# Patient Record
Sex: Male | Born: 1995 | Race: White | Hispanic: No | Marital: Single | Smoking: Never smoker
Health system: Southern US, Community
[De-identification: ages and names within clinical notes are randomized; demographics above are authoritative.]

## PROBLEM LIST (undated history)

## (undated) ENCOUNTER — Emergency Department: Discharge status: Absent without leave | Payer: BC Managed Care – PPO

## (undated) DIAGNOSIS — F329 Major depressive disorder, single episode, unspecified: Secondary | ICD-10-CM

## (undated) DIAGNOSIS — L709 Acne, unspecified: Secondary | ICD-10-CM

## (undated) DIAGNOSIS — F32A Depression, unspecified: Secondary | ICD-10-CM

## (undated) HISTORY — DX: Major depressive disorder, single episode, unspecified: F32.9

## (undated) HISTORY — DX: Depression, unspecified: F32.A

## (undated) HISTORY — DX: Acne, unspecified: L70.9

---
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
## 2016-08-29 NOTE — Progress Notes (Signed)
Patient ID: Kevin Ramos, male   DOB: Mar 09, 1996, 21 y.o.   MRN: [id number]            Referring physician: None  Chief complaint: Low calcium  History of Present Illness:  He has had symptoms for about 15 years of his fingers and toes getting numb and hands getting tingly along with marked cramping in his fingers and toes.  Also his eyes would be twitching He was evaluated at [facility] and was found to have low calcium levels, high phosphorus levels and very high parathyroid hormone levels as high as about 1500 Lowest calcium on record is 6.8 done in 2007 Also has had a tendency to high alkaline phosphate levels, this was 376 in 2007 and most recently 151  Although the patient has improvement in symptoms with taking calcitriol and calcium supplements he does not think that these supplements help his symptoms completely and because of lack of consistent relief he does not take his medication regularly He has not taken any calcitriol for the last 1-1/2 weeks When he does take his calcium tablets he takes them both together, does feel they make him choke sometimes  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 Depression     No past surgical history on file.  Family History  Problem Relation Age of Onset  . Hypoparathyroidism Mother     Social History:  reports that he has never smoked. He has never used smokeless tobacco. His alcohol and drug histories are not on file.   Allergies: No Known  Allergies  Allergies as of 08/30/2016   No Known Allergies     Medication List       Accurate as of 08/30/16 12:05 PM. Always use your most recent med list.          calcitRIOL 0.5 MCG capsule Commonly known as:  ROCALTROL Take by mouth.   CALCIUM 1200 PO Take 3 capsules by mouth daily.   citalopram 40 MG tablet Commonly known as:  CELEXA Take 40 mg by mouth daily.   TYLENOL ARTHRITIS PAIN 650 MG CR tablet Generic drug:  acetaminophen Take 1,300 mg by mouth every 8 (eight) hours as needed for pain.       LABS:        Review of Systems  Constitutional: Negative for weight gain.  HENT: Negative for trouble swallowing.   Gastrointestinal: Positive for constipation. Negative for abdominal pain.  Endocrine: Positive for fatigue.  Musculoskeletal: Positive for muscle aches and muscle cramps.  Psychiatric/Behavioral: Positive for depressed mood, nervousness and insomnia.     PHYSICAL EXAM:  BP 100/74   Pulse 79   Ht 5' 7.5" (1.715 m)   Wt 141 lb 3.2 oz (64 kg)   SpO2 98%   BMI 21.79 kg/m   GENERAL:  Well-built and nourished, pleasant and cooperative   No pallor, clubbing   Skin:  no rash   He has normal male secondary sexual characteristics  EYES:  Externally normal.  Fundii:  Not indicated  ENT: Oral mucosa and tongue normal.  THYROID:  Not palpable.  No lymphadenopathy in the neck  HEART:  Normal  S1 and S2; no murmur or click.  CHEST:  Normal shape.  Lungs: Vescicular breath sounds heard equally.  No crepitations/ wheeze.  ABDOMEN:  No distention.  Liver and spleen not palpable.  No other mass or tenderness.  NEUROLOGICAL: .Reflexes are difficult to elicit, normal bilaterally at ankles.  Chvostek sign significant positive Trousseau sign positive  JOINTS:  Normal.  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 Positive family history of the same  condition in his mother Symptoms have not been consistently controlled and calcium has not been normalized with previous treatment He has been somewhat erratic with his treatment and follow-up and continues to have symptoms although these are intermittent  FATIGUE: Not clear of this is related to hypocalcemia, concomitant insomnia and depression No recent thyroid levels available  HYPOKALEMIA: He had a potassium of 3.4 last year, not further evaluated and etiology unclear   PLAN:    Recheck chemistry panel today including calcium and potassium  Check magnesium level  Check vitamin D level  Check thyroid levels  He will increase his calcitriol to 5 capsules of the 0.5 g instead of 3 capsules, emphasized the need to take this every single day consistently to provide relief from his symptoms and normalize calcium with benefits of normal bone health long-term also  He will take to calcium tablets twice a day  Treatment will be adjusted based on his labs today  Follow-up in 3 weeks with repeat metabolic panel    Vaida Kerchner 08/30/2016, 12:05 PM

## 2016-08-30 ENCOUNTER — Encounter: Payer: Self-pay | Admitting: Endocrinology

## 2016-08-30 ENCOUNTER — Telehealth: Payer: Self-pay

## 2016-08-30 ENCOUNTER — Ambulatory Visit (INDEPENDENT_AMBULATORY_CARE_PROVIDER_SITE_OTHER): Payer: BC Managed Care – PPO | Admitting: Endocrinology

## 2016-08-30 VITALS — BP 100/74 | HR 79 | Ht 67.5 in | Wt 141.2 lb

## 2016-08-30 DIAGNOSIS — E201 Pseudohypoparathyroidism: Secondary | ICD-10-CM | POA: Diagnosis not present

## 2016-08-30 DIAGNOSIS — E876 Hypokalemia: Secondary | ICD-10-CM

## 2016-08-30 DIAGNOSIS — R5383 Other fatigue: Secondary | ICD-10-CM

## 2016-08-30 LAB — VITAMIN D 25 HYDROXY (VIT D DEFICIENCY, FRACTURES): VITD: 17.83 ng/mL — ABNORMAL LOW (ref 30.00–100.00)

## 2016-08-30 LAB — COMPREHENSIVE METABOLIC PANEL
ALT: 13 U/L (ref 0–53)
AST: 15 U/L (ref 0–37)
Albumin: 4.7 g/dL (ref 3.5–5.2)
Alkaline Phosphatase: 159 U/L — ABNORMAL HIGH (ref 39–117)
BUN: 7 mg/dL (ref 6–23)
CHLORIDE: 97 meq/L (ref 96–112)
CO2: 33 mEq/L — ABNORMAL HIGH (ref 19–32)
CREATININE: 0.76 mg/dL (ref 0.40–1.50)
Calcium: 7.5 mg/dL — ABNORMAL LOW (ref 8.4–10.5)
GFR: 134.98 mL/min (ref >60.00)
Glucose, Bld: 90 mg/dL (ref 70–99)
POTASSIUM: 3.4 meq/L — AB (ref 3.5–5.1)
Sodium: 139 mEq/L (ref 135–145)
Total Bilirubin: 1.4 mg/dL — ABNORMAL HIGH (ref 0.2–1.2)
Total Protein: 7.6 g/dL (ref 6.0–8.3)

## 2016-08-30 LAB — MAGNESIUM: MAGNESIUM: 2 mg/dL (ref 1.5–2.5)

## 2016-08-30 LAB — T4, FREE: Free T4: 0.84 ng/dL (ref 0.60–1.60)

## 2016-08-30 LAB — TSH: TSH: 3.29 u[IU]/mL (ref 0.35–4.50)

## 2016-08-30 NOTE — Telephone Encounter (Signed)
[facility] to have records faxed to [place] at [phone]. No answer so faxed request.

## 2016-08-30 NOTE — Progress Notes (Signed)
Please call to let patient know that the calcium is 7.5, he can try taking 4 capsules of calcitriol 0.5 g daily, we can send new prescription if needed Also needs to take 2 tablets of calcium twice a day as directed Vitamin D level was very low and we need to prescribe 50,000 units of vitamin D weekly Also need to start Klor-Con 10 mEq daily for low potassium Thyroid and magnesium okay

## 2016-08-30 NOTE — Patient Instructions (Signed)
Calcium: 2 tablets twice a day  CALCITRIOL: Take 5 capsules daily either at once or in split doses  Make sure you get plenty of day products in your diet

## 2016-08-31 ENCOUNTER — Telehealth: Payer: Self-pay | Admitting: Endocrinology

## 2016-08-31 NOTE — Telephone Encounter (Signed)
Patient returning the call about lab results. Please call patient back at [phone] to advise.

## 2016-09-03 ENCOUNTER — Other Ambulatory Visit: Payer: Self-pay

## 2016-09-03 ENCOUNTER — Other Ambulatory Visit: Payer: Self-pay | Admitting: Endocrinology

## 2016-09-03 MED ORDER — CALCITRIOL 0.5 MCG PO CAPS: ORAL_CAPSULE | ORAL | 1 refills | Status: DC

## 2016-09-03 MED ORDER — VITAMIN D (ERGOCALCIFEROL) 1.25 MG (50000 UNIT) PO CAPS: 50000.0000 [IU] | ORAL_CAPSULE | ORAL | 1 refills | Status: DC

## 2016-09-03 MED ORDER — POTASSIUM CHLORIDE ER 10 MEQ PO TBCR: 10.0000 meq | EXTENDED_RELEASE_TABLET | Freq: Every day | ORAL | 2 refills | Status: DC

## 2016-09-03 NOTE — Telephone Encounter (Signed)
Called patient and gave him lab results and sent in his new prescriptions to his pharmacy.

## 2016-09-05 ENCOUNTER — Other Ambulatory Visit: Payer: Self-pay

## 2016-09-05 MED ORDER — VITAMIN D (ERGOCALCIFEROL) 1.25 MG (50000 UNIT) PO CAPS: 50000.0000 [IU] | ORAL_CAPSULE | ORAL | 1 refills | Status: DC

## 2016-09-20 ENCOUNTER — Other Ambulatory Visit (INDEPENDENT_AMBULATORY_CARE_PROVIDER_SITE_OTHER): Payer: BC Managed Care – PPO

## 2016-09-20 DIAGNOSIS — E201 Pseudohypoparathyroidism: Secondary | ICD-10-CM

## 2016-09-20 LAB — BASIC METABOLIC PANEL
BUN: 9 mg/dL (ref 6–23)
CALCIUM: 9.4 mg/dL (ref 8.4–10.5)
CO2: 30 meq/L (ref 19–32)
Chloride: 100 mEq/L (ref 96–112)
Creatinine, Ser: 0.9 mg/dL (ref 0.40–1.50)
GFR: 111 mL/min (ref >60.00)
GLUCOSE: 96 mg/dL (ref 70–99)
POTASSIUM: 3.6 meq/L (ref 3.5–5.1)
SODIUM: 138 meq/L (ref 135–145)

## 2016-09-25 ENCOUNTER — Ambulatory Visit (INDEPENDENT_AMBULATORY_CARE_PROVIDER_SITE_OTHER): Payer: BC Managed Care – PPO | Admitting: Endocrinology

## 2016-09-25 ENCOUNTER — Encounter: Payer: Self-pay | Admitting: Endocrinology

## 2016-09-25 VITALS — BP 102/68 | HR 92 | Ht 67.5 in | Wt 148.4 lb

## 2016-09-25 DIAGNOSIS — E201 Pseudohypoparathyroidism: Secondary | ICD-10-CM | POA: Diagnosis not present

## 2016-09-25 NOTE — Patient Instructions (Signed)
1 calcium am and 2 at dinner  4 Calcitriol daily

## 2016-09-25 NOTE — Progress Notes (Signed)
Patient ID: Kevin Ramos, male   DOB: 01-04-1996, 21 y.o.   MRN: [id number]             Chief complaint: Low calcium, follow-up  History of Present Illness:  Initial history obtained in 6/18: He has had symptoms for about 15 years of his fingers and toes getting numb and hands getting tingly along with marked cramping in his fingers and toes.  Also his eyes would be twitching He was evaluated at [facility] and was found to have low calcium levels, high phosphorus levels and very high parathyroid hormone levels as high as about 1500 Lowest calcium on record is 6.8 done in 2007 Also has had a tendency to high alkaline phosphate levels, this was 376 in 2007 and most recently 151 Although the patient has improvement in symptoms with taking calcitriol and calcium supplements he does not think that these supplements help his symptoms completely and because of lack of consistent relief he does not take his medication regularly He has not taken any calcitriol for the last 1-1/2 weeks When he does take his calcium tablets he takes them both together, does feel they make him choke sometimes 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 Usually his symptoms appear to be intermittent Also he complains of feeling exhausted and getting more tired at times  His last calcium was 7.1 done in 10/17 by his PCP Highest calcium has been 8.3 in 2012, but he took twice the prescribed amount of calcitriol the night before  RECENT history:  At his initial consultation he was recommended that he try to take adequate doses of calcitriol and calcium consistently to help control hypocalcemia and explained to him that because of his relatively severe condition he needs to take larger than usual doses of both calcium and calcitriol regularly Also he was found to have vitamin D deficiency and started on vitamin D supplements 50,000  units weekly  Although he was told to take 5 calcitriol capsules he is taking only 4 but also has increased his calcium to 2 tablets twice a day without difficulty More recently he says that he has not had any cramping, stiffness, weakness or physical exhaustion even when he has been walking at times He does have more significant depression recently which makes him tired  Calcium is now improved back to normal  Lab Results  Component Value Date   CALCIUM 9.4 09/20/2016     Lab Results  Component Value Date   K 3.6 09/20/2016     Lab Results  Component Value Date   VD25OH 17.83 (L) 08/30/2016     Past Medical History:  Diagnosis Date  . Depression     No past surgical history on file.  Family History  Problem Relation Age of Onset  . Hypoparathyroidism Mother     Social History:  reports that he has never smoked. He has never used smokeless tobacco. He reports that he does not use drugs. His alcohol history is not on file.   Allergies: No Known Allergies  Allergies as of 09/25/2016   No Known Allergies     Medication List       Accurate as of 09/25/16  4:16 PM. Always use your most recent med list.          buPROPion 150 MG 24 hr tablet Commonly known as:  WELLBUTRIN XL Take 150 mg  by mouth daily.   calcitRIOL 0.5 MCG capsule Commonly known as:  ROCALTROL Take 4 capsules of calcitriol 0.5 g daily   CALCIUM 1200 PO Take 4 capsules by mouth daily.   cetirizine 10 MG tablet Commonly known as:  ZYRTEC Take 10 mg by mouth daily.   potassium chloride 10 MEQ tablet Commonly known as:  KLOR-CON 10 Take 1 tablet (10 mEq total) by mouth daily.   traZODone 50 MG tablet Commonly known as:  DESYREL Take 100 mg by mouth at bedtime.   TYLENOL ARTHRITIS PAIN 650 MG CR tablet Generic drug:  acetaminophen Take 1,300 mg by mouth every 8 (eight) hours as needed for pain.   Vitamin D (Ergocalciferol) 50000 units Caps capsule Commonly known as:  DRISDOL Take 1  capsule (50,000 Units total) by mouth every 7 (seven) days.              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  Patient symptoms and calcium levels are now currently well controlled with taking calcitriol for capsules daily along with increased calcium tablets Also started on vitamin D level separately because of baseline level of 17 He seems to be more motivated to take his supplements and calcium regularly now  FATIGUE: More likely related to depression  HYPOKALEMIA: He had a potassium of 3.4 and with supplementation this is improved, no etiology clear for this   PLAN:    Continue same regimen except he can cut back calcium to 3 tablets, calcium level of about 9.0 would be ideal  Continue vitamin D and potassium supplements  Call if having recurrence of symptoms  Follow-up in 6-8 weeks with repeat metabolic panel    Walterine Amodei 09/25/2016, 4:16 PM

## 2016-11-05 ENCOUNTER — Other Ambulatory Visit: Payer: Self-pay | Admitting: Endocrinology

## 2016-11-22 ENCOUNTER — Other Ambulatory Visit (INDEPENDENT_AMBULATORY_CARE_PROVIDER_SITE_OTHER): Payer: BC Managed Care – PPO

## 2016-11-22 DIAGNOSIS — E201 Pseudohypoparathyroidism: Secondary | ICD-10-CM | POA: Diagnosis not present

## 2016-11-22 LAB — BASIC METABOLIC PANEL
BUN: 12 mg/dL (ref 6–23)
CALCIUM: 11 mg/dL — AB (ref 8.4–10.5)
CO2: 34 meq/L — AB (ref 19–32)
CREATININE: 1.11 mg/dL (ref 0.40–1.50)
Chloride: 102 mEq/L (ref 96–112)
GFR: 87.01 mL/min (ref >60.00)
Glucose, Bld: 97 mg/dL (ref 70–99)
Potassium: 4 mEq/L (ref 3.5–5.1)
Sodium: 140 mEq/L (ref 135–145)

## 2016-11-23 DIAGNOSIS — Z206 Contact with and (suspected) exposure to human immunodeficiency virus [HIV]: Secondary | ICD-10-CM | POA: Insufficient documentation

## 2016-11-28 ENCOUNTER — Encounter: Payer: Self-pay | Admitting: Endocrinology

## 2016-11-28 ENCOUNTER — Ambulatory Visit (INDEPENDENT_AMBULATORY_CARE_PROVIDER_SITE_OTHER): Payer: BC Managed Care – PPO | Admitting: Endocrinology

## 2016-11-28 VITALS — BP 108/72 | HR 96 | Ht 67.5 in | Wt 161.8 lb

## 2016-11-28 DIAGNOSIS — E559 Vitamin D deficiency, unspecified: Secondary | ICD-10-CM | POA: Diagnosis not present

## 2016-11-28 DIAGNOSIS — E201 Pseudohypoparathyroidism: Secondary | ICD-10-CM

## 2016-11-28 NOTE — Patient Instructions (Addendum)
Take 2 each of calcitriol and calcium  Skip tomorrow

## 2016-11-28 NOTE — Progress Notes (Signed)
Patient ID: Kevin Ramos, male   DOB: [birth year], 21 y.o.   MRN: [id number]             Chief complaint: Low calcium, follow-up  History of Present Illness:  Initial history obtained in 6/18: He has had symptoms for about 15 years of his fingers and toes getting numb and hands getting tingly along with marked cramping in his fingers and toes.  Also his eyes would be twitching He was evaluated at [facility] and was found to have low calcium levels, high phosphorus levels and very high parathyroid hormone levels as high as about 1500 Lowest calcium on record is 6.8 done in 2007 Also has had a tendency to high alkaline phosphate levels, this was 376 in 2007 and most recently 151 Although the patient has improvement in symptoms with taking calcitriol and calcium supplements he does not think that these supplements help his symptoms completely and because of lack of consistent relief he does not take his medication regularly He has not taken any calcitriol for the last 1-1/2 weeks When he does take his calcium tablets he takes them both together, does feel they make him choke sometimes 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 Usually his symptoms appear to be intermittent Also he complains of feeling exhausted and getting more tired at times  His last calcium was 7.1 done in 10/17 by his PCP Highest calcium has been 8.3 in 2012, but he took twice the prescribed amount of calcitriol the night before  RECENT history:  At his initial consultation he was recommended that he try to take adequate doses of calcitriol and calcium consistently to help control hypocalcemia and explained to him that because of his relatively severe condition he needs to take larger than usual doses of both calcium and calcitriol regularly Also he was found to have vitamin D deficiency and started on vitamin D supplements 50,000  units weekly  Although he was told to take 5 calcitriol capsules he is taking only Currently is taking calcitriol regularly, now taking 4 capsules daily in the morning  He also has increased his calcium to 3 He has done very well with being compliant with all his regimen of taking medications  He has no further cramping, stiffness, weakness or extreme weakness that he had before He does have better energy after adjusting his psychiatric medications  Calcium is now markedly increased at 11.0 compared to his previous levels He does not feel nauseous    Lab Results  Component Value Date   CALCIUM 11.0 (H) 11/22/2016   CALCIUM 9.4 09/20/2016   CALCIUM 7.5 (L) 08/30/2016     He was also supplemented with potassium because of relatively low levels  Lab Results  Component Value Date   K 4.0 11/22/2016   VITAMIN D deficiency: He is taking 50,000 units weekly since June  Lab Results  Component Value Date   VD25OH 17.83 (L) 08/30/2016     Past Medical History:  Diagnosis Date  . Depression     No past surgical history on file.  Family History  Problem Relation Age of Onset  . Hypoparathyroidism Mother     Social History:  reports that he has never smoked. He has never used smokeless tobacco. He reports that he does not use drugs. His alcohol history is not on file.   Allergies: No Known Allergies  Allergies  as of 11/28/2016   No Known Allergies     Medication List       Accurate as of 11/28/16  3:43 PM. Always use your most recent med list.          calcitRIOL 0.5 MCG capsule Commonly known as:  ROCALTROL TAKE FOUR CAPSULES BY MOUTH DAILY   CALCIUM 1200 PO Take 4 capsules by mouth daily.   cetirizine 10 MG tablet Commonly known as:  ZYRTEC Take 10 mg by mouth daily.   citalopram 40 MG tablet Commonly known as:  CELEXA Take by mouth.   Doxycycline Hyclate 50 MG Tabs TAKE ONE tablet by MOUTH ONCE daily, TAKE WITH food AND lots OF IN FLUID     lamoTRIgine 25 MG tablet Commonly known as:  LAMICTAL   potassium chloride 10 MEQ tablet Commonly known as:  KLOR-CON 10 Take 1 tablet (10 mEq total) by mouth daily.   traZODone 50 MG tablet Commonly known as:  DESYREL Take 100 mg by mouth at bedtime.   TRUVADA 200-300 MG tablet Generic drug:  emtricitabine-tenofovir   TYLENOL ARTHRITIS PAIN 650 MG CR tablet Generic drug:  acetaminophen Take 1,300 mg by mouth every 8 (eight) hours as needed for pain.   Vitamin D (Ergocalciferol) 50000 units Caps capsule Commonly known as:  DRISDOL Take 1 capsule (50,000 Units total) by mouth every 7 (seven) days.              Review of Systems  Neurological: Positive for tingling.   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  Patient  His symptoms have resolved with taking his calcitriol and calcium regularly and he is much more motivated to be compliant with this He is also on vitamin D supplementation  Surprisingly his calcium level was increased from 9.4 up to 11 Currently does not have any symptoms of hypercalcemia   HYPOKALEMIA: He had a potassium of 3.4 and with supplementation this is improved   PLAN:    He will reduce calcitriol down 50% and take only 2 capsules daily instead of before  Also reduce calcium supplements to 2 daily  Will check his levels again in one month and also recheck vitamin D levels  He will call if he has any recurrence of hypocalcemia and symptoms Continue potassium supplements for now   [facility] 11/28/2016, 3:43 PM

## 2016-12-02 ENCOUNTER — Other Ambulatory Visit: Payer: Self-pay | Admitting: Endocrinology

## 2016-12-27 ENCOUNTER — Other Ambulatory Visit (INDEPENDENT_AMBULATORY_CARE_PROVIDER_SITE_OTHER): Payer: BC Managed Care – PPO

## 2016-12-27 DIAGNOSIS — E559 Vitamin D deficiency, unspecified: Secondary | ICD-10-CM | POA: Diagnosis not present

## 2016-12-27 DIAGNOSIS — E201 Pseudohypoparathyroidism: Secondary | ICD-10-CM | POA: Diagnosis not present

## 2016-12-27 LAB — VITAMIN D 25 HYDROXY (VIT D DEFICIENCY, FRACTURES): VITD: 41.22 ng/mL (ref 30.00–100.00)

## 2016-12-27 LAB — RENAL FUNCTION PANEL
Albumin: 4.4 g/dL (ref 3.5–5.2)
BUN: 12 mg/dL (ref 6–23)
CHLORIDE: 102 meq/L (ref 96–112)
CO2: 31 mEq/L (ref 19–32)
Calcium: 9.9 mg/dL (ref 8.4–10.5)
Creatinine, Ser: 1.06 mg/dL (ref 0.40–1.50)
GFR: 91.69 mL/min (ref >60.00)
GLUCOSE: 91 mg/dL (ref 70–99)
PHOSPHORUS: 2.9 mg/dL (ref 2.3–4.6)
POTASSIUM: 4.1 meq/L (ref 3.5–5.1)
SODIUM: 139 meq/L (ref 135–145)

## 2016-12-31 ENCOUNTER — Ambulatory Visit (INDEPENDENT_AMBULATORY_CARE_PROVIDER_SITE_OTHER): Payer: BC Managed Care – PPO | Admitting: Endocrinology

## 2016-12-31 ENCOUNTER — Encounter: Payer: Self-pay | Admitting: Endocrinology

## 2016-12-31 VITALS — BP 116/74 | HR 94 | Ht 67.5 in | Wt 165.6 lb

## 2016-12-31 DIAGNOSIS — E201 Pseudohypoparathyroidism: Secondary | ICD-10-CM

## 2016-12-31 NOTE — Patient Instructions (Addendum)
Stop Potassium   Vit. D 2x per month

## 2016-12-31 NOTE — Progress Notes (Signed)
Patient ID: Kevin Ramos, male   DOB: January 07, 1996, 21 y.o.   MRN: [id number]             Chief complaint: Low calcium, follow-up  History of Present Illness:  Initial history obtained in 6/18: He has had symptoms for about 15 years of his fingers and toes getting numb and hands getting tingly along with marked cramping in his fingers and toes.  Also his eyes would be twitching He was evaluated at [facility] and was found to have low calcium levels, high phosphorus levels and very high parathyroid hormone levels as high as about 1500 Lowest calcium on record is 6.8 done in 2007 Also has had a tendency to high alkaline phosphate levels, this was 376 in 2007 and most recently 151 Although the patient has improvement in symptoms with taking calcitriol and calcium supplements he does not think that these supplements help his symptoms completely and because of lack of consistent relief he does not take his medication regularly He has not taken any calcitriol for the last 1-1/2 weeks When he does take his calcium tablets he takes them both together, does feel they make him choke sometimes 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 Usually his symptoms appear to be intermittent Also he complains of feeling exhausted and getting more tired at times  His last calcium was 7.1 done in 10/17 by his PCP Highest calcium has been 8.3 in 2012, but he took twice the prescribed amount of calcitriol the night before  RECENT history:  At his initial consultation he was recommended that he try to take adequate doses of calcitriol and calcium consistently to help control hypocalcemia and explained to him that because of his relatively severe condition he needs to take larger than usual doses of both calcium and calcitriol regularly Also he was found to have vitamin D deficiency and started on vitamin D supplements 50,000  units weekly  With starting more regular supplementation his calcium levels improved and in August the level was up to 11 His dose was cut down by 50% and he is  taking a lower dose of calcium also  Recently is taking calcitriol regularly every day, now taking 2 capsules daily in the morning  He also has been taking calcium twice a day He has done very well with being compliant with all his regimen of taking medications  He has no further cramping, stiffness, weakness or extreme weakness that he had before He may have little tingling in his hands if he is lying on the  He does have better energy overall but still having issues with his depression  Calcium is now normal at 9.9    Lab Results  Component Value Date   CALCIUM 9.9 12/27/2016   CALCIUM 11.0 (H) 11/22/2016   CALCIUM 9.4 09/20/2016     He also is being supplemented with potassium because of relatively low levels, No history of hypomagnesemia   Lab Results  Component Value Date   K 4.1 12/27/2016    VITAMIN D deficiency: He is taking 50,000 units weekly since June  Lab Results  Component Value Date   VD25OH 41.22 12/27/2016   VD25OH 17.83 (L) 08/30/2016     Past Medical History:  Diagnosis Date  . Depression     No past surgical history on file.  Family History  Problem Relation Age of Onset  .  Hypoparathyroidism Mother     Social History:  reports that he has never smoked. He has never used smokeless tobacco. He reports that he does not use drugs. His alcohol history is not on file.   Allergies: No Known Allergies  Allergies as of 12/31/2016   No Known Allergies     Medication List       Accurate as of 12/31/16 11:45 AM. Always use your most recent med list.          calcitRIOL 0.5 MCG capsule Commonly known as:  ROCALTROL Take 2 capsules daily   CALCIUM 1200 PO Take 4 capsules by mouth daily.   cetirizine 10 MG tablet Commonly known as:  ZYRTEC Take 10 mg by mouth daily.     citalopram 40 MG tablet Commonly known as:  CELEXA Take by mouth.   Doxycycline Hyclate 50 MG Tabs TAKE ONE tablet by MOUTH ONCE daily, TAKE WITH food AND lots OF IN FLUID   lamoTRIgine 150 MG tablet Commonly known as:  LAMICTAL Take 150 mg by mouth daily.   potassium chloride 10 MEQ tablet Commonly known as:  K-DUR TAKE ONE TABLET BY MOUTH DAILY   traZODone 50 MG tablet Commonly known as:  DESYREL Take 100 mg by mouth at bedtime.   TRUVADA 200-300 MG tablet Generic drug:  emtricitabine-tenofovir   TYLENOL ARTHRITIS PAIN 650 MG CR tablet Generic drug:  acetaminophen Take 1,300 mg by mouth every 8 (eight) hours as needed for pain.   Vitamin D (Ergocalciferol) 50000 units Caps capsule Commonly known as:  DRISDOL Take 1 capsule (50,000 Units total) by mouth every 7 (seven) days.              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

## 2017-03-29 ENCOUNTER — Other Ambulatory Visit: Payer: Self-pay | Admitting: Endocrinology

## 2017-05-03 ENCOUNTER — Other Ambulatory Visit: Payer: BC Managed Care – PPO

## 2017-05-07 ENCOUNTER — Ambulatory Visit: Payer: BC Managed Care – PPO | Admitting: Endocrinology

## 2017-10-29 ENCOUNTER — Encounter: Payer: Self-pay | Admitting: Emergency Medicine

## 2017-10-29 ENCOUNTER — Inpatient Hospital Stay
Admission: AD | Admit: 2017-10-29 | Discharge: 2017-10-31 | DRG: 885 | Disposition: A | Discharge status: Home / Self Care | Payer: BC Managed Care – PPO | Source: Intra-hospital | Attending: Psychiatry | Admitting: Psychiatry

## 2017-10-29 ENCOUNTER — Emergency Department
Admission: EM | Admit: 2017-10-29 | Discharge: 2017-10-29 | Disposition: A | Discharge status: Home / Self Care | Payer: BC Managed Care – PPO | Attending: Emergency Medicine | Admitting: Emergency Medicine

## 2017-10-29 ENCOUNTER — Other Ambulatory Visit: Payer: Self-pay

## 2017-10-29 DIAGNOSIS — E201 Pseudohypoparathyroidism: Secondary | ICD-10-CM | POA: Diagnosis present

## 2017-10-29 DIAGNOSIS — Z818 Family history of other mental and behavioral disorders: Secondary | ICD-10-CM | POA: Diagnosis not present

## 2017-10-29 DIAGNOSIS — F332 Major depressive disorder, recurrent severe without psychotic features: Secondary | ICD-10-CM | POA: Diagnosis present

## 2017-10-29 DIAGNOSIS — Z91199 Patient's noncompliance with other medical treatment and regimen due to unspecified reason: Secondary | ICD-10-CM

## 2017-10-29 DIAGNOSIS — Z79899 Other long term (current) drug therapy: Secondary | ICD-10-CM | POA: Insufficient documentation

## 2017-10-29 DIAGNOSIS — Z9119 Patient's noncompliance with other medical treatment and regimen: Secondary | ICD-10-CM | POA: Diagnosis not present

## 2017-10-29 DIAGNOSIS — F329 Major depressive disorder, single episode, unspecified: Secondary | ICD-10-CM | POA: Insufficient documentation

## 2017-10-29 DIAGNOSIS — F32A Depression, unspecified: Secondary | ICD-10-CM

## 2017-10-29 DIAGNOSIS — R45851 Suicidal ideations: Secondary | ICD-10-CM | POA: Insufficient documentation

## 2017-10-29 DIAGNOSIS — F410 Panic disorder [episodic paroxysmal anxiety] without agoraphobia: Secondary | ICD-10-CM | POA: Diagnosis present

## 2017-10-29 LAB — URINE DRUG SCREEN, QUALITATIVE (ARMC ONLY)
AMPHETAMINES, UR SCREEN: NOT DETECTED
Barbiturates, Ur Screen: NOT DETECTED
COCAINE METABOLITE, UR ~~LOC~~: NOT DETECTED
Cannabinoid 50 Ng, Ur ~~LOC~~: NOT DETECTED
MDMA (ECSTASY) UR SCREEN: NOT DETECTED
Methadone Scn, Ur: NOT DETECTED
Opiate, Ur Screen: NOT DETECTED
PHENCYCLIDINE (PCP) UR S: NOT DETECTED
Tricyclic, Ur Screen: NOT DETECTED

## 2017-10-29 LAB — COMPREHENSIVE METABOLIC PANEL
ALBUMIN: 4.7 g/dL (ref 3.5–5.0)
ALK PHOS: 166 U/L — AB (ref 38–126)
ALT: 20 U/L (ref 0–44)
AST: 27 U/L (ref 15–41)
Anion gap: 10 (ref 5–15)
BILIRUBIN TOTAL: 1.3 mg/dL — AB (ref 0.3–1.2)
BUN: 6 mg/dL (ref 6–20)
CO2: 31 mmol/L (ref 22–32)
Calcium: 7.5 mg/dL — ABNORMAL LOW (ref 8.9–10.3)
Chloride: 98 mmol/L (ref 98–111)
Creatinine, Ser: 0.71 mg/dL (ref 0.61–1.24)
GFR calc Af Amer: >60 mL/min (ref >60)
GFR calc non Af Amer: >60 mL/min (ref >60)
GLUCOSE: 80 mg/dL (ref 70–99)
POTASSIUM: 2.8 mmol/L — AB (ref 3.5–5.1)
Sodium: 139 mmol/L (ref 135–145)
TOTAL PROTEIN: 8.1 g/dL (ref 6.5–8.1)

## 2017-10-29 LAB — CBC
HEMATOCRIT: 51.6 % (ref 40.0–52.0)
Hemoglobin: 18.1 g/dL — ABNORMAL HIGH (ref 13.0–18.0)
MCH: 30.2 pg (ref 26.0–34.0)
MCHC: 35 g/dL (ref 32.0–36.0)
MCV: 86.2 fL (ref 80.0–100.0)
Platelets: 189 10*3/uL (ref 150–440)
RBC: 5.99 MIL/uL — AB (ref 4.40–5.90)
RDW: 13.1 % (ref 11.5–14.5)
WBC: 7.4 10*3/uL (ref 3.8–10.6)

## 2017-10-29 LAB — SALICYLATE LEVEL: Salicylate Lvl: <7 mg/dL (ref 2.8–30.0)

## 2017-10-29 LAB — ACETAMINOPHEN LEVEL: Acetaminophen (Tylenol), Serum: <10 ug/mL — ABNORMAL LOW (ref 10–30)

## 2017-10-29 LAB — ETHANOL: Alcohol, Ethyl (B): <10 mg/dL (ref <10)

## 2017-10-29 MED ORDER — CARBAMAZEPINE 200 MG PO TABS
200.0000 mg | ORAL_TABLET | Freq: Two times a day (BID) | ORAL | Status: DC
  Administered 2017-10-29 – 2017-10-30 (×2): 200 mg via ORAL
  Filled 2017-10-29 (×2): qty 1

## 2017-10-29 MED ORDER — POTASSIUM CHLORIDE CRYS ER 20 MEQ PO TBCR
40.0000 meq | EXTENDED_RELEASE_TABLET | Freq: Once | ORAL | Status: AC
  Administered 2017-10-29: 40 meq via ORAL
  Filled 2017-10-29: qty 2

## 2017-10-29 MED ORDER — CALCIUM CARBONATE-VITAMIN D 500-200 MG-UNIT PO TABS
2.0000 | ORAL_TABLET | Freq: Every day | ORAL | Status: DC
  Administered 2017-10-30 – 2017-10-31 (×2): 2 via ORAL
  Filled 2017-10-29 (×2): qty 2

## 2017-10-29 MED ORDER — ALUM & MAG HYDROXIDE-SIMETH 200-200-20 MG/5ML PO SUSP: 30.0000 mL | ORAL | Status: DC | PRN

## 2017-10-29 MED ORDER — ARIPIPRAZOLE 5 MG PO TABS
5.0000 mg | ORAL_TABLET | Freq: Every day | ORAL | Status: DC
  Administered 2017-10-30: 5 mg via ORAL
  Filled 2017-10-29: qty 1

## 2017-10-29 MED ORDER — HYDROXYZINE HCL 25 MG PO TABS: 25.0000 mg | ORAL_TABLET | Freq: Three times a day (TID) | ORAL | Status: DC | PRN

## 2017-10-29 MED ORDER — VENLAFAXINE HCL ER 75 MG PO CP24
150.0000 mg | ORAL_CAPSULE | Freq: Every day | ORAL | Status: DC
  Administered 2017-10-30: 150 mg via ORAL
  Filled 2017-10-29: qty 2

## 2017-10-29 MED ORDER — TEMAZEPAM 15 MG PO CAPS: 15.0000 mg | ORAL_CAPSULE | Freq: Every day | ORAL | Status: DC

## 2017-10-29 MED ORDER — CALCITRIOL 0.25 MCG PO CAPS
0.5000 ug | ORAL_CAPSULE | Freq: Every day | ORAL | Status: DC
  Administered 2017-10-30 – 2017-10-31 (×2): 0.5 ug via ORAL
  Filled 2017-10-29 (×2): qty 2

## 2017-10-29 MED ORDER — ARIPIPRAZOLE 5 MG PO TABS: 5.0000 mg | ORAL_TABLET | Freq: Every day | ORAL | Status: DC

## 2017-10-29 MED ORDER — CARBAMAZEPINE 200 MG PO TABS: 200.0000 mg | ORAL_TABLET | Freq: Two times a day (BID) | ORAL | Status: DC

## 2017-10-29 MED ORDER — MAGNESIUM HYDROXIDE 400 MG/5ML PO SUSP: 30.0000 mL | Freq: Every day | ORAL | Status: DC | PRN

## 2017-10-29 MED ORDER — VITAMIN D (ERGOCALCIFEROL) 1.25 MG (50000 UNIT) PO CAPS
50000.0000 [IU] | ORAL_CAPSULE | ORAL | Status: DC
  Administered 2017-10-30: 50000 [IU] via ORAL
  Filled 2017-10-29: qty 1

## 2017-10-29 MED ORDER — TEMAZEPAM 15 MG PO CAPS
15.0000 mg | ORAL_CAPSULE | Freq: Every day | ORAL | Status: DC
  Administered 2017-10-29: 15 mg via ORAL
  Filled 2017-10-29: qty 1

## 2017-10-29 MED ORDER — ACETAMINOPHEN 325 MG PO TABS: 650.0000 mg | ORAL_TABLET | Freq: Four times a day (QID) | ORAL | Status: DC | PRN

## 2017-10-29 NOTE — ED Triage Notes (Signed)
States "I have been off of my meds for a while"  Meds: Lamictal and Celexa.  Patient states depression has been bad.  States he has been having thoughts of wanting to die.  No plan for action.  States anxiety has been elevated as well.

## 2017-10-29 NOTE — [facility] Group Notes (Signed)
[facility] Notes:  (Nursing/MHT/Case Management/Adjunct)  Date:  10/29/2017  Time:  10:33 PM  Type of Therapy:  Group Therapy  Participation Level:  Did Not Attend    Kevin Ramos 10/29/2017, 10:33 PM

## 2017-10-29 NOTE — ED Notes (Signed)
Report to include Situation, Background, Assessment, and Recommendations received from Jadeka RN. Patient alert and oriented, warm and dry, in no acute distress. Patient denies SI, HI, AVH and pain. Patient made aware of Q15 minute rounds and security cameras for their safety. Patient instructed to come to me with needs or concerns. 

## 2017-10-29 NOTE — ED Notes (Signed)
Patient dressed out in Triage. Belongings:   Wallet and mobile phone and jacket sent home with mother  In personal belonging bag: Maroon pair of flip flops Tan shorts Maroon Programmer, systems. One ear ring stud with clear stone.

## 2017-10-29 NOTE — ED Notes (Signed)
VOL  PENDING  PLACEMENT 

## 2017-10-29 NOTE — Consult Note (Signed)
[facility] Consult   Reason for Consult:  Suicidal ideation Referring Physician:  Dr. Mable Paris Patient Identification: Kevin Ramos MRN:  [id number] Principal Diagnosis: Major depressive disorder, recurrent severe without psychotic features [facility]) Diagnosis:   Patient Active Problem List   Diagnosis Date Noted  . Major depressive disorder, recurrent severe without psychotic features ([place]) [F33.2] 10/29/2017    Priority: High  . Suicidal ideation [R45.851] 10/29/2017  . 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 Kevin Ramos is a 22 year old male with a history of depression and anxiety.  Chief complaint. "I have not been myself."  History of present illness. Information was obtained from the patient, the chart, his former psychiatrist and the mother. The patient came to the ER complaining of worsening depression and suicidal ideation without a plan. Suicidal thoughts however became more frequent and intrusive. He also noticed mood swings and irritability. He has been getting angry with his parents with who he was vacationing this week. He reports that he responded well to a combination of Celexa and Lamictal started by Dr. Nicolasa Ducking while in college. Last spring however, he started taking his medications inconsistently and the he stopped altogether. He reports poor sleep, decreased appetite, anhedonia, feeling of guilt hopelessness worthlessness, poor energy and concentration, social isolation, crying spells, heightened anxiety and passing suicidal ideation. Denies psychotic symptoms. Endorses mood swings, irritability, hyperactivity. Feels more anxious especially at work when unexpected things happen. Denies drugs or alcohol.  Past psychiatric history. Diagnosed with depression while in college. No hospitalizations or suicide attempts.  Family psychiatric history. Mother with depression. 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 It is likely that he has not been compliant with his other medications.  Social history. Graduated from [employer] in acting. 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 Lives in [place] with two roommates and work in Scientist, research (life sciences) estate. Unfortunately, he has not been able to get any commissions this year and has been deepping into his savings. Came to [place] to spend time at [employer] with family but they noticed his depression. Will return to [place] in two weeks time.  Risk to Self: Suicidal Ideation: Yes-Currently Present Suicidal Intent: No Is patient at risk for suicide?: No Suicidal Plan?: No Access to Means: No What has been your use of drugs/alcohol within the last 12 months?: Reports of none How many times?: 0 Other Self Harm Risks: Reports of none Triggers for Past Attempts: None known Intentional Self Injurious Behavior: None Risk to Others: Homicidal Ideation: No Thoughts of Harm to Others: No Current Homicidal Intent: No Current Homicidal Plan: No Access to Homicidal Means: No Identified Victim: Reports of none History of harm to others?: No Assessment of Violence: None Noted Violent Behavior Description: Reports of none Does patient have access to weapons?: No Criminal Charges Pending?: No Does patient have a court date: No Prior Inpatient Therapy: Prior Inpatient Therapy: No Prior Outpatient Therapy: Prior Outpatient Therapy: Yes Prior Therapy Dates: 03/2017 Prior Therapy Facilty/Provider(s): Dr. Rufina Falco office) Reason for Treatment: Bipolar II Does patient have an ACCT team?: No Does patient have Intensive In-House Services?  : No Does patient have [facility] services? : No Does patient have P4CC services?: No  Past Medical History:  Past Medical History:  Diagnosis Date  . Depression    History reviewed. No pertinent surgical history. Family History:  Family History  Problem Relation Age of Onset  . Hypoparathyroidism Mother    Social History:  Social  History  Substance and Sexual Activity  Alcohol Use Not on file     Social History   Substance and Sexual Activity  Drug Use No    Social History   Socioeconomic History  . Marital status: Single    Spouse name: Not on file  . Number of children: Not on file  . Years of education: Not on file  . Highest education level: Not on file  Occupational History  . Not on file  Social Needs  . Financial resource strain: Not on file  . Food insecurity:    Worry: Not on file    Inability: Not on file  . Transportation needs:    Medical: Not on file    Non-medical: Not on file  Tobacco Use  . Smoking status: Never Smoker  . Smokeless tobacco: Never Used  Substance and Sexual Activity  . Alcohol use: Not on file  . Drug use: No  . Sexual activity: Not on file  Lifestyle  . Physical activity:    Days per week: Not on file    Minutes per session: Not on file  . Stress: Not on file  Relationships  . Social connections:    Talks on phone: Not on file    Gets together: Not on file    Attends religious service: Not on file    Active member of club or organization: Not on file    Attends meetings of clubs or organizations: Not on file    Relationship status: Not on file  Other Topics Concern  . Not on file  Social History Narrative  . Not on file   Additional Social History:    Allergies:  No Known Allergies  Labs:  Results for orders placed or performed during the hospital encounter of 10/29/17 (from the past 48 hour(s))  Comprehensive metabolic panel     Status: Abnormal   Collection Time: 10/29/17  1:56 PM  Result Value Ref Range   Sodium 139 135 - 145 mmol/L   Potassium 2.8 (L) 3.5 - 5.1 mmol/L   Chloride 98 98 - 111 mmol/L   CO2 31 22 - 32 mmol/L   Glucose, Bld 80 70 - 99 mg/dL   BUN 6 6 - 20 mg/dL   Creatinine, Ser 0.71 0.61 - 1.24 mg/dL   Calcium 7.5 (L) 8.9 - 10.3 mg/dL   Total Protein 8.1 6.5 - 8.1 g/dL   Albumin 4.7 3.5 - 5.0 g/dL   AST 27 15 - 41 U/L    ALT 20 0 - 44 U/L   Alkaline Phosphatase 166 (H) 38 - 126 U/L   Total Bilirubin 1.3 (H) 0.3 - 1.2 mg/dL   GFR calc non Af Amer >60 >60 mL/min   GFR calc Af Amer >60 >60 mL/min    Comment: (NOTE) The eGFR has been calculated using the CKD EPI equation. This calculation has not been validated in all clinical situations. eGFR's persistently <60 mL/min signify possible Chronic Kidney Disease.    Anion gap 10 5 - 15    Comment: Performed at [facility], [address] [place], [place] [address]  Ethanol     Status: None   Collection Time: 10/29/17  1:56 PM  Result Value Ref Range   Alcohol, Ethyl (B) <10 <10 mg/dL    Comment: (NOTE) Lowest detectable limit for serum alcohol is 10 mg/dL. For medical purposes only. Performed at [facility], [address] [place], [place] [address]   Salicylate level  Status: None   Collection Time: 10/29/17  1:56 PM  Result Value Ref Range   Salicylate Lvl <7.6 2.8 - 30.0 mg/dL    Comment: Performed at [facility], [address] [place], [place] [address]  Acetaminophen level     Status: Abnormal   Collection Time: 10/29/17  1:56 PM  Result Value Ref Range   Acetaminophen (Tylenol), Serum <10 (L) 10 - 30 ug/mL    Comment: (NOTE) Therapeutic concentrations vary significantly. A range of 10-30 ug/mL  may be an effective concentration for many patients. However, some  are best treated at concentrations outside of this range. Acetaminophen concentrations >150 ug/mL at 4 hours after ingestion  and >50 ug/mL at 12 hours after ingestion are often associated with  toxic reactions. Performed at [facility], [address] [place], [place] [address]   cbc     Status: Abnormal   Collection Time: 10/29/17  1:56 PM  Result Value Ref Range   WBC 7.4 3.8 - 10.6 K/uL   RBC 5.99 (H) 4.40 - 5.90 MIL/uL   Hemoglobin 18.1 (H) 13.0 - 18.0 g/dL   HCT 51.6 40.0 - 52.0 %   MCV 86.2 80.0 - 100.0 fL   MCH 30.2  26.0 - 34.0 pg   MCHC 35.0 32.0 - 36.0 g/dL   RDW 13.1 11.5 - 14.5 %   Platelets 189 150 - 440 K/uL    Comment: Performed at [facility], [address], [place], [place] [address]  Urine Drug Screen, Qualitative     Status: Abnormal   Collection Time: 10/29/17  1:56 PM  Result Value Ref Range   Tricyclic, Ur Screen NONE DETECTED NONE DETECTED   Amphetamines, Ur Screen NONE DETECTED NONE DETECTED   MDMA (Ecstasy)Ur Screen NONE DETECTED NONE DETECTED   Cocaine Metabolite,Ur [place] NONE DETECTED NONE DETECTED   Opiate, Ur Screen NONE DETECTED NONE DETECTED   Phencyclidine (PCP) Ur S NONE DETECTED NONE DETECTED   Cannabinoid 50 Ng, Ur  NONE DETECTED NONE DETECTED   Barbiturates, Ur Screen NONE DETECTED NONE DETECTED   Benzodiazepine, Ur Scrn TEST NOT PERFORMED, REAGENT NOT AVAILABLE (A) NONE DETECTED   Methadone Scn, Ur NONE DETECTED NONE DETECTED    Comment: (NOTE) Tricyclics + metabolites, urine    Cutoff 1000 ng/mL Amphetamines + metabolites, urine  Cutoff 1000 ng/mL MDMA (Ecstasy), urine              Cutoff 500 ng/mL Cocaine Metabolite, urine          Cutoff 300 ng/mL Opiate + metabolites, urine        Cutoff 300 ng/mL Phencyclidine (PCP), urine         Cutoff 25 ng/mL Cannabinoid, urine                 Cutoff 50 ng/mL Barbiturates + metabolites, urine  Cutoff 200 ng/mL Benzodiazepine, urine              Cutoff 200 ng/mL Methadone, urine                   Cutoff 300 ng/mL The urine drug screen provides only a preliminary, unconfirmed analytical test result and should not be used for non-medical purposes. Clinical consideration and professional judgment should be applied to any positive drug screen result due to possible interfering substances. A more specific alternate chemical method must be used in order to obtain a confirmed analytical result. Gas chromatography / mass spectrometry (GC/MS) is the preferred  confirmat ory method. Performed at [facility],  [address], [place], [place] [address]     Current Facility-Administered Medications  Medication Dose Route Frequency Provider Last Rate Last Dose  . ARIPiprazole (ABILIFY) tablet 5 mg  5 mg Oral Daily Nakyah Erdmann B, MD      . carbamazepine (TEGRETOL) tablet 200 mg  200 mg Oral BID AC & HS Jermie Hippe B, MD      . temazepam (RESTORIL) capsule 15 mg  15 mg Oral QHS Jovoni Borkenhagen B, MD       Current Outpatient Medications  Medication Sig Dispense Refill  . acetaminophen (TYLENOL ARTHRITIS PAIN) 650 MG CR tablet Take 1,300 mg by mouth every 8 (eight) hours as needed for pain.    . calcitRIOL (ROCALTROL) 0.5 MCG capsule TAKE TWO CAPSULES BY MOUTH DAILY 60 capsule 2  . Calcium Carbonate-Vit D-Min (CALCIUM 1200 PO) Take 4 capsules by mouth daily.     . cetirizine (ZYRTEC) 10 MG tablet Take 10 mg by mouth daily.    . citalopram (CELEXA) 40 MG tablet Take by mouth.    . Doxycycline Hyclate 50 MG TABS TAKE ONE tablet by MOUTH ONCE daily, TAKE WITH food AND lots OF IN FLUID    . lamoTRIgine (LAMICTAL) 150 MG tablet Take 150 mg by mouth daily.     . potassium chloride (K-DUR) 10 MEQ tablet TAKE ONE TABLET BY MOUTH DAILY 30 tablet 1  . traZODone (DESYREL) 50 MG tablet Take 100 mg by mouth at bedtime.    . TRUVADA 200-300 MG tablet     . Vitamin D, Ergocalciferol, (DRISDOL) 50000 units CAPS capsule Take 1 capsule (50,000 Units total) by mouth every 7 (seven) days. 12 capsule 1    Musculoskeletal: Strength & Muscle Tone: within normal limits Gait & Station: normal Patient leans: N/A  Psychiatric Specialty Exam: Physical Exam  Nursing note and vitals reviewed. Psychiatric: His speech is normal and behavior is normal. His mood appears anxious. Cognition and memory are normal. He expresses impulsivity. He exhibits a depressed mood. He expresses suicidal ideation.    Review of Systems  Neurological: Negative.   Psychiatric/Behavioral: Positive for depression and  suicidal ideas. The patient is nervous/anxious.   All other systems reviewed and are negative.   Blood pressure 108/72, pulse 90, temperature 98.7 F (37.1 C), temperature source Oral, resp. rate 16, height '5\' 7"'  (1.702 m), weight 69.4 kg (153 lb), SpO2 97 %.Body mass index is 23.96 kg/m.  General Appearance: Casual  Eye Contact:  Good  Speech:  Clear and Coherent  Volume:  Normal  Mood:  Anxious and Depressed  Affect:  Congruent  Thought Process:  Goal Directed and Descriptions of Associations: Intact  Orientation:  Full (Time, Place, and Person)  Thought Content:  WDL  Suicidal Thoughts:  Yes.  without intent/plan  Homicidal Thoughts:  No  Memory:  Immediate;   Fair Recent;   Fair Remote;   Fair  Judgement:  Impaired  Insight:  Present  Psychomotor Activity:  Normal  Concentration:  Concentration: Fair and Attention Span: Fair  Recall:  [employer] of Knowledge:  Fair  Language:  Fair  Akathisia:  No  Handed:  Right  AIMS (if indicated):     Assets:  Communication Skills Desire for Improvement Financial Resources/Insurance [place] Talents/Skills Transportation Vocational/Educational  ADL's:  Intact  Cognition:  WNL  Sleep:        Treatment Plan Summary: Daily contact with  patient to assess and evaluate symptoms and progress in treatment and Medication management   PLAN: 1. Admit to psychiatry. 2. Admission orders in place.  Disposition: Recommend psychiatric Inpatient admission when medically cleared. Supportive therapy provided about ongoing stressors. Discussed crisis plan, support from social network, calling 911, coming to the Emergency Department, and calling Suicide Hotline.  Orson Slick, MD 10/29/2017 6:38 PM

## 2017-10-29 NOTE — Progress Notes (Signed)
New admit from home voluntarily committed for worsening  depression and anxiety, has not taken his medicines for over a month and now having interpersonal conflict and stress and mood swings. UDS is clear, denies any  alcohol  And drug use, patient is alert and oriented x 4, compliant with admission assessments,VSS, unit guidelines and expected behaviors discussed , personal hygiene products provided , medications given as ordered,  unit  And room orientation complete , safety search done with Coralyn Mark RN , no contraband found and skin assessment  Is clean no issues, patient is calm and maintaining safety no distress.

## 2017-10-29 NOTE — Tx Team (Signed)
Initial Treatment Plan 10/29/2017 10:17 PM Ernst Breach SAY:[id number]    PATIENT STRESSORS: Financial difficulties Health problems Medication change or noncompliance   PATIENT STRENGTHS: Average or above average intelligence Capable of independent living General fund of knowledge Motivation for treatment/growth Physical Health   PATIENT IDENTIFIED PROBLEMS: Depression/Anxiety    Personal Conflict and stress    Noncompliant with his medications             DISCHARGE CRITERIA:  Improved stabilization in mood, thinking, and/or behavior Medical problems require only outpatient monitoring Motivation to continue treatment in a less acute level of care Need for constant or close observation no longer present Reduction of life-threatening or endangering symptoms to within safe limits  PRELIMINARY DISCHARGE PLAN: Outpatient therapy Participate in family therapy Return to previous living arrangement  PATIENT/FAMILY INVOLVEMENT: This treatment plan has been presented to and reviewed with the patient, Kevin Ramos,   The patient  have been given the opportunity to ask questions and make suggestions.  Clemens Catholic, RN 10/29/2017, 10:17 PM

## 2017-10-29 NOTE — ED Notes (Signed)
Patient received visitors one at a time from his mother, father and grandparents. Patient was appropriate and cooperative. NAD noted.

## 2017-10-29 NOTE — BH Assessment (Signed)
Patient is to be admitted to [facility] by Dr. Weber Cooks.  Attending Physician will be Dr. Bary Leriche.   Patient has been assigned to room 306-B, by [facility] Charge Nurse T'Ywan.   Intake Paper Work has been signed and placed on patient chart.  ER staff is aware of the admission:  Lattie Haw, ER Dian Situ   Dr. Kerman Passey, ER MD   Donneta Romberg, Patient's Nurse   Meredith Mody, Patient Access.

## 2017-10-29 NOTE — Consult Note (Signed)
Kevin Ramos meets criteria for admission to psychiatry when bed available. Admission orders entered. Full consult to follow.

## 2017-10-29 NOTE — BH Assessment (Signed)
Assessment Note  Kevin Ramos is an 22 y.o. male who presents to the ER due to having thoughts of ending his life.  He states he has had these thoughts in the past but they had left. Now they have return and afraid of what he may do. 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 Patient currently lives in [place] and came to [place] to visit his mother and for vacation.  Patient currently denies any involvement with the legal system and no use of mind-altering substances. He denies history of aggression and violence.  Diagnosis: Depression  Past Medical History:  Past Medical History:  Diagnosis Date  . Depression     History reviewed. No pertinent surgical history.  Family History:  Family History  Problem Relation Age of Onset  . Hypoparathyroidism Mother     Social History:  reports that he has never smoked. He has never used smokeless tobacco. He reports that he does not use drugs. His alcohol history is not on file.  Additional Social History:  Alcohol / Drug Use Pain Medications: See PTA Prescriptions: See PTA Over the Counter: See PTA History of alcohol / drug use?: No history of alcohol / drug abuse Longest period of sobriety (when/how long): Reports of none Negative Consequences of Use: (n/a) Withdrawal Symptoms: (n/a)  CIWA: CIWA-Ar BP: 108/72 Pulse Rate: 90 COWS:    Allergies: No Known Allergies  Home Medications:  (Not in a hospital admission)  OB/GYN Status:  No LMP for male patient.  General Assessment Data Location of Assessment: [facility] ED TTS Assessment: In system Is this a Tele or Face-to-Face Assessment?: Face-to-Face Is this an Initial Assessment or a Re-assessment for this encounter?: Initial Assessment Marital status: Single Maiden name: n/a Is patient pregnant?: No Living Arrangements: Non-relatives/Friends(Roommates) Can pt return to current living arrangement?: Yes Admission Status:  Voluntary Is patient capable of signing voluntary admission?: Yes Referral Source: Self/Family/Friend Insurance type: Insurance risk surveyor Exam ([place]) Medical Exam completed: Yes  Crisis Care Plan Living Arrangements: Non-relatives/Friends(Roommates) Legal Guardian: Other:(Self) Name of Psychiatrist: Reports of none Name of Therapist: Reports of none  Education Status Is patient currently in school?: No Is the patient employed, unemployed or receiving disability?: Employed  Risk to self with the past 6 months Suicidal Ideation: Yes-Currently Present Has patient been a risk to self within the past 6 months prior to admission? : No Suicidal Intent: No Has patient had any suicidal intent within the past 6 months prior to admission? : No Is patient at risk for suicide?: No Suicidal Plan?: No Has patient had any suicidal plan within the past 6 months prior to admission? : No Access to Means: No What has been your use of drugs/alcohol within the last 12 months?: Reports of none Previous Attempts/Gestures: No How many times?: 0 Other Self Harm Risks: Reports of none Triggers for Past Attempts: None known Intentional Self Injurious Behavior: None Family Suicide History: No Recent stressful life event(s): Financial Problems Persecutory voices/beliefs?: No Depression: Yes Depression Symptoms: Tearfulness, Insomnia, Fatigue, Isolating, Loss of interest in usual pleasures Substance abuse history and/or treatment for substance abuse?: No Suicide prevention information given to non-admitted patients: Not applicable  Risk to Others within the past 6 months Homicidal Ideation: No Does patient have any lifetime risk of violence toward others beyond the six months prior to admission? : No Thoughts of Harm to Others: No Current Homicidal Intent: No Current Homicidal Plan: No Access  to Homicidal Means: No Identified Victim: Reports of none History of harm to others?:  No Assessment of Violence: None Noted Violent Behavior Description: Reports of none Does patient have access to weapons?: No Criminal Charges Pending?: No Does patient have a court date: No Is patient on probation?: No  Psychosis Hallucinations: None noted Delusions: None noted  Mental Status Report Appearance/Hygiene: Unremarkable, In scrubs Eye Contact: Good Motor Activity: Freedom of movement, Unremarkable Speech: Logical/coherent Level of Consciousness: Alert Mood: Depressed, Anxious, Sad, Pleasant Affect: Appropriate to circumstance, Depressed, Sad Anxiety Level: Minimal Thought Processes: Coherent, Relevant Judgement: Unimpaired Orientation: Person, Place, Time, Situation, Appropriate for developmental age Obsessive Compulsive Thoughts/Behaviors: Minimal  Cognitive Functioning Concentration: Normal Memory: Recent Intact, Remote Intact Is patient IDD: No Is patient DD?: No Insight: Fair Impulse Control: Fair Appetite: Good Have you had any weight changes? : No Change Sleep: No Change(Have trouble falling and staying asleep) Total Hours of Sleep: 6 Vegetative Symptoms: None  ADLScreening [facility] Assessment Services) Patient's cognitive ability adequate to safely complete daily activities?: Yes Patient able to express need for assistance with ADLs?: Yes Independently performs ADLs?: Yes (appropriate for developmental age)  Prior Inpatient Therapy Prior Inpatient Therapy: No  Prior Outpatient Therapy Prior Outpatient Therapy: Yes Prior Therapy Dates: 03/2017 Prior Therapy Facilty/Provider(s): Dr. Rufina Falco office) Reason for Treatment: Bipolar II Does patient have an ACCT team?: No Does patient have Intensive In-House Services?  : No Does patient have [facility] services? : No Does patient have P4CC services?: No  ADL Screening (condition at time of admission) Patient's cognitive ability adequate to safely complete daily activities?: Yes Is the patient deaf  or have difficulty hearing?: No Does the patient have difficulty seeing, even when wearing glasses/contacts?: No Does the patient have difficulty concentrating, remembering, or making decisions?: No Patient able to express need for assistance with ADLs?: Yes Does the patient have difficulty dressing or bathing?: No Independently performs ADLs?: Yes (appropriate for developmental age) Does the patient have difficulty walking or climbing stairs?: No Weakness of Legs: None Weakness of Arms/Hands: None  Home Assistive Devices/Equipment Home Assistive Devices/Equipment: None  Therapy Consults (therapy consults require a physician order) PT Evaluation Needed: No OT Evalulation Needed: No SLP Evaluation Needed: No Abuse/Neglect Assessment (Assessment to be complete while patient is alone) Abuse/Neglect Assessment Can Be Completed: Yes Physical Abuse: Denies Verbal Abuse: Denies Sexual Abuse: Denies Exploitation of patient/patient's resources: Denies Self-Neglect: Denies Values / Beliefs Cultural Requests During Hospitalization: None Spiritual Requests During Hospitalization: None Consults Spiritual Care Consult Needed: No Social Work Consult Needed: No Regulatory affairs officer (For Healthcare) Does Patient Have a Medical Advance Directive?: No Would patient like information on creating a medical advance directive?: No - Patient declined       Child/Adolescent Assessment Running Away Risk: Denies(Patient is an adult)  Disposition:  Disposition Initial Assessment Completed for this Encounter: Yes  On Site Evaluation by:   Reviewed with Physician:    Gunnar Fusi MS, LCAS, [place], [place], CCSI Therapeutic Triage Specialist 10/29/2017 5:05 PM

## 2017-10-29 NOTE — ED Provider Notes (Signed)
[facility] Emergency Department Provider Note  ____________________________________________   First MD Initiated Contact with Patient 10/29/17 1409     (approximate)  I have reviewed the triage vital signs and the nursing notes.   HISTORY  Chief Complaint Depression   HPI Kevin Ramos is a 22 y.o. male who self presents to the emergency department requesting help with his depression.  He has a long-standing history of depression and formerly took Lamictal and Celexa.  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  He has been under increasing amounts of stress recently and while he has no clear plan he does have vague suicidal ideation.  He decided to fly home to be with family and seek psychiatric help here in [place] rather than [place] as he is more familiar with our area.  He has not seen his psychiatrist in [place] in some time and today when he called her clinic she advised him to come to the emergency department because it is been too long since she had seen him.  He did not try to hurt himself and he has never made a suicide attempt.  He denies drug or alcohol use.  His symptoms have been insidious onset slowly progressive are now moderate in severity.  They are worsened by interpersonal conflict and stress and nothing particular seems to make them better.  He has not taken his medications in over a month because he has tremendous anxiety when looking at his medications.    Past Medical History:  Diagnosis Date  . Depression     Patient Active Problem List   Diagnosis Date Noted  . 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 No pertinent surgical history.  Prior to Admission medications   Medication Sig Start Date End Date Taking? Authorizing Provider  acetaminophen (TYLENOL ARTHRITIS PAIN) 650 MG CR tablet Take 1,300 mg by mouth every 8 (eight) hours as needed  for pain.    [provider]  calcitRIOL (ROCALTROL) 0.5 MCG capsule TAKE TWO CAPSULES BY MOUTH DAILY 03/29/17   Elayne Snare, MD  Calcium Carbonate-Vit D-Min (CALCIUM 1200 PO) Take 4 capsules by mouth daily.     [provider]  cetirizine (ZYRTEC) 10 MG tablet Take 10 mg by mouth daily.    [provider]  citalopram (CELEXA) 40 MG tablet Take by mouth.    [provider]  Doxycycline Hyclate 50 MG TABS TAKE ONE tablet by MOUTH ONCE daily, TAKE WITH food AND lots OF IN FLUID 11/17/16   [provider]  lamoTRIgine (LAMICTAL) 150 MG tablet Take 150 mg by mouth daily.  12/20/16   [provider]  potassium chloride (K-DUR) 10 MEQ tablet TAKE ONE TABLET BY MOUTH DAILY 12/03/16   Elayne Snare, MD  traZODone (DESYREL) 50 MG tablet Take 100 mg by mouth at bedtime.    [provider]  TRUVADA 200-300 MG tablet  11/26/16   [provider]  Vitamin D, Ergocalciferol, (DRISDOL) 50000 units CAPS capsule Take 1 capsule (50,000 Units total) by mouth every 7 (seven) days. 09/05/16   Elayne Snare, MD    Allergies Patient has no known allergies.  Family History  Problem Relation Age of Onset  . Hypoparathyroidism Mother     Social History Social History   Tobacco Use  . Smoking status: Never Smoker  . Smokeless tobacco: Never Used  Substance Use Topics  .  Alcohol use: Not on file  . Drug use: No    Review of Systems Constitutional: No fever/chills Eyes: No visual changes. ENT: No sore throat. Cardiovascular: Denies chest pain. Respiratory: Denies shortness of breath. Gastrointestinal: No abdominal pain.  No nausea, no vomiting.  No diarrhea.  No constipation. Genitourinary: Negative for dysuria. Musculoskeletal: Negative for back pain. Skin: Negative for rash. Neurological: Negative for headaches, focal weakness or numbness.   ____________________________________________   PHYSICAL EXAM:  VITAL SIGNS: ED Triage Vitals    Enc Vitals Group     BP 10/29/17 1350 108/72     Pulse Rate 10/29/17 1350 90     Resp 10/29/17 1350 16     Temp 10/29/17 1350 98.7 F (37.1 C)     Temp Source 10/29/17 1350 Oral     SpO2 10/29/17 1350 97 %     Weight 10/29/17 1348 153 lb (69.4 kg)     Height 10/29/17 1348 5\' 7"  (1.702 m)     Head Circumference --      Peak Flow --      Pain Score 10/29/17 1348 0     Pain Loc --      Pain Edu? --      Excl. in [place]? --     Constitutional: Alert and oriented x4 fidgeting and quite anxious appearing although nontoxic Eyes: PERRL EOMI. Head: Atraumatic. Nose: No congestion/rhinnorhea. Mouth/Throat: No trismus Neck: No stridor.   Cardiovascular: Normal rate, regular rhythm. Grossly normal heart sounds.  Good peripheral circulation. Respiratory: Normal respiratory effort.  No retractions. Lungs CTAB and moving good air Gastrointestinal: Soft nontender Musculoskeletal: No lower extremity edema   Neurologic:  Normal speech and language. No gross focal neurologic deficits are appreciated. Skin:  Skin is warm, dry and intact. No rash noted. Psychiatric: Anxious appearing    ____________________________________________   DIFFERENTIAL includes but not limited to  Suicidal ideation, suicide attempt, alcohol intoxication, drug overdose, metabolic derangement ____________________________________________   LABS (all labs ordered are listed, but only abnormal results are displayed)  Labs Reviewed  COMPREHENSIVE METABOLIC PANEL - Abnormal; Notable for the following components:      Result Value   Potassium 2.8 (*)    Calcium 7.5 (*)    Alkaline Phosphatase 166 (*)    Total Bilirubin 1.3 (*)    All other components within normal limits  ACETAMINOPHEN LEVEL - Abnormal; Notable for the following components:   Acetaminophen (Tylenol), Serum <10 (*)    All other components within normal limits  CBC - Abnormal; Notable for the following components:   RBC 5.99 (*)    Hemoglobin 18.1  (*)    All other components within normal limits  ETHANOL  SALICYLATE LEVEL  URINE DRUG SCREEN, QUALITATIVE ([facility] ONLY)    Lab work reviewed by me with no acute disease noted.  He is clinically insignificant hypokalemia __________________________________________  EKG   ____________________________________________  RADIOLOGY   ____________________________________________   PROCEDURES  Procedure(s) performed: no  Procedures  Critical Care performed: no  ____________________________________________   INITIAL IMPRESSION / ASSESSMENT AND PLAN / ED COURSE  Pertinent labs & imaging results that were available during my care of the patient were reviewed by me and considered in my medical decision making (see chart for details).   As part of my medical decision making, I reviewed the following data within the [place] History obtained from family if available, nursing notes, old chart and ekg, as well as notes from prior ED visits.  The patient arrives calm and cooperative and contracts for safety.  No indication for involuntary commitment.  He has attempted to go to a psychiatrist as an outpatient and has been unsuccessful.  I offered to give him resources for [facility] and [place] however he would prefer to speak to a psychiatrist today which I think is reasonable.  No need to give him any medications at this point.  He is medically stable for psychiatric evaluation.      ____________________________________________   FINAL CLINICAL IMPRESSION(S) / ED DIAGNOSES  Final diagnoses:  Depression with suicidal ideation      NEW MEDICATIONS STARTED DURING THIS VISIT:  New Prescriptions   No medications on file     Note:  This document was prepared using Dragon voice recognition software and may include unintentional dictation errors.     Darel Hong, MD 10/29/17 1527

## 2017-10-30 DIAGNOSIS — Z9119 Patient's noncompliance with other medical treatment and regimen: Secondary | ICD-10-CM

## 2017-10-30 DIAGNOSIS — F332 Major depressive disorder, recurrent severe without psychotic features: Principal | ICD-10-CM

## 2017-10-30 DIAGNOSIS — Z91199 Patient's noncompliance with other medical treatment and regimen due to unspecified reason: Secondary | ICD-10-CM

## 2017-10-30 LAB — LIPID PANEL
CHOL/HDL RATIO: 4.6 ratio
CHOLESTEROL: 162 mg/dL (ref 0–200)
HDL: 35 mg/dL — AB (ref >40)
LDL Cholesterol: 89 mg/dL (ref 0–99)
Triglycerides: 192 mg/dL — ABNORMAL HIGH (ref <150)
VLDL: 38 mg/dL (ref 0–40)

## 2017-10-30 LAB — HEMOGLOBIN A1C
HEMOGLOBIN A1C: 4.9 % (ref 4.8–5.6)
MEAN PLASMA GLUCOSE: 93.93 mg/dL

## 2017-10-30 LAB — TSH: TSH: 4.013 u[IU]/mL (ref 0.350–4.500)

## 2017-10-30 MED ORDER — LAMOTRIGINE 25 MG PO TABS: 25.0000 mg | ORAL_TABLET | Freq: Every day | ORAL | 1 refills | Status: DC

## 2017-10-30 MED ORDER — TEMAZEPAM 7.5 MG PO CAPS: 7.5000 mg | ORAL_CAPSULE | Freq: Every evening | ORAL | 0 refills | Status: DC | PRN

## 2017-10-30 MED ORDER — TEMAZEPAM 7.5 MG PO CAPS
7.5000 mg | ORAL_CAPSULE | Freq: Every evening | ORAL | Status: DC | PRN
  Administered 2017-10-30: 7.5 mg via ORAL
  Filled 2017-10-30: qty 1

## 2017-10-30 MED ORDER — LAMOTRIGINE 25 MG PO TABS
25.0000 mg | ORAL_TABLET | Freq: Every day | ORAL | Status: DC
  Administered 2017-10-30: 25 mg via ORAL
  Filled 2017-10-30: qty 1

## 2017-10-30 MED ORDER — CITALOPRAM HYDROBROMIDE 40 MG PO TABS: 40.0000 mg | ORAL_TABLET | Freq: Every day | ORAL | 1 refills | Status: DC

## 2017-10-30 MED ORDER — CITALOPRAM HYDROBROMIDE 20 MG PO TABS
20.0000 mg | ORAL_TABLET | Freq: Every day | ORAL | Status: DC
  Administered 2017-10-30: 20 mg via ORAL
  Filled 2017-10-30: qty 1

## 2017-10-30 NOTE — BHH Suicide Risk Assessment (Addendum)
[facility] Admission Suicide Risk Assessment   Nursing information obtained from:  Patient Demographic factors:  Caucasian Current Mental Status:  NA Loss Factors:  Decline in physical health Historical Factors:  Impulsivity Risk Reduction Factors:  Positive therapeutic relationship  Total Time spent with patient: 1 hour Principal Problem: Major depressive disorder, recurrent severe without psychotic features ([place]) Diagnosis:   Patient Active Problem List   Diagnosis Date Noted  . Major depressive disorder, recurrent severe without psychotic features ([place]) [F33.2] 10/29/2017    Priority: High  . Noncompliance [Z91.19] 10/30/2017  . Suicidal ideation [R45.851] 10/29/2017  . 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  World Pharmacologist [facility]). Score between 0-7:  no or low risk or alcohol related problems. Score between 8-15:  moderate risk of alcohol related problems. Score between 16-19:  high risk of alcohol related problems. Score 20 or above:  warrants further diagnostic evaluation for alcohol dependence and treatment.   CLINICAL FACTORS:   Depression:   Insomnia   Musculoskeletal: Strength & Muscle Tone: within normal limits Gait & Station: normal Patient leans: N/A  Psychiatric Specialty Exam: Physical Exam  Nursing note and vitals reviewed. Psychiatric: His speech is normal and behavior is normal. His mood appears anxious. Cognition and memory are normal. He expresses impulsivity. He exhibits a depressed mood. He expresses suicidal ideation.    Review of Systems  Neurological: Negative.   Psychiatric/Behavioral: Positive for depression and suicidal ideas. The patient is nervous/anxious and has insomnia.   All other systems reviewed  and are negative.   Blood pressure 104/73, pulse 100, temperature 98.2 F (36.8 C), temperature source Oral, resp. rate 18, height 5\' 7"  (1.702 m), weight 69.4 kg (153 lb), SpO2 100 %.Body mass index is 23.96 kg/m.  General Appearance: Casual  Eye Contact:  Good  Speech:  Clear and Coherent  Volume:  Normal  Mood:  Anxious and Depressed  Affect:  Flat  Thought Process:  Goal Directed and Descriptions of Associations: Intact  Orientation:  Full (Time, Place, and Person)  Thought Content:  WDL  Suicidal Thoughts:  Yes.  without intent/plan  Homicidal Thoughts:  No  Memory:  Immediate;   Fair Recent;   Fair Remote;   Fair  Judgement:  Impaired  Insight:  Shallow  Psychomotor Activity:  Normal  Concentration:  Concentration: Fair and Attention Span: Fair  Recall:  [employer] of Knowledge:  Fair  Language:  Fair  Akathisia:  No  Handed:  Right  AIMS (if indicated):     Assets:  Communication Skills Desire for Improvement Financial Resources/Insurance [place] Talents/Skills Vocational/Educational  ADL's:  Intact  Cognition:  WNL  Sleep:  Number of Hours: 7.15      COGNITIVE FEATURES THAT CONTRIBUTE TO RISK:  None    SUICIDE RISK:   Moderate:  Frequent suicidal ideation with limited intensity, and duration, some specificity in terms of plans, no associated intent, good self-control, limited dysphoria/symptomatology, some risk factors present, and identifiable protective factors, including available and accessible social support.  PLAN OF CARE: hospital ademission, medication management, discharge planning.  Mr. Fritze is a 22-year-male with a history of depression admitted for worsening depression and suicidal ideation without a plan in the context of treatment noncompliance and severe social stressors.  #Suicidal ideation -patient os able to  contract for safety in the hospital  #Mood -we started Effexor, Abilify and  Tegretol for depression and mood stabilizatrion but he now remembers that Abilify "did nothing for him"  #Hyperparathyroidism -continue Ca supplements and Vit D -Ca level low, Vit D level pending  #Labs -lipid panel. TSH, A1C -EKG  #Disposition -discharge to home with family -needs follow up appointment with psychiatrist in [place]  I certify that inpatient services furnished can reasonably be expected to improve the patient's condition.   Orson Slick, MD 10/30/2017, 8:59 AM

## 2017-10-30 NOTE — Progress Notes (Signed)
Recreation Therapy Notes  Date: 10/30/2017  Time: 9:30 am   Location: Craft Room   Behavioral response: N/A   Intervention Topic: Goals  Discussion/Intervention: Patient did not attend group.   Clinical Observations/Feedback:  Patient did not attend group.   Ashley Montminy LRT/CTRS         Blayne Garlick 10/30/2017 1:31 PM

## 2017-10-30 NOTE — BHH Group Notes (Signed)
[place] Group Notes:  (Nursing/MHT/Case Management/Adjunct)  Date:  10/30/2017  Time:  9:28 PM  Type of Therapy:  Group Therapy  Participation Level:  Did Not Attend   Nehemiah Settle 10/30/2017, 9:28 PM

## 2017-10-30 NOTE — BHH Counselor (Signed)
Adult Comprehensive Assessment  Patient ID: Kevin Ramos, male   DOB: [birth year], 22 y.o.   MRN: [id number]  Information Source: Information source: Patient  Current Stressors:  Patient states their primary concerns and needs for treatment are:: (P) "I was feeling depressed and suicidal due to not taking medications." Patient states their goals for this hospitilization and ongoing recovery are:: (P) "To get a prescription." Educational / Learning stressors: (P) None Employment / Job issues: (P) Pt. reports that although he is employed, he has not been making any money and has been using his savings to manage expenses. Family Relationships: (P) Pt. reports that his mother wants him to stay in [place] / Lack of resources (include bankruptcy): (P) None Housing / Lack of housing: (P) None Physical health (include injuries & life threatening diseases): (P) None Social relationships: (P) Pt. reports wanting  a better support system in [place] Substance abuse: (P) None Bereavement / Loss: (P) None  Living/Environment/Situation:  Living Arrangements: (P) Non-relatives/Friends Living conditions (as described by patient or guardian): (P) Pt. lives in an apartment in [place] Who else lives in the home?: (P) Patient and 2 roomates How long has patient lived in current situation?: (P) Since October 2017 What is atmosphere in current home: (P) Comfortable  Family History:  Marital status: (P) Single Are you sexually active?: (P) No What is your sexual orientation?: (P) Heterosexual Has your sexual activity been affected by drugs, alcohol, medication, or emotional stress?: (P) Yes, Pt. reports being tired all the time. Does patient have children?: (P) No  Childhood History:  By whom was/is the patient raised?: (P) Both parents Description of patient's relationship with caregiver when they were a child: (P) "We had a good relationship. I was raised well." Patient's description of current relationship  with people who raised him/her: (P) "Good, they are both very supportive." How were you disciplined when you got in trouble as a child/adolescent?: (P) "Time out and spankings on occasion." Does patient have siblings?: (P) Yes Number of Siblings: (P) 1 Description of patient's current relationship with siblings: (P) Little sister "Okay, it could be better." Did patient suffer any verbal/emotional/physical/sexual abuse as a child?: (P) No Did patient suffer from severe childhood neglect?: (P) No Has patient ever been sexually abused/assaulted/raped as an adolescent or adult?: (P) No(Pt. 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 [employer] Did You Receive Any Psychiatric Treatment/Services While in the [employer]?: No Are There Guns or Other Weapons in [place]?: No  Financial Resources:   Financial resources: Income from employment Does patient have a representative payee or guardian?: No  Alcohol/Substance Abuse:   What has been your use of drugs/alcohol within the last 12 months?: None Alcohol/Substance Abuse Treatment Hx: Denies past history  Social  Support System:   Patient's Community Support System: Good Describe Community Support System: Parents  and friends Type of faith/religion: Darrick Meigs How does patient's faith help to cope with current illness?: Pt. reports "It helps me detatch from some things."  Leisure/Recreation:   Leisure and Hobbies: Investment banker, operational, singing, acting, playing the piano, hiking , reading and baking  Strengths/Needs:   What is the patient's perception of their strengths?: Reliable, dependent and a people person Patient states they can use these personal strengths during their treatment to contribute to their recovery: "I'm a people pleaser, I need to learn to pelase myself more and stop worrying about other people." Patient states these barriers may affect/interfere with their treatment: None Patient states these barriers may affect their return to the community: None Other important information patient would like considered in planning for their treatment: None  Discharge Plan:   Currently receiving community mental health services: No Patient states concerns and preferences for aftercare planning are: Pt. wants a psychiatrist in [place] Patient states they will know when they are safe and ready for discharge when: "When I get my medications." Does patient have access to transportation?: Yes Does patient have financial barriers related to discharge medications?: No Will patient be returning to same living situation after discharge?: Yes  Summary/Recommendations:   Summary and Recommendations (to be completed by the evaluator): Patient is a 22 year old Caucasian male admitted voluntarily and diagnosed with Major depressive disorder, recurrent severe without psychotic features. Patient lives in [place] and was recently visiting his family in [place] where he is originally from. Pt. reports that he has been off his medications and has a goal of getting back on them for his mental health. He reports stressors of not taking medications, lack of stable income at his job and a limited support system in [place]. He denies any substance  use/alcohol. His UDS was negative for all substances. His affect was congruent. At discharge, patient wants to return home and attend outpatient treatment. While here, patient will benefit from crisis stabilization, medication evaluation, group therapy and psychoeducation, in addition to case management for discharge planning. At discharge, it is recommended that patient remain compliant with the established discharge plan and continue treatment.   Darin Engels. 10/30/2017

## 2017-10-30 NOTE — H&P (Addendum)
Psychiatric Admission Assessment Adult  Patient Identification: Kevin Ramos MRN:  [id number] Date of Evaluation:  10/30/2017 Chief Complaint:  DEPRESSION Principal Diagnosis: Major depressive disorder, recurrent severe without psychotic features ([place]) Diagnosis:   Patient Active Problem List   Diagnosis Date Noted  . Major depressive disorder, recurrent severe without psychotic features ([place]) [F33.2] 10/29/2017    Priority: High  . Noncompliance [Z91.19] 10/30/2017  . Suicidal ideation [R45.851] 10/29/2017  . 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 Kevin Ramos is a 22 year old homosexual male with a history of depression.  Chief complaint. "I just felt sad and angry."  History of present illness. Information was obtained from the patient and the chart. The patient came to [place] to spend some time at the beach with his family but did not feel happy or relaxed. To the contrary, he had returning suicidal thoughts with no intention or plans and was frequently and uncharacteristically upset and argumentative with his parents. He is usually a pleaser and conflict avoidant. 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 He reports poor sleep, decreased appetite with some weight loss that he thought beneficial as he gained a lot of weight on medications, poor energy and concentration, social isolation, crying spells, heightened anxiety and passing suicidal thoughts. He denies ever experiencing psychotic symptoms. He has infrequent panic attacks, no social anxiety, PTSD or OCD. He does not drink or use drugs.   Except for mood swings, he could not identify other symptoms suggestive of bipolar mania. He sometimes feels goofy, talks too much. No hypereligiosity or excess energy.  Past psychiatric history. Never hospitalized. Denies suicide attempts. Treated for depression and  some mood instability while in college. He tried numerous medications last summer but apparently responded well to a combination of Celexa and Lamictal. He started taking it irregularly in the spring of 2018 and stopped altogether few months ago. Reports that the pills made him nauseated and vomiting. Could not tell me which one. He considers one of the two "a stimulant". The patient and his mother strongly push diagnosis of bipolar.  Family psychiatric history. Mother with depression.  Social histroy. 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  Total Time spent with patient: 45 minutes  Is the patient at risk to self? Yes.    Has the patient been a risk to self in the past 6 months? No.  Has the patient been a risk to self within the distant past? No.  Is the patient a risk to others? No.  Has the patient been a risk to others in the past 6 months? No.  Has the patient been a risk to others within the distant past? No.   Prior Inpatient Therapy:   Prior Outpatient Therapy:    Alcohol Screening: 1. How often do you have a drink containing alcohol?: Monthly or less 2. How many drinks containing alcohol do you have on a typical day when you are drinking?: 1 or 2 3. How often do you have six or more drinks on one occasion?: Never AUDIT-C Score: 1 4. How often during the last year have you found that you were not able to stop drinking once you had started?: Less than monthly 5. How often during the last year have you failed to do what  was normally expected from you becasue of drinking?: Never 6. How often during the last year have you needed a first drink in the morning to get yourself going after a heavy drinking session?: Never 7. How often during the last year have you had a feeling of guilt of remorse after drinking?: Never 8. How often during the last year have you been unable to remember what  happened the night before because you had been drinking?: Never 9. Have you or someone else been injured as a result of your drinking?: No 10. Has a relative or friend or a doctor or another health worker been concerned about your drinking or suggested you cut down?: No Alcohol Use Disorder Identification Test Final Score (AUDIT): 2 Intervention/Follow-up: Alcohol Education Substance Abuse History in the last 12 months:  No. Consequences of Substance Abuse: NA Previous Psychotropic Medications: Yes  Psychological Evaluations: No  Past Medical History:  Past Medical History:  Diagnosis Date  . Depression    History reviewed. No pertinent surgical history. Family History:  Family History  Problem Relation Age of Onset  . Hypoparathyroidism Mother    Tobacco Screening: Have you used any form of tobacco in the last 30 days? (Cigarettes, Smokeless Tobacco, Cigars, and/or Pipes): No Social History:  Social History   Substance and Sexual Activity  Alcohol Use Not on file     Social History   Substance and Sexual Activity  Drug Use No    Additional Social History:                           Allergies:  No Known Allergies Lab Results:  Results for orders placed or performed during the hospital encounter of 10/29/17 (from the past 48 hour(s))  Lipid panel     Status: Abnormal   Collection Time: 10/29/17  1:56 PM  Result Value Ref Range   Cholesterol 162 0 - 200 mg/dL   Triglycerides 192 (H) <150 mg/dL   HDL 35 (L) >40 mg/dL   Total CHOL/HDL Ratio 4.6 RATIO   VLDL 38 0 - 40 mg/dL   LDL Cholesterol 89 0 - 99 mg/dL    Comment:        Total Cholesterol/HDL:CHD Risk Coronary Heart Disease Risk Table                     Men   Women  1/2 Average Risk   3.4   3.3  Average Risk       5.0   4.4  2 X Average Risk   9.6   7.1  3 X Average Risk  23.4   11.0        Use the calculated Patient Ratio above and the CHD Risk Table to determine the patient's CHD Risk.         ATP III CLASSIFICATION (LDL):  <100     mg/dL   Optimal  100-129  mg/dL   Near or Above                    Optimal  130-159  mg/dL   Borderline  160-189  mg/dL   High  >190     mg/dL   Very High Performed at [facility], [address] [place], [place] [address]   TSH     Status: None   Collection Time: 10/29/17  1:56 PM  Result Value Ref Range   TSH 4.013 0.350 -  4.500 uIU/mL    Comment: Performed by a 3rd Generation assay with a functional sensitivity of <=0.01 uIU/mL. Performed at [facility], [address] [place],  [address]     Blood Alcohol level:  Lab Results  Component Value Date   [facility] <10 66/08/3014    Metabolic Disorder Labs:  No results found for: HGBA1C, MPG No results found for: PROLACTIN Lab Results  Component Value Date   CHOL 162 10/29/2017   TRIG 192 (H) 10/29/2017   HDL 35 (L) 10/29/2017   CHOLHDL 4.6 10/29/2017   VLDL 38 10/29/2017   LDLCALC 89 10/29/2017    Current Medications: Current Facility-Administered Medications  Medication Dose Route Frequency Provider Last Rate Last Dose  . acetaminophen (TYLENOL) tablet 650 mg  650 mg Oral Q6H PRN Pucilowska, Jolanta B, MD      . alum & mag hydroxide-simeth (MAALOX/MYLANTA) 200-200-20 MG/5ML suspension 30 mL  30 mL Oral Q4H PRN Pucilowska, Jolanta B, MD      . calcitRIOL (ROCALTROL) capsule 0.5 mcg  0.5 mcg Oral Daily Pucilowska, Jolanta B, MD   0.5 mcg at 10/30/17 0814  . calcium-vitamin D (OSCAL WITH D) 500-200 MG-UNIT per tablet 2 tablet  2 tablet Oral Daily Pucilowska, Jolanta B, MD   2 tablet at 10/30/17 0813  . carbamazepine (TEGRETOL) tablet 200 mg  200 mg Oral BID AC & HS Pucilowska, Jolanta B, MD   200 mg at 10/30/17 0820  . hydrOXYzine (ATARAX/VISTARIL) tablet 25 mg  25 mg Oral TID PRN Pucilowska, Jolanta B, MD      . magnesium hydroxide (MILK OF MAGNESIA) suspension 30 mL  30 mL Oral Daily PRN Pucilowska, Jolanta B, MD      . temazepam (RESTORIL) capsule 7.5 mg   7.5 mg Oral QHS PRN Pucilowska, Jolanta B, MD      . venlafaxine XR (EFFEXOR-XR) 24 hr capsule 150 mg  150 mg Oral Q breakfast Pucilowska, Jolanta B, MD   150 mg at 10/30/17 0813  . Vitamin D (Ergocalciferol) (DRISDOL) capsule 50,000 Units  50,000 Units Oral Q7 days Pucilowska, Jolanta B, MD   50,000 Units at 10/30/17 0813   PTA Medications: Medications Prior to Admission  Medication Sig Dispense Refill Last Dose  . acetaminophen (TYLENOL ARTHRITIS PAIN) 650 MG CR tablet Take 1,300 mg by mouth every 8 (eight) hours as needed for pain.   Taking  . calcitRIOL (ROCALTROL) 0.5 MCG capsule TAKE TWO CAPSULES BY MOUTH DAILY 60 capsule 2   . Calcium Carbonate-Vit D-Min (CALCIUM 1200 PO) Take 4 capsules by mouth daily.    Taking  . cetirizine (ZYRTEC) 10 MG tablet Take 10 mg by mouth daily.   Taking  . citalopram (CELEXA) 40 MG tablet Take by mouth.   Taking  . Doxycycline Hyclate 50 MG TABS TAKE ONE tablet by MOUTH ONCE daily, TAKE WITH food AND lots OF IN FLUID   Taking  . lamoTRIgine (LAMICTAL) 150 MG tablet Take 150 mg by mouth daily.    Taking  . potassium chloride (K-DUR) 10 MEQ tablet TAKE ONE TABLET BY MOUTH DAILY 30 tablet 1 Taking  . traZODone (DESYREL) 50 MG tablet Take 100 mg by mouth at bedtime.   Taking  . TRUVADA 200-300 MG tablet    Taking  . Vitamin D, Ergocalciferol, (DRISDOL) 50000 units CAPS capsule Take 1 capsule (50,000 Units total) by mouth every 7 (seven) days. 12 capsule 1 Taking    Musculoskeletal: Strength & Muscle Tone: within normal limits Gait &  Station: normal Patient leans: N/A  Psychiatric Specialty Exam: I reviewed physical exam performed in the ER and agree with the findings Physical Exam  Nursing note and vitals reviewed. Psychiatric: His speech is normal and behavior is normal. His mood appears anxious. Cognition and memory are normal. He expresses impulsivity. He exhibits a depressed mood. He expresses suicidal ideation.    Review of Systems  Neurological:  Negative.   Psychiatric/Behavioral: Positive for depression and suicidal ideas. The patient is nervous/anxious.   All other systems reviewed and are negative.   Blood pressure 104/73, pulse 100, temperature 98.2 F (36.8 C), temperature source Oral, resp. rate 18, height 5\' 7"  (1.702 m), weight 69.4 kg (153 lb), SpO2 100 %.Body mass index is 23.96 kg/m.  See SRA                                                  Sleep:  Number of Hours: 7.15    Treatment Plan Summary: Daily contact with patient to assess and evaluate symptoms and progress in treatment and Medication management   Mr. Frizell is a 22-year-male with a history of depression admitted for worsening depression and suicidal ideation without a plan in the context of treatment noncompliance and severe social stressors.  #Suicidal ideation -patient os able to contract for safety in the hospital  #Mood -we started Effexor, Abilify and Tegretol for depression and mood stabilizatrion but he now remembers that Abilify "did nothing for him"  #Hyperparathyroidism -continue Ca supplements and Vit D -Ca level low, Vit D level pending  #Labs -lipid panel. TSH, A1C -EKG  #Disposition -discharge to home with family -needs follow up appointment with psychiatrist in [place]  Observation Level/Precautions:  15 minute checks  Laboratory:  CBC Chemistry Profile UDS UA  Psychotherapy:    Medications:    Consultations:    Discharge Concerns:    Estimated LOS:  Other:     Physician Treatment Plan for Primary Diagnosis: Major depressive disorder, recurrent severe without psychotic features ([place]) Long Term Goal(s): Improvement in symptoms so as ready for discharge  Short Term Goals: Ability to identify changes in lifestyle to reduce recurrence of condition will improve, Ability to verbalize feelings will improve, Ability to disclose and discuss suicidal ideas, Ability to demonstrate self-control will improve,  Ability to identify and develop effective coping behaviors will improve, Ability to maintain clinical measurements within normal limits will improve, Compliance with prescribed medications will improve and Ability to identify triggers associated with substance abuse/mental health issues will improve  Physician Treatment Plan for Secondary Diagnosis: Principal Problem:   Major depressive disorder, recurrent severe without psychotic features ([place]) Active Problems:   Pseudohypoparathyroidism   Suicidal ideation   Noncompliance  Long Term Goal(s): NA  Short Term Goals: NA  I certify that inpatient services furnished can reasonably be expected to improve the patient's condition.    Orson Slick, MD 8/7/20199:06 AM

## 2017-10-30 NOTE — BHH Suicide Risk Assessment (Signed)
[place] INPATIENT:  Family/Significant Other Suicide Prevention Education  Suicide Prevention Education:  Education Completed; Kevin Ramos, Pts. mother [phone] has been identified by the patient as the family member/significant other with whom the patient will be residing, and identified as the person(s) who will aid the patient in the event of a mental health crisis (suicidal ideations/suicide attempt).  With written consent from the patient, the family member/significant other has been provided the following suicide prevention education, prior to the and/or following the discharge of the patient.  The suicide prevention education provided includes the following:  Suicide risk factors  Suicide prevention and interventions  National Suicide Hotline telephone number  [facility] assessment telephone number  [facility] Emergency Assistance [place] and/or Residential Mobile Crisis Unit telephone number  Request made of family/significant other to:  Remove weapons (e.g., guns, rifles, knives), all items previously/currently identified as safety concern.    Remove drugs/medications (over-the-counter, prescriptions, illicit drugs), all items previously/currently identified as a safety concern.  The family member/significant other verbalizes understanding of the suicide prevention education information provided.  The family member/significant other agrees to remove the items of safety concern listed above.  CSW spoke to the patients mother about precipitating events that led to the patients admission. She reports that when he came down from [place] she knew something was wrong. She says that he reports that he had been off of his medications. She reports that during their family vacation, his mood fluctuates a lot. She reports that the patient has a Ramos time taking his medications. 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 She says that the patient doesn't have many friends which may cause his anxiety and depression increase. She reports that they just want him to be happy. His other reports that he doesn't have any assess to any guns/weapons in [place]. She repots that they do have guns in the home but she will have them secured by the time the patient comes home. CSW answered all questions and addressed concerns.    Kevin Ramos 10/30/2017, 12:50 PM

## 2017-10-30 NOTE — Tx Team (Signed)
Interdisciplinary Treatment and Diagnostic Plan Update  10/30/2017 Time of Session: 10:30am AMORE GRATER MRN: [id number]  Principal Diagnosis: Major depressive disorder, recurrent severe without psychotic features [facility])  Secondary Diagnoses: Principal Problem:   Major depressive disorder, recurrent severe without psychotic features ([place]) Active Problems:   Pseudohypoparathyroidism   Suicidal ideation   Noncompliance   Current Medications:  Current Facility-Administered Medications  Medication Dose Route Frequency Provider Last Rate Last Dose  . acetaminophen (TYLENOL) tablet 650 mg  650 mg Oral Q6H PRN Pucilowska, Jolanta B, MD      . alum & mag hydroxide-simeth (MAALOX/MYLANTA) 200-200-20 MG/5ML suspension 30 mL  30 mL Oral Q4H PRN Pucilowska, Jolanta B, MD      . calcitRIOL (ROCALTROL) capsule 0.5 mcg  0.5 mcg Oral Daily Pucilowska, Jolanta B, MD   0.5 mcg at 10/30/17 0814  . calcium-vitamin D (OSCAL WITH D) 500-200 MG-UNIT per tablet 2 tablet  2 tablet Oral Daily Pucilowska, Jolanta B, MD   2 tablet at 10/30/17 0813  . citalopram (CELEXA) tablet 20 mg  20 mg Oral QHS Pucilowska, Jolanta B, MD      . hydrOXYzine (ATARAX/VISTARIL) tablet 25 mg  25 mg Oral TID PRN Pucilowska, Jolanta B, MD      . lamoTRIgine (LAMICTAL) tablet 25 mg  25 mg Oral QHS Pucilowska, Jolanta B, MD      . magnesium hydroxide (MILK OF MAGNESIA) suspension 30 mL  30 mL Oral Daily PRN Pucilowska, Jolanta B, MD      . temazepam (RESTORIL) capsule 7.5 mg  7.5 mg Oral QHS PRN Pucilowska, Jolanta B, MD      . Vitamin D (Ergocalciferol) (DRISDOL) capsule 50,000 Units  50,000 Units Oral Q7 days Pucilowska, Jolanta B, MD   50,000 Units at 10/30/17 0813   PTA Medications: Medications Prior to Admission  Medication Sig Dispense Refill Last Dose  . acetaminophen (TYLENOL ARTHRITIS PAIN) 650 MG CR tablet Take 1,300 mg by mouth every 8 (eight) hours as needed for pain.   Taking  . calcitRIOL (ROCALTROL) 0.5 MCG capsule  TAKE TWO CAPSULES BY MOUTH DAILY 60 capsule 2   . Calcium Carbonate-Vit D-Min (CALCIUM 1200 PO) Take 4 capsules by mouth daily.    Taking  . cetirizine (ZYRTEC) 10 MG tablet Take 10 mg by mouth daily.   Taking  . citalopram (CELEXA) 40 MG tablet Take by mouth.   Taking  . Doxycycline Hyclate 50 MG TABS TAKE ONE tablet by MOUTH ONCE daily, TAKE WITH food AND lots OF IN FLUID   Taking  . lamoTRIgine (LAMICTAL) 150 MG tablet Take 150 mg by mouth daily.    Taking  . potassium chloride (K-DUR) 10 MEQ tablet TAKE ONE TABLET BY MOUTH DAILY 30 tablet 1 Taking  . traZODone (DESYREL) 50 MG tablet Take 100 mg by mouth at bedtime.   Taking  . TRUVADA 200-300 MG tablet    Taking  . Vitamin D, Ergocalciferol, (DRISDOL) 50000 units CAPS capsule Take 1 capsule (50,000 Units total) by mouth every 7 (seven) days. 12 capsule 1 Taking    Patient Stressors: Financial difficulties Health problems Medication change or noncompliance  Patient Strengths: Average or above average intelligence Capable of independent living General fund of knowledge Motivation for treatment/growth Physical Health  Treatment Modalities: Medication Management, Group therapy, Case management,  1 to 1 session with clinician, Psychoeducation, Recreational therapy.   Physician Treatment Plan for Primary Diagnosis: Major depressive disorder, recurrent severe without psychotic features ([place]) Long Term Goal(s): Improvement in  symptoms so as ready for discharge NA   Short Term Goals: Ability to identify changes in lifestyle to reduce recurrence of condition will improve Ability to verbalize feelings will improve Ability to disclose and discuss suicidal ideas Ability to demonstrate self-control will improve Ability to identify and develop effective coping behaviors will improve Ability to maintain clinical measurements within normal limits will improve Compliance with prescribed medications will improve Ability to identify triggers  associated with substance abuse/mental health issues will improve NA  Medication Management: Evaluate patient's response, side effects, and tolerance of medication regimen.  Therapeutic Interventions: 1 to 1 sessions, Unit Group sessions and Medication administration.  Evaluation of Outcomes: Progressing   Physician Treatment Plan for Secondary Diagnosis: Principal Problem:   Major depressive disorder, recurrent severe without psychotic features ([place]) Active Problems:   Pseudohypoparathyroidism   Suicidal ideation   Noncompliance  Long Term Goal(s): Improvement in symptoms so as ready for discharge NA   Short Term Goals: Ability to identify changes in lifestyle to reduce recurrence of condition will improve Ability to verbalize feelings will improve Ability to disclose and discuss suicidal ideas Ability to demonstrate self-control will improve Ability to identify and develop effective coping behaviors will improve Ability to maintain clinical measurements within normal limits will improve Compliance with prescribed medications will improve Ability to identify triggers associated with substance abuse/mental health issues will improve NA     Medication Management: Evaluate patient's response, side effects, and tolerance of medication regimen.  Therapeutic Interventions: 1 to 1 sessions, Unit Group sessions and Medication administration.  Evaluation of Outcomes: Progressing   RN Treatment Plan for Primary Diagnosis: Major depressive disorder, recurrent severe without psychotic features ([place]) Long Term Goal(s): Knowledge of disease and therapeutic regimen to maintain health will improve  Short Term Goals: Ability to verbalize feelings will improve, Ability to identify and develop effective coping behaviors will improve and Compliance with prescribed medications will improve  Medication Management: RN will administer medications as ordered by provider, will assess and evaluate  patient's response and provide education to patient for prescribed medication. RN will report any adverse and/or side effects to prescribing provider.  Therapeutic Interventions: 1 on 1 counseling sessions, Psychoeducation, Medication administration, Evaluate responses to treatment, Monitor vital signs and CBGs as ordered, Perform/monitor CIWA, COWS, AIMS and Fall Risk screenings as ordered, Perform wound care treatments as ordered.  Evaluation of Outcomes: Progressing   LCSW Treatment Plan for Primary Diagnosis: Major depressive disorder, recurrent severe without psychotic features ([place]) Long Term Goal(s): Safe transition to appropriate next level of care at discharge, Engage patient in therapeutic group addressing interpersonal concerns.  Short Term Goals: Engage patient in aftercare planning with referrals and resources, Identify triggers associated with mental health/substance abuse issues and Increase skills for wellness and recovery  Therapeutic Interventions: Assess for all discharge needs, 1 to 1 time with Social worker, Explore available resources and support systems, Assess for adequacy in community support network, Educate family and significant other(s) on suicide prevention, Complete Psychosocial Assessment, Interpersonal group therapy.  Evaluation of Outcomes: Progressing   Progress in Treatment: Attending groups: No. Participating in groups: No. Taking medication as prescribed: Yes. Toleration medication: Yes. Family/Significant other contact made: No, will contact:  Patients parents Patient understands diagnosis: Yes. Discussing patient identified problems/goals with staff: Yes. Medical problems stabilized or resolved: Yes. Denies suicidal/homicidal ideation: Yes. Issues/concerns per patient self-inventory: No. Other:   New problem(s) identified: No, Describe:  None  New Short Term/Long Term Goal(s): "To find some medications that work."  Patient Goals:  To return  back to [place] and follow up with outpatient.  Discharge Plan or Barriers: To return back to [place] and follow up with outpatient.  Reason for Continuation of Hospitalization: Medication stabilization  Estimated Length of Stay: 1-2 days  Attendees: Patient: Kevin Ramos 10/30/2017 11:34 AM  Physician: Dr. Bary Leriche, MD 10/30/2017 11:34 AM  Nursing: Polly Cobia, RN 10/30/2017 11:34 AM  RN Care Manager: 10/30/2017 11:34 AM  Social Worker: Darin Engels, [place] 10/30/2017 11:34 AM  Recreational Therapist: Isaias Sakai. Marcello Fennel, LRT 10/30/2017 11:34 AM  Other: Alden Hipp, LCSW 10/30/2017 11:34 AM  Other: Derrek Gu, LCSW 10/30/2017 11:34 AM  Other: 10/30/2017 11:34 AM    Scribe for Treatment Team: Darin Engels, LCSW 10/30/2017 11:34 AM

## 2017-10-30 NOTE — BHH Suicide Risk Assessment (Signed)
[facility] Discharge Suicide Risk Assessment   Principal Problem: Major depressive disorder, recurrent severe without psychotic features [facility]) Discharge Diagnoses:  Patient Active Problem List   Diagnosis Date Noted  . Major depressive disorder, recurrent severe without psychotic features ([place]) [F33.2] 10/29/2017    Priority: High  . Noncompliance [Z91.19] 10/30/2017  . Suicidal ideation [R45.851] 10/29/2017  . Pseudohypoparathyroidism [E20.1] 08/10/2011    Total Time spent with patient: 20 minutes  Musculoskeletal: Strength & Muscle Tone: within normal limits Gait & Station: normal Patient leans: N/A  Psychiatric Specialty Exam: Review of Systems  Neurological: Negative.   Psychiatric/Behavioral: Negative.   All other systems reviewed and are negative.   Blood pressure 104/73, pulse 100, temperature 98.2 F (36.8 C), temperature source Oral, resp. rate 18, height 5\' 7"  (1.702 m), weight 69.4 kg (153 lb), SpO2 100 %.Body mass index is 23.96 kg/m.  General Appearance: Casual  Eye Contact::  Good  Speech:  Clear and Coherent409  Volume:  Normal  Mood:  Anxious  Affect:  Appropriate  Thought Process:  Goal Directed and Descriptions of Associations: Intact  Orientation:  Full (Time, Place, and Person)  Thought Content:  WDL  Suicidal Thoughts:  No  Homicidal Thoughts:  No  Memory:  Immediate;   Fair Recent;   Fair Remote;   Fair  Judgement:  Impaired  Insight:  Shallow  Psychomotor Activity:  Normal  Concentration:  Fair  Recall:  [place]  Language: Fair  Akathisia:  No  Handed:  Right  AIMS (if indicated):     Assets:  Communication Skills Desire for Improvement Financial Resources/Insurance Housing Physical Health Resilience Social Support Talents/Skills Vocational/Educational  Sleep:  Number of Hours: 7.15  Cognition: WNL  ADL's:  Intact   Mental Status Per Nursing Assessment::   On Admission:  NA  Demographic Factors:  Male,  Adolescent or young adult, Caucasian and Gay, lesbian, or bisexual orientation  Loss Factors: NA  Historical Factors: Impulsivity  Risk Reduction Factors:   Sense of responsibility to family, Employed, Living with another person, especially a relative and Positive social support  Continued Clinical Symptoms:  Depression:   Impulsivity  Cognitive Features That Contribute To Risk:  None    Suicide Risk:  Minimal: No identifiable suicidal ideation.  Patients presenting with no risk factors but with morbid ruminations; may be classified as minimal risk based on the severity of the depressive symptoms    Plan Of Care/Follow-up recommendations:  Activity:  as tolerated Diet:  as tolerated Other:  keep follow up appointments  Orson Slick, MD 10/30/2017, 10:07 PM

## 2017-10-30 NOTE — Plan of Care (Addendum)
Patient found in bed awake upon my arrival. Patient is visible and somewhat social this evening. Continues to report depression and anxiety. Affect congruent. Denies SI/HI/AVH. Denies pain. Reports eating and voiding adequately. Compliant with HS medications and staff direction. Q 15 minute checks maintained. Will continue to monitor throughout the shift. Patient slept 8.5 hours. No apparent distress. Will endorse care to oncoming shift.  Problem: Coping: Goal: Coping ability will improve Outcome: Progressing Goal: Will verbalize feelings Outcome: Progressing   Problem: Health Behavior/Discharge Planning: Goal: Ability to make decisions will improve Outcome: Progressing Goal: Compliance with therapeutic regimen will improve Outcome: Progressing   Problem: Self-Concept: Goal: Level of anxiety will decrease Outcome: Progressing Goal: Ability to modify response to factors that promote anxiety will improve Outcome: Progressing

## 2017-10-30 NOTE — Plan of Care (Signed)
Noted to work on his coping skills  Attending unit programing . Compliant with   medication. Able to verbalize  indication .  Working on ways to reduce anxiety  Identity resources  with follow up plans . Information given  in ways of better understanding . Able to state positive  attributes of self .   Problem: Coping: Goal: Coping ability will improve Outcome: Progressing Goal: Will verbalize feelings Outcome: Progressing   Problem: Health Behavior/Discharge Planning: Goal: Ability to make decisions will improve Outcome: Progressing Goal: Compliance with therapeutic regimen will improve Outcome: Progressing   Problem: Education: Goal: Ability to state activities that reduce stress will improve Outcome: Progressing   Problem: Self-Concept: Goal: Ability to identify factors that promote anxiety will improve Outcome: Progressing Goal: Level of anxiety will decrease Outcome: Progressing Goal: Ability to modify response to factors that promote anxiety will improve Outcome: Progressing   Problem: Activity: Goal: Will identify at least one activity in which they can participate Outcome: Progressing   Problem: Health Behavior/Discharge Planning: Goal: Identification of resources available to assist in meeting health care needs will improve Outcome: Progressing   Problem: Self-Concept: Goal: Will verbalize positive feelings about self Outcome: Progressing   Problem: Education: Goal: Knowledge of [place] [facility] Education information/materials will improve Outcome: Progressing

## 2017-10-30 NOTE — BHH Group Notes (Signed)
LCSW Group Therapy Note  10/30/2017 1:00 pm  Type of Therapy/Topic:  Group Therapy:  Emotion Regulation  Participation Level:  Did Not Attend   Description of Group:    The purpose of this group is to assist patients in learning to regulate negative emotions and experience positive emotions. Patients will be guided to discuss ways in which they have been vulnerable to their negative emotions. These vulnerabilities will be juxtaposed with experiences of positive emotions or situations, and patients will be challenged to use positive emotions to combat negative ones. Special emphasis will be placed on coping with negative emotions in conflict situations, and patients will process healthy conflict resolution skills.  Therapeutic Goals: 1. Patient will identify two positive emotions or experiences to reflect on in order to balance out negative emotions 2. Patient will label two or more emotions that they find the most difficult to experience 3. Patient will demonstrate positive conflict resolution skills through discussion and/or role plays  Summary of Patient Progress:  Jayke was invited to today's group, but chose not to attend.     Therapeutic Modalities:   Cognitive Behavioral Therapy Feelings Identification Dialectical Behavioral Therapy

## 2017-10-30 NOTE — Progress Notes (Signed)
D: Patient stated slept good last night .Stated appetite  good and energy level   normal. Stated concentration is good . Stated on Depression scale 3 , hopeless 5 and anxiety 3 .( low 0-10 high) Denies suicidal  homicidal ideations  .  No auditory hallucinations  No pain concerns . Appropriate ADL'S. Interacting with peers and staff. Noted to work on his coping skills  Attending unit programing . Compliant with   medication. Able to verbalize  indication .  Working on ways to reduce anxiety  Identity resources  with follow up plans . Information given  in ways of better understanding . Able to state positive  attributes of self . Informed  MD of patient  Vomiting this shift X3  Clear at breakfast and dinner  Food chunks at lunch.  Patient received ginger ale  For soothing stomach, voice of no other request A: Encourage patient participation with unit programming . Instruction  Given on  Medication , verbalize understanding. R: Voice no other concerns. Staff continue to monitor

## 2017-10-31 LAB — VITAMIN D 25 HYDROXY (VIT D DEFICIENCY, FRACTURES): Vit D, 25-Hydroxy: 30.7 ng/mL (ref 30.0–100.0)

## 2017-10-31 NOTE — BHH Group Notes (Signed)
LCSW Group Therapy Note 10/31/2017 9:00 AM  Type of Therapy and Topic:  Group Therapy:  Setting Goals  Participation Level:  Did Not Attend  Description of Group: In this process group, patients discussed using strengths to work toward goals and address challenges.  Patients identified two positive things about themselves and one goal they were working on.  Patients were given the opportunity to share openly and support each other's plan for self-empowerment.  The group discussed the value of gratitude and were encouraged to have a daily reflection of positive characteristics or circumstances.  Patients were encouraged to identify a plan to utilize their strengths to work on current challenges and goals.  Therapeutic Goals 1. Patient will verbalize personal strengths/positive qualities and relate how these can assist with achieving desired personal goals 2. Patients will verbalize affirmation of peers plans for personal change and goal setting 3. Patients will explore the value of gratitude and positive focus as related to successful achievement of goals 4. Patients will verbalize a plan for regular reinforcement of personal positive qualities and circumstances.  Summary of Patient Progress:  Kevin Ramos was invited to group, but chose not to attend.     Therapeutic Modalities Cognitive Behavioral Therapy Motivational Interviewing    Kevin Ramos, [name] 10/31/2017 11:48 AM

## 2017-10-31 NOTE — BHH Counselor (Signed)
CSW sat down with the patient contacted his insurance company to locate an in-network outpatient psychiatrist that he can see once he return back to [place]. [place] emailed the CSW a provider list for providers in the patients location and in-network. CSW provided this list to the patient. CSW and patient also called 4 outpatient places in [place] to inquire about outpatient enrollment. Patient was told to either call intake after a certain date or walk-in to be put on the wait-list. CSW provided this list to the patient and the list with contact numbers on the patients discharge summary.    Darin Engels, MSW, Latanya Presser, [place] Clinical Social Worker 10/31/2017 11:44 AM

## 2017-10-31 NOTE — Progress Notes (Signed)
  [facility] Adult Case Management Discharge Plan :  Will you be returning to the same living situation after discharge:  Yes,  Back to [place] At discharge, do you have transportation home?: Yes,  Parents coming at discharge Do you have the ability to pay for your medications: Yes,  Insurance  Release of information consent forms completed and in the chart;  Patient's signature needed at discharge.  Patient to Follow up at: Follow-up Information    [facility]. Go on 12/06/2017.   Why:  Please follow up with Dr. Hyman Bower for a primary care appointment on Friday December 06, 2017 at 10:20am. Please arrive at [facility] for a required financial consult before you meet with the doctor Please bring your ID, insurance card and preferred pharmacy.  Contact information: [place]: [place] [address] Phone: [phone] Fax: [phone]       [facility]. Go on 11/08/2017.   Why:  Please follow up via walk-in clinic Monday-Friday 8:30am-5pm if needed before you return back to [place]. Contact information: [place] [address] [phone]        [facility]. Call.   Why:  Please call to complete intake and schedule an appointment. Contact information: Number: [phone]       [facility]. Call.   Why:   Please call to complete intake and schedule an appointment. Contact information: Number: [phone]       [facility]. Call.   Why:   Please call to complete intake and schedule an appointment. Contact information: Phone: [phone]  *[place] [facility] [place] Go to.   Why:  Go ot the admissions department on the 2nd floor M-F between 9am-1pm to compelete intake and outpatient screening. There is a waitlist. The sooner the better. Contact information: Address: [place], [place], [address] Number: [phone]          Next level of care provider has access to  [place] and Suicide Prevention discussed: Yes,  Completed with patient and his mother  Have you used any form of tobacco in the last 30 days? (Cigarettes, Smokeless Tobacco, Cigars, and/or Pipes): No  Has patient been referred to the Quitline?: N/A patient is not a smoker  Patient has been referred for addiction treatment: N/A  Darin Engels, LCSW 10/31/2017, 11:56 AM

## 2017-10-31 NOTE — Progress Notes (Signed)
D:Patient denies SI/HI at this time. Pt appears calm and cooperative, and no distress noted. Pt. Denies pain.  A: All Personal items in locker returned to pt. Pt escorted out of the building by staff. Pt. Was given extensive discharge education.   R:  Pt States he will comply with outpatient services, and take MEDS as prescribed and follow treatment plan put in place that he was actively involved with.

## 2017-10-31 NOTE — Progress Notes (Signed)
Recreation Therapy Notes  Date: 10/31/2017  Time: 9:30 am   Location: Craft Room   Behavioral response: N/A   Intervention Topic: Time Management  Discussion/Intervention: Patient did not attend group.   Clinical Observations/Feedback:  Patient did not attend group.   Jaisean Monteforte LRT/CTRS        Vincenta Steffey 10/31/2017 10:59 AM

## 2017-10-31 NOTE — Discharge Summary (Signed)
Physician Discharge Summary Note  Patient:  Kevin Ramos is an 22 y.o., male MRN:  [id number] DOB:  05-26-1995 Patient phone:  [phone] (home)  Patient address:   [address] [place] [address],  Total Time spent with patient: 20 minutes plus 20 min on care coordination and documantation.  Date of Admission:  10/29/2017 Date of Discharge: 10/31/2017  Reason for Admission:  Suicidal ideation.  History of Present Illness:   Identifying data. Kevin Ramos is a 22 year old homosexual male with a history of depression.  Chief complaint. "I just felt sad and angry."  History of present illness. Information was obtained from the patient and the chart. The patient came to [place] to spend some time at the beach with his family but did not feel happy or relaxed. To the contrary, he had returning suicidal thoughts with no intention or plans and was frequently and uncharacteristically upset and argumentative with his parents. He is usually a pleaser and conflict avoidant. 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 He reports poor sleep, decreased appetite with some weight loss that he thought beneficial as he gained a lot of weight on medications, poor energy and concentration, social isolation, crying spells, heightened anxiety and passing suicidal thoughts. He denies ever experiencing psychotic symptoms. He has infrequent panic attacks, no social anxiety, PTSD or OCD. He does not drink or use drugs.   Except for mood swings, he could not identify other symptoms suggestive of bipolar mania. He sometimes feels goofy, talks too much. No hypereligiosity or excess energy.  Past psychiatric history. Never hospitalized. Denies suicide attempts. Treated for depression and some mood instability while in college. He tried numerous medications last summer but apparently responded well to a combination of Celexa and Lamictal.  He started taking it irregularly in the spring of 2018 and stopped altogether few months ago. Reports that the pills made him nauseated and vomiting. Could not tell me which one. He considers one of the two "a stimulant". The patient and his mother strongly push diagnosis of bipolar.  Family psychiatric history. Mother with depression.  Social histroy. 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  Principal Problem: Major depressive disorder, recurrent severe without psychotic features [facility]) Discharge Diagnoses: Patient Active Problem List   Diagnosis Date Noted  . Major depressive disorder, recurrent severe without psychotic features ([place]) [F33.2] 10/29/2017    Priority: High  . Noncompliance [Z91.19] 10/30/2017  . Suicidal ideation [R45.851] 10/29/2017  . 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 Depression    History reviewed. No pertinent surgical history. Family History:  Family History  Problem Relation Age of Onset  . Hypoparathyroidism Mother    Social History:  Social History   Substance and Sexual Activity  Alcohol Use Not on file     Social History   Substance and Sexual Activity  Drug Use No    Social History   Socioeconomic History  . Marital status: Single    Spouse name: Not on file  . Number of children: Not on file  . Years of education: Not on file  . Highest education level: Not on file  Occupational History  . Not on file  Social Needs  . Financial resource strain: Not on file  .  Food insecurity:    Worry: Not on file    Inability: Not on file  . Transportation needs:    Medical: Not on file    Non-medical: Not on file  Tobacco Use  . Smoking status: Never Smoker  . Smokeless tobacco: Never Used  Substance and Sexual Activity  . Alcohol use: Not on file  . Drug use: No  .  Sexual activity: Not on file  Lifestyle  . Physical activity:    Days per week: Not on file    Minutes per session: Not on file  . Stress: Not on file  Relationships  . Social connections:    Talks on phone: Not on file    Gets together: Not on file    Attends religious service: Not on file    Active member of club or organization: Not on file    Attends meetings of clubs or organizations: Not on file    Relationship status: Not on file  Other Topics Concern  . Not on file  Social History Narrative  . Not on file    Hospital Course:    Kevin Ramos is a 22 year old homosexual male with a history of depression admitted for worsening depression and suicidal ideation without a plan in the context of treatment noncompliance and severe social stressors.  #Suicidal ideation -patient os able to contract for safety in the hospital  #Mood -we started Effexor, Abilify and Tegretol for depression and mood stabilizatrion but he now remembers that Abilify "did nothing for him"  #Hyperparathyroidism -continue Ca supplements and Vit D -Ca level low, Vit D level pending  #Labs -lipid panel. TSH, A1C -EKG  #Disposition -discharge to home with family -needs follow up appointment with psychiatrist in [place]  Physical Findings: AIMS: Facial and Oral Movements Muscles of Facial Expression: None, normal Lips and Perioral Area: None, normal Jaw: None, normal Tongue: None, normal,Extremity Movements Upper (arms, wrists, hands, fingers): None, normal Lower (legs, knees, ankles, toes): None, normal, Trunk Movements Neck, shoulders, hips: None, normal, Overall Severity Severity of abnormal movements (highest score from questions above): None, normal Incapacitation due to abnormal movements: None, normal Patient's awareness of abnormal movements (rate only patient's report): No Awareness, Dental Status Current problems with teeth and/or dentures?: No Does patient usually wear dentures?: No   CIWA:  CIWA-Ar Total: 2 COWS:  COWS Total Score: 3  Musculoskeletal: Strength & Muscle Tone: within normal limits Gait & Station: normal Patient leans: N/A  Psychiatric Specialty Exam: Physical Exam  Nursing note and vitals reviewed. Psychiatric: He has a normal mood and affect. His speech is normal and behavior is normal. Judgment and thought content normal. Cognition and memory are normal.    Review of Systems  Neurological: Negative.   Psychiatric/Behavioral: Negative.   All other systems reviewed and are negative.   Blood pressure 118/85, pulse (!) 106, temperature 98.2 F (36.8 C), temperature source Oral, resp. rate 19, height 5\' 7"  (1.702 m), weight 69.4 kg, SpO2 98 %.Body mass index is 23.96 kg/m.  General Appearance: Casual  Eye Contact:  Good  Speech:  Clear and Coherent  Volume:  Normal  Mood:  Euthymic  Affect:  Appropriate  Thought Process:  Goal Directed and Descriptions of Associations: Intact  Orientation:  Full (Time, Place, and Person)  Thought Content:  WDL  Suicidal Thoughts:  No  Homicidal Thoughts:  No  Memory:  Immediate;   Fair Recent;   Fair Remote;   Fair  Judgement:  Impaired  Insight:  Present  Psychomotor Activity:  Normal  Concentration:  Concentration: Fair and Attention Span: Fair  Recall:  [employer] of Knowledge:  Fair  Language:  Fair  Akathisia:  No  Handed:  Right  AIMS (if indicated):     Assets:  Communication Skills Desire for Improvement Financial Resources/Insurance [place] Talents/Skills Vocational/Educational  ADL's:  Intact  Cognition:  WNL  Sleep:  Number of Hours: 8.5     Have you used any form of tobacco in the last 30 days? (Cigarettes, Smokeless Tobacco, Cigars, and/or Pipes): No  Has this patient used any form of tobacco in the last 30 days? (Cigarettes, Smokeless Tobacco, Cigars, and/or Pipes) Yes, No  Blood Alcohol level:  Lab Results  Component Value Date    ETH <10 69/62/9528    Metabolic Disorder Labs:  Lab Results  Component Value Date   HGBA1C 4.9 10/29/2017   MPG 93.93 10/29/2017   No results found for: PROLACTIN Lab Results  Component Value Date   CHOL 162 10/29/2017   TRIG 192 (H) 10/29/2017   HDL 35 (L) 10/29/2017   CHOLHDL 4.6 10/29/2017   VLDL 38 10/29/2017   LDLCALC 89 10/29/2017    See Psychiatric Specialty Exam and Suicide Risk Assessment completed by Attending Physician prior to discharge.  Discharge destination:  Home  Is patient on multiple antipsychotic therapies at discharge:  No   Has Patient had three or more failed trials of antipsychotic monotherapy by history:  No  Recommended Plan for Multiple Antipsychotic Therapies: NA  Discharge Instructions    Diet - low sodium heart healthy   Complete by:  As directed    Increase activity slowly   Complete by:  As directed      Allergies as of 10/31/2017   No Known Allergies     Medication List    STOP taking these medications   Doxycycline Hyclate 50 MG Tabs   traZODone 50 MG tablet Commonly known as:  DESYREL   TRUVADA 200-300 MG tablet Generic drug:  emtricitabine-tenofovir   TYLENOL ARTHRITIS PAIN 650 MG CR tablet Generic drug:  acetaminophen     TAKE these medications     Indication  calcitRIOL 0.5 MCG capsule Commonly known as:  ROCALTROL TAKE TWO CAPSULES BY MOUTH DAILY  Indication:  Secondary Hyperparathyroidism   CALCIUM 1200 PO Take 4 capsules by mouth daily.  Indication:  hypocalcemia   cetirizine 10 MG tablet Commonly known as:  ZYRTEC Take 10 mg by mouth daily.  Indication:  Hayfever   citalopram 40 MG tablet Commonly known as:  CELEXA Take 1 tablet (40 mg total) by mouth at bedtime. What changed:    how much to take  when to take this  Indication:  Depression   lamoTRIgine 25 MG tablet Commonly known as:  LAMICTAL Take 1 tablet (25 mg total) by mouth at bedtime. For 2 weeks, 2 tabs for 2 weeks, 4 tabs for 2  weeks, 8 tabs or 200 mg after What changed:    medication strength  how much to take  when to take this  additional instructions  Indication:  Depression   potassium chloride 10 MEQ tablet Commonly known as:  K-DUR TAKE ONE TABLET BY MOUTH DAILY  Indication:  Low Amount of Potassium in the Blood   temazepam 7.5 MG capsule Commonly known as:  RESTORIL Take 1 capsule (7.5 mg total) by mouth at bedtime as needed for sleep.  Indication:  Trouble Sleeping   Vitamin  D (Ergocalciferol) 50000 units Caps capsule Commonly known as:  DRISDOL Take 1 capsule (50,000 Units total) by mouth every 7 (seven) days.  Indication:  Decreased Function of the Parathyroid Gland        Follow-up recommendations:  Activity:  as tolerated Diet:  regular Other:  keep follow up appointments  Comments:     Signed: Orson Slick, MD 10/31/2017, 8:13 AM

## 2017-10-31 NOTE — [facility] Group Notes (Signed)
LCSW Group Therapy Note   10/31/2017  Time: 1PM  Type of Therapy/Topic:  Group Therapy:  Balance in Life  Participation Level:  Did Not Attend  Description of Group:   This group will address the concept of balance and how it feels and looks when one is unbalanced. Patients will be encouraged to process areas in their lives that are out of balance and identify reasons for remaining unbalanced. Facilitators will guide patients in utilizing problem-solving interventions to address and correct the stressor making their life unbalanced. Understanding and applying boundaries will be explored and addressed for obtaining and maintaining a balanced life. Patients will be encouraged to explore ways to assertively make their unbalanced needs known to significant others in their lives, using other group members and facilitator for support and feedback.  Therapeutic Goals: 1. Patient will identify two or more emotions or situations they have that consume much of in their lives. 2. Patient will identify signs/triggers that life has become out of balance:  3. Patient will identify two ways to set boundaries in order to achieve balance in their lives:  4. Patient will demonstrate ability to communicate their needs through discussion and/or role plays  Summary of Patient Progress: Pt was invited to attend group but chose not to attend. CSW will continue to encourage pt to attend group throughout their admission.   Therapeutic Modalities:   Cognitive Behavioral Therapy Solution-Focused Therapy Assertiveness Training  Alden Hipp, MSW, LCSW Clinical Social Worker 10/31/2017 2:00 PM

## 2018-01-14 DIAGNOSIS — E2740 Unspecified adrenocortical insufficiency: Secondary | ICD-10-CM | POA: Insufficient documentation

## 2018-05-08 DIAGNOSIS — F419 Anxiety disorder, unspecified: Secondary | ICD-10-CM | POA: Insufficient documentation

## 2018-12-24 ENCOUNTER — Other Ambulatory Visit: Payer: Self-pay | Admitting: Sports Medicine

## 2018-12-24 DIAGNOSIS — M79661 Pain in right lower leg: Secondary | ICD-10-CM

## 2019-01-13 ENCOUNTER — Ambulatory Visit: Payer: BC Managed Care – PPO

## 2019-01-27 ENCOUNTER — Encounter: Payer: Self-pay | Admitting: Podiatry

## 2019-01-27 ENCOUNTER — Ambulatory Visit (INDEPENDENT_AMBULATORY_CARE_PROVIDER_SITE_OTHER): Payer: BC Managed Care – PPO

## 2019-01-27 ENCOUNTER — Other Ambulatory Visit: Payer: Self-pay

## 2019-01-27 ENCOUNTER — Ambulatory Visit: Payer: BC Managed Care – PPO | Admitting: Podiatry

## 2019-01-27 VITALS — BP 119/75 | HR 85 | Resp 16

## 2019-01-27 DIAGNOSIS — M2141 Flat foot [pes planus] (acquired), right foot: Secondary | ICD-10-CM | POA: Diagnosis not present

## 2019-01-27 DIAGNOSIS — M722 Plantar fascial fibromatosis: Secondary | ICD-10-CM

## 2019-01-27 DIAGNOSIS — M2142 Flat foot [pes planus] (acquired), left foot: Secondary | ICD-10-CM

## 2019-01-27 MED ORDER — METHYLPREDNISOLONE 4 MG PO TBPK: ORAL_TABLET | ORAL | 0 refills | Status: DC

## 2019-01-27 NOTE — Progress Notes (Signed)
Subjective:  Patient ID: Kevin Ramos, male    DOB: 1995-12-21,  MRN: [id number]  Chief Complaint  Patient presents with  . Foot Pain    Plantar heels bilateral (R>L) - aching x 3 weeks, no injury, AM pain, initially pain was anterior ankle bilateral "one doc thought was nerve damage", fracture leg right years ago, had nerve conduction test (neg), anytime on feet he has pain, has worn orthotics in the past for flat feet  . New Patient (Initial Visit)    23 y.o. male presents with the above complaint.  Patient states that he was treated by previous podiatrist in [place].  However he is down here for the next year due to Covid.  He would like to address his foot pain at this time with me.  He states that he had orthotics made back when he was 6 however his grown out of it has not been wearing since then.  He denies doing any active stretching exercises or any other treatment modalities for bilateral heel pain.  He has been ambulating with regular sneakers.   Review of Systems: Negative except as noted in the HPI. Denies N/V/F/Ch.  Past Medical History:  Diagnosis Date  . Depression     Current Outpatient Medications:  .  acetaminophen (TYLENOL) 500 MG tablet, Take 500 mg by mouth every 6 (six) hours as needed., Disp: , Rfl:  .  ALPRAZolam (XANAX) 0.5 MG tablet, Take 0.5 mg by mouth at bedtime as needed for anxiety., Disp: , Rfl:  .  ibuprofen (ADVIL) 200 MG tablet, Take 200 mg by mouth every 6 (six) hours as needed., Disp: , Rfl:  .  calcitRIOL (ROCALTROL) 0.5 MCG capsule, TAKE TWO CAPSULES BY MOUTH DAILY, Disp: 60 capsule, Rfl: 2 .  Calcium Carbonate-Vit D-Min (CALCIUM 1200 PO), Take 4 capsules by mouth daily. , Disp: , Rfl:  .  citalopram (CELEXA) 40 MG tablet, Take 1 tablet (40 mg total) by mouth at bedtime., Disp: 30 tablet, Rfl: 1 .  CLARAVIS 30 MG capsule, , Disp: , Rfl:  .  DESCOVY 200-25 MG tablet, Take 1 tablet by mouth daily., Disp: , Rfl:  .  methylPREDNISolone (MEDROL  DOSEPAK) 4 MG TBPK tablet, Use as directed, Disp: 1 each, Rfl: 0  Social History   Tobacco Use  Smoking Status Never Smoker  Smokeless Tobacco Never Used    No Known Allergies Objective:   Vitals:   01/27/19 0947  BP: 119/75  Pulse: 85  Resp: 16   There is no height or weight on file to calculate BMI. Constitutional Well developed. Well nourished.  Vascular Dorsalis pedis pulses palpable bilaterally. Posterior tibial pulses palpable bilaterally. Capillary refill normal to all digits.  No cyanosis or clubbing noted. Pedal hair growth normal.  Neurologic Normal speech. Oriented to person, place, and time. Epicritic sensation to light touch grossly present bilaterally.  Dermatologic Nails well groomed and normal in appearance. No open wounds. No skin lesions.  Orthopedic: Normal joint ROM without pain or crepitus bilaterally. No visible deformities. Tender to palpation at the calcaneal tuber bilaterally. No pain with calcaneal squeeze bilaterally. Ankle ROM diminished range of motion bilaterally. Silfverskiold Test: positive bilaterally.   Radiographs: Taken and reviewed. No acute fractures or dislocations. No evidence of stress fracture.  Plantar heel spur absent. Posterior heel spur absent.  There is a decreasing calcaneal inclination angle increase in talar declination angle increase in Mary's angle.  All of these findings are consistent with flexible pes planus deformity  Assessment:   1. Plantar fasciitis   2. Pes planus of both feet    Plan:  Patient was evaluated and treated and all questions answered.  Plantar Fasciitis, bilaterally - XR reviewed as above.  - Educated on icing and stretching. Instructions given.  - Injection delivered to the plantar fascia as below. - DME: Plantar Fascial Brace x2 - Pharmacologic management: Meloxicam/Medrol Dose Pak. Educated on risks/benefits and proper taking of medication.  Bilateral pes planus deformity -I explained  to the patient the etiology of plantar fasciitis as a secondary issue to pes planus deformity.  He has a very flexible deformity.  I explained to the patient the etiology of pes planus and all the treatment options available including conservative and surgical. -Patient will benefit from custom-made orthotics.  I have patient seen with within 2 weeks for custom orthotics.   Procedure: Injection Tendon/Ligament Location: Bilateral plantar fascia at the glabrous junction; medial approach. Skin Prep: alcohol Injectate: 0.5 cc 0.5% marcaine plain, 0.5 cc of 1% Lidocaine, 0.5 cc kenalog 10. Disposition: Patient tolerated procedure well. Injection site dressed with a band-aid.  Return in about 4 weeks (around 02/24/2019) for Plantar fasciitis, both feet-[facility]ra F[place], Schedule with Liliane Channel, orthotics ASAP!Marland Kitchen

## 2019-01-27 NOTE — Patient Instructions (Signed)

## 2019-01-28 ENCOUNTER — Ambulatory Visit (INDEPENDENT_AMBULATORY_CARE_PROVIDER_SITE_OTHER): Payer: BC Managed Care – PPO | Admitting: Orthotics

## 2019-01-28 DIAGNOSIS — M722 Plantar fascial fibromatosis: Secondary | ICD-10-CM

## 2019-01-28 DIAGNOSIS — M2141 Flat foot [pes planus] (acquired), right foot: Secondary | ICD-10-CM

## 2019-01-28 DIAGNOSIS — M2142 Flat foot [pes planus] (acquired), left foot: Secondary | ICD-10-CM

## 2019-01-28 NOTE — Progress Notes (Signed)

## 2019-02-05 ENCOUNTER — Encounter: Payer: BC Managed Care – PPO | Admitting: Endocrinology

## 2019-02-05 ENCOUNTER — Encounter: Payer: Self-pay | Admitting: Endocrinology

## 2019-02-06 NOTE — Progress Notes (Signed)
This encounter was created in error - please disregard.

## 2019-02-09 DIAGNOSIS — D239 Other benign neoplasm of skin, unspecified: Secondary | ICD-10-CM

## 2019-02-09 HISTORY — DX: Other benign neoplasm of skin, unspecified: D23.9

## 2019-02-13 ENCOUNTER — Ambulatory Visit: Payer: BC Managed Care – PPO | Admitting: Orthotics

## 2019-02-13 ENCOUNTER — Other Ambulatory Visit: Payer: Self-pay

## 2019-02-13 DIAGNOSIS — M2142 Flat foot [pes planus] (acquired), left foot: Secondary | ICD-10-CM

## 2019-02-13 DIAGNOSIS — M2141 Flat foot [pes planus] (acquired), right foot: Secondary | ICD-10-CM

## 2019-02-13 DIAGNOSIS — M722 Plantar fascial fibromatosis: Secondary | ICD-10-CM

## 2019-02-13 NOTE — Progress Notes (Signed)
Patient came in today to pick up custom made foot orthotics.  The goals were accomplished and the patient reported no dissatisfaction with said orthotics.  Patient was advised of breakin period and how to report any issues. 

## 2019-02-24 ENCOUNTER — Ambulatory Visit (INDEPENDENT_AMBULATORY_CARE_PROVIDER_SITE_OTHER): Payer: BC Managed Care – PPO | Admitting: Podiatry

## 2019-02-24 ENCOUNTER — Encounter: Payer: Self-pay | Admitting: Podiatry

## 2019-02-24 ENCOUNTER — Other Ambulatory Visit: Payer: Self-pay

## 2019-02-24 DIAGNOSIS — M79672 Pain in left foot: Secondary | ICD-10-CM

## 2019-02-24 DIAGNOSIS — M79671 Pain in right foot: Secondary | ICD-10-CM | POA: Diagnosis not present

## 2019-02-24 DIAGNOSIS — M778 Other enthesopathies, not elsewhere classified: Secondary | ICD-10-CM

## 2019-02-24 DIAGNOSIS — M722 Plantar fascial fibromatosis: Secondary | ICD-10-CM

## 2019-02-24 NOTE — Progress Notes (Signed)
Subjective:  Patient ID: Kevin Ramos, male    DOB: Sep 27, 1995,  MRN: [id number]  Chief Complaint  Patient presents with  . Plantar Fasciitis    B/L feet are better but still some pain    23 y.o. male presents with the above complaint.  Patient presents with the following bilateral heel pain.  Patient states that the pain has decreased after the injection.  He states that there is still some pain there though and he would like another injection to help completely alleviate the pain.  Patient states that the bracing has helped considerably.  He also obtained the orthotics and he will be picking them up soon.  He also has a secondary complaint of extensor tendinitis pain on top of his ankle.  He states that the pain started for the last couple of days it comes and goes however it has stayed for a while now.  He wondering if anything could be done for this.  He denies any other acute complaints.  Review of Systems: Negative except as noted in the HPI. Denies N/V/F/Ch.  Past Medical History:  Diagnosis Date  . Depression     Current Outpatient Medications:  .  acetaminophen (TYLENOL) 500 MG tablet, Take 500 mg by mouth every 6 (six) hours as needed., Disp: , Rfl:  .  ALPRAZolam (XANAX) 0.5 MG tablet, Take 0.5 mg by mouth at bedtime as needed for anxiety., Disp: , Rfl:  .  calcitRIOL (ROCALTROL) 0.5 MCG capsule, TAKE TWO CAPSULES BY MOUTH DAILY, Disp: 60 capsule, Rfl: 2 .  Calcium Carbonate-Vit D-Min (CALCIUM 1200 PO), Take 4 capsules by mouth daily. , Disp: , Rfl:  .  citalopram (CELEXA) 40 MG tablet, Take 1 tablet (40 mg total) by mouth at bedtime., Disp: 30 tablet, Rfl: 1 .  CLARAVIS 30 MG capsule, , Disp: , Rfl:  .  DESCOVY 200-25 MG tablet, Take 1 tablet by mouth daily., Disp: , Rfl:  .  ibuprofen (ADVIL) 200 MG tablet, Take 200 mg by mouth every 6 (six) hours as needed., Disp: , Rfl:  .  methylPREDNISolone (MEDROL DOSEPAK) 4 MG TBPK tablet, Use as directed, Disp: 1 each, Rfl: 0  Social History   Tobacco Use  Smoking Status Never Smoker  Smokeless Tobacco Never Used    No Known Allergies Objective:   There were no vitals filed for this visit. There is no height or weight on file to calculate BMI. Constitutional Well developed. Well nourished.  Vascular Dorsalis pedis pulses palpable bilaterally. Posterior tibial pulses palpable bilaterally. Capillary refill normal to all digits.  No cyanosis or clubbing noted. Pedal hair growth normal.  Neurologic Normal speech. Oriented to person, place, and time. Epicritic sensation to light touch grossly present bilaterally.  Dermatologic Nails well groomed and normal in appearance. No open wounds. No skin lesions.  Orthopedic: Normal joint ROM without pain or crepitus bilaterally. No visible deformities. Tender to palpation at the calcaneal tuber bilaterally. No pain with calcaneal squeeze bilaterally. Ankle ROM diminished range of motion bilaterally. Silfverskiold Test: positive bilaterally.  Pain with resisted extension of the extensor tendons.  No pain with resisted flexion of the flexor tendons. Pain on palpation to the dorsal aspect of the ankle joint. Pain with ankle dorsiflexion and plantarflexion active and passive range of motion.  No deep intra-articular ankle joint pain noted.   Radiographs: None  Assessment:   1. Plantar fasciitis   2. Extensor tendinitis of foot   3. Bilateral foot pain    Plan:  Patient was evaluated and treated and all questions answered.  Plantar Fasciitis, bilaterally - XR reviewed as above.  - Educated on icing and stretching. Instructions given.  - Injection delivered to the plantar fascia as below. - DME: Night splints x2 - Pharmacologic management: Meloxicam/Medrol Dose Pak. Educated on risks/benefits and proper taking of medication.  Bilateral pes planus deformity -Orthotics made and patient will be picking them up soon.  I explained to the patient the  importance of gradually wearing them.  I am hoping that this will be helpful in terms of arch support and therefore decrease pain.  Extensor tendinitis of the right foot -I explained to the patient the etiology and various treatment options available for extensor tendinitis.  I explained to the patient that he may but benefit from a steroid injection superficial to the tendon and not within the tendon as there is a risk of rupture.  Patient elected to undergo an injection to help decrease inflammation. -A steroid injection was performed at right dorsal ankle joint using 1% plain Lidocaine and 10 mg of Kenalog. This was well tolerated.    Procedure: Injection Tendon/Ligament Location: Bilateral plantar fascia at the glabrous junction; medial approach. Skin Prep: alcohol Injectate: 0.5 cc 0.5% marcaine plain, 0.5 cc of 1% Lidocaine, 0.5 cc kenalog 10. Disposition: Patient tolerated procedure well. Injection site dressed with a band-aid.  No follow-ups on file.

## 2019-02-25 MED ORDER — MELOXICAM 15 MG PO TABS: 15.0000 mg | ORAL_TABLET | Freq: Every day | ORAL | 3 refills | Status: DC

## 2019-02-25 MED ORDER — METHYLPREDNISOLONE 4 MG PO TBPK: ORAL_TABLET | ORAL | 0 refills | Status: DC

## 2019-02-25 NOTE — Addendum Note (Signed)
Addended by: Graceann Congress D on: 02/25/2019 05:31 PM   Modules accepted: Orders

## 2019-03-23 ENCOUNTER — Ambulatory Visit: Payer: BC Managed Care – PPO | Admitting: Endocrinology

## 2019-03-25 ENCOUNTER — Ambulatory Visit: Payer: BC Managed Care – PPO | Admitting: Endocrinology

## 2019-03-25 ENCOUNTER — Encounter: Payer: Self-pay | Admitting: Endocrinology

## 2019-03-25 ENCOUNTER — Other Ambulatory Visit: Payer: Self-pay

## 2019-03-25 VITALS — BP 110/70 | HR 89 | Ht 67.0 in | Wt 177.6 lb

## 2019-03-25 DIAGNOSIS — E201 Pseudohypoparathyroidism: Secondary | ICD-10-CM

## 2019-03-25 DIAGNOSIS — R5383 Other fatigue: Secondary | ICD-10-CM | POA: Diagnosis not present

## 2019-03-25 LAB — COMPREHENSIVE METABOLIC PANEL
ALT: 51 U/L (ref 0–53)
AST: 30 U/L (ref 0–37)
Albumin: 4.4 g/dL (ref 3.5–5.2)
Alkaline Phosphatase: 97 U/L (ref 39–117)
BUN: 12 mg/dL (ref 6–23)
CO2: 32 mEq/L (ref 19–32)
Calcium: 10 mg/dL (ref 8.4–10.5)
Chloride: 101 mEq/L (ref 96–112)
Creatinine, Ser: 1.02 mg/dL (ref 0.40–1.50)
GFR: 88.52 mL/min (ref >60.00)
Glucose, Bld: 97 mg/dL (ref 70–99)
Potassium: 4.3 mEq/L (ref 3.5–5.1)
Sodium: 141 mEq/L (ref 135–145)
Total Bilirubin: 0.6 mg/dL (ref 0.2–1.2)
Total Protein: 7.3 g/dL (ref 6.0–8.3)

## 2019-03-25 LAB — T4, FREE: Free T4: 0.74 ng/dL (ref 0.60–1.60)

## 2019-03-25 LAB — TSH: TSH: 5.14 u[IU]/mL — ABNORMAL HIGH (ref 0.35–4.50)

## 2019-03-25 LAB — VITAMIN D 25 HYDROXY (VIT D DEFICIENCY, FRACTURES): VITD: 20.83 ng/mL — ABNORMAL LOW (ref 30.00–100.00)

## 2019-03-25 NOTE — Progress Notes (Signed)
Patient ID: Kevin Ramos, male   DOB: 22-Oct-1995, 23 y.o.   MRN: [id number]             Chief complaint: Low calcium, follow-up  History of Present Illness:  Initial history obtained in 6/18: He has had symptoms for about 15 years of his fingers and toes getting numb and hands getting tingly along with marked cramping in his fingers and toes.  Also his eyes would be twitching He was evaluated at [facility] and was found to have low calcium levels, high phosphorus levels and very high parathyroid hormone levels as high as about 1500 Lowest calcium on record is 6.8 done in 2007 Also has had a tendency to high alkaline phosphate levels, this was 376 in 2007 and most recently 151 Although the patient has improvement in symptoms with taking calcitriol and calcium supplements he does not think that these supplements help his symptoms completely and because of lack of consistent relief he does not take his medication regularly He has not taken any calcitriol for the last 1-1/2 weeks When he does take his calcium tablets he takes them both together, does feel they make him choke sometimes 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 Usually his symptoms appear to be intermittent Also he complains of feeling exhausted and getting more tired at times  His calcium was 7.1 done in 10/17 by his PCP Highest calcium prior to his coming here has been 8.3 in 2012, but he took twice the prescribed amount of calcitriol the night before  At his initial consultation he was recommended that he try to take adequate doses of calcitriol and calcium consistently to help control hypocalcemia and explained to him that because of his relatively severe condition he needs to take larger than usual doses of both calcium and calcitriol regularly Also he was found to have vitamin D deficiency and started on vitamin D supplements  50,000 units weekly  RECENT history:  He has not been seen in follow-up for over 2 years  He says he has been seen occasionally in follow-up with doctors in [place] He was told to increase his calcitriol to 3 capsules of the 0.5 daily which he takes at night He said that at least for the last 3 months he has taken his supplement very consistently Also takes 2 tablets of 1200 mg calcium at the same time  Has not had any recent labs but [facility] with [facility] his calcium was 7.5 likely from inconsistent treatment  He is having some cramping in his hands and legs at times but he thinks this is mostly when he is tired Otherwise does not complain of any tingling, twitching in his hands or face and no weakness or stiffness  He does feel significantly tired but has had other issues also  Also previously had issues with hypokalemia  He had been recommended vitamin D supplementation for baseline level of 17.8 but he has not taken this for some time    Lab Results  Component Value Date   CALCIUM 7.5 (L) 10/29/2017   CALCIUM 9.9 12/27/2016   CALCIUM 11.0 (H) 11/22/2016     He also is being supplemented with potassium because of relatively low levels, No history of hypomagnesemia   Lab Results  Component Value Date   K 2.8 (L) 10/29/2017    VITAMIN D  deficiency: He is taking 50,000 units weekly since June  Lab Results  Component Value Date   VD25OH 30.7 10/29/2017   VD25OH 41.22 12/27/2016   VD25OH 17.83 (L) 08/30/2016     Past Medical History:  Diagnosis Date  . Depression     History reviewed. No pertinent surgical history.  Family History  Problem Relation Age of Onset  . Hypoparathyroidism Mother     Social History:  reports that he has never smoked. He has never used smokeless tobacco. He reports that he does not use drugs. No history on file for alcohol.   Allergies: No Known Allergies  Allergies as of 03/25/2019   No Known  Allergies     Medication List       Accurate as of March 25, 2019  8:41 AM. If you have any questions, ask your nurse or doctor.        STOP taking these medications   methylPREDNISolone 4 MG Tbpk tablet Commonly known as: MEDROL DOSEPAK Stopped by: Elayne Snare, MD     TAKE these medications   acetaminophen 500 MG tablet Commonly known as: TYLENOL Take 500 mg by mouth every 6 (six) hours as needed.   ALPRAZolam 0.5 MG tablet Commonly known as: XANAX Take 0.5 mg by mouth at bedtime as needed for anxiety.   calcitRIOL 0.5 MCG capsule Commonly known as: ROCALTROL TAKE TWO CAPSULES BY MOUTH DAILY   CALCIUM 1200 PO Take 4 capsules by mouth daily.   citalopram 40 MG tablet Commonly known as: CELEXA Take 1 tablet (40 mg total) by mouth at bedtime.   Claravis 30 MG capsule Generic drug: ISOtretinoin   Descovy 200-25 MG tablet Generic drug: emtricitabine-tenofovir AF Take 1 tablet by mouth daily.   ibuprofen 200 MG tablet Commonly known as: ADVIL Take 200 mg by mouth every 6 (six) hours as needed.   meloxicam 15 MG tablet Commonly known as: MOBIC Take 1 tablet (15 mg total) by mouth daily.           Review of Systems  He is complaining about significant weight gain over the last 2 months or so  Wt Readings from Last 3 Encounters:  03/25/19 177 lb 9.6 oz (80.6 kg)  10/29/17 153 lb (69.4 kg)  12/31/16 165 lb 9.6 oz (75.1 kg)   His mother has hypothyroidism, he has had normal thyroid levels previously  Lab Results  Component Value Date   TSH 4.013 10/29/2017   TSH 3.29 08/30/2016   FREET4 0.84 08/30/2016     PHYSICAL EXAM:  BP 110/70 (BP Location: Left Arm, Patient Position: Sitting, Cuff Size: Normal)   Pulse 89   Ht 5\' 7"  (1.702 m)   Wt 177 lb 9.6 oz (80.6 kg)   SpO2 98%   BMI 27.82 kg/m   His face does not look puffy, no swelling of the eyes Thyroid not palpable Chvostek sign negative Deep tendon reflexes in the arms are normal No  peripheral edema  ASSESSMENT:   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  He is coming back after 2 years interval  His symptoms have been mostly controlled with taking his calcitriol and calcium regularly at least for the last 3 months He does have some nonspecific cramping at times however This is despite taking 1.5 mcg of calcitriol which is higher than his previous doses now  Currently not taking his vitamin D Also previously had hypokalemia which has not been recently evaluated Records from his previous treatment in  [place] not available currently  Also has significant fatigue which may be related to stress or depression but he also has had unusual weight gain and cold intolerance, hypothyroidism needs to be ruled out    PLAN:   Recheck labs including chemistry panel, thyroid levels including free T4, vitamin D level He will try to be consistent with his follow-up as long as he is staying here in [facility] 03/25/2019, 8:41 AM   Total visit time for evaluation and management of multiple problems, review of prior records and labs =25 minutes

## 2019-03-26 ENCOUNTER — Other Ambulatory Visit: Payer: Self-pay

## 2019-03-26 MED ORDER — LEVOTHYROXINE SODIUM 25 MCG PO TABS: 25.0000 ug | ORAL_TABLET | Freq: Every day | ORAL | 0 refills | Status: DC

## 2019-03-31 ENCOUNTER — Encounter: Payer: Self-pay | Admitting: Podiatry

## 2019-03-31 ENCOUNTER — Other Ambulatory Visit: Payer: Self-pay

## 2019-03-31 ENCOUNTER — Ambulatory Visit: Payer: BC Managed Care – PPO | Admitting: Podiatry

## 2019-03-31 DIAGNOSIS — M722 Plantar fascial fibromatosis: Secondary | ICD-10-CM

## 2019-03-31 DIAGNOSIS — M79671 Pain in right foot: Secondary | ICD-10-CM | POA: Diagnosis not present

## 2019-03-31 DIAGNOSIS — M2141 Flat foot [pes planus] (acquired), right foot: Secondary | ICD-10-CM | POA: Diagnosis not present

## 2019-03-31 DIAGNOSIS — M79672 Pain in left foot: Secondary | ICD-10-CM

## 2019-03-31 DIAGNOSIS — M778 Other enthesopathies, not elsewhere classified: Secondary | ICD-10-CM

## 2019-03-31 DIAGNOSIS — M2142 Flat foot [pes planus] (acquired), left foot: Secondary | ICD-10-CM

## 2019-03-31 NOTE — Progress Notes (Signed)
Subjective:  Patient ID: Kevin Ramos, male    DOB: 08/23/1995,  MRN: [id number]  Chief Complaint  Patient presents with  . Plantar Fasciitis    pt is here for a f/u on plantar fasciitis of the right foot, pt states that the right foot is somewhat getting better, pt states that the pain often varies, pain is now on a scale of 3 out of 92    24 y.o. male presents with the above complaint.  Patient states that bilateral plantar fasciitis pain has completely resolved.  Patient has been doing stretching exercises night splint as well as plantar fascial brace.  Patient states that this has given him good relief.  He also picked up orthotics for long-term management of pes planus deformity which has been helping.  Patient states that he likes the orthotics he has no complaint against it.  He also had a follow-up from extensor tendinitis of the right foot.  I performed a steroid injection which has completely resolve the tendinitis.  Denies any other acute complaints at this time.  Review of Systems: Negative except as noted in the HPI. Denies N/V/F/Ch.  Past Medical History:  Diagnosis Date  . Depression     Current Outpatient Medications:  .  acetaminophen (TYLENOL) 500 MG tablet, Take 500 mg by mouth every 6 (six) hours as needed., Disp: , Rfl:  .  ALPRAZolam (XANAX) 0.5 MG tablet, Take 0.5 mg by mouth at bedtime as needed for anxiety., Disp: , Rfl:  .  calcitRIOL (ROCALTROL) 0.5 MCG capsule, TAKE TWO CAPSULES BY MOUTH DAILY, Disp: 60 capsule, Rfl: 2 .  Calcium Carbonate-Vit D-Min (CALCIUM 1200 PO), Take 4 capsules by mouth daily. , Disp: , Rfl:  .  Cholecalciferol (VITAMIN D) 50 MCG (2000 UT) tablet, Take 2,000 Units by mouth daily., Disp: , Rfl:  .  citalopram (CELEXA) 40 MG tablet, Take 1 tablet (40 mg total) by mouth at bedtime., Disp: 30 tablet, Rfl: 1 .  CLARAVIS 30 MG capsule, , Disp: , Rfl:  .  DESCOVY 200-25 MG tablet, Take 1 tablet by mouth daily., Disp: , Rfl:  .  ibuprofen  (ADVIL) 200 MG tablet, Take 200 mg by mouth every 6 (six) hours as needed., Disp: , Rfl:  .  levothyroxine (SYNTHROID) 25 MCG tablet, Take 1 tablet (25 mcg total) by mouth daily before breakfast., Disp: 90 tablet, Rfl: 0 .  meloxicam (MOBIC) 15 MG tablet, Take 1 tablet (15 mg total) by mouth daily., Disp: 30 tablet, Rfl: 3  Social History   Tobacco Use  Smoking Status Never Smoker  Smokeless Tobacco Never Used    No Known Allergies Objective:   There were no vitals filed for this visit. There is no height or weight on file to calculate BMI. Constitutional Well developed. Well nourished.  Vascular Dorsalis pedis pulses palpable bilaterally. Posterior tibial pulses palpable bilaterally. Capillary refill normal to all digits.  No cyanosis or clubbing noted. Pedal hair growth normal.  Neurologic Normal speech. Oriented to person, place, and time. Epicritic sensation to light touch grossly present bilaterally.  Dermatologic Nails well groomed and normal in appearance. No open wounds. No skin lesions.  Orthopedic: Normal joint ROM without pain or crepitus bilaterally. No visible deformities. Tender to palpation at the calcaneal tuber bilaterally. No pain with calcaneal squeeze bilaterally. Ankle ROM diminished range of motion bilaterally. Silfverskiold Test: positive bilaterally.  Pain with resisted extension of the extensor tendons.  No pain with resisted flexion of the flexor tendons. Pain  on palpation to the dorsal aspect of the ankle joint. Pain with ankle dorsiflexion and plantarflexion active and passive range of motion.  No deep intra-articular ankle joint pain noted.   Radiographs: None  Assessment:   1. Plantar fasciitis, right   2. Plantar fasciitis, left   3. Bilateral foot pain   4. Pes planus of both feet   5. Extensor tendinitis of foot    Plan:  Patient was evaluated and treated and all questions answered.  Plantar Fasciitis,  bilaterally -Resolved  Bilateral pes planus deformity -Patient obtain orthotics and has been ambulating in them.  Patient has almost completed the break in period.  He denies any other acute complaints with orthotics.  Extensor tendinitis of the right foot -Resolved.    No follow-ups on file.

## 2019-04-09 ENCOUNTER — Telehealth: Payer: Self-pay

## 2019-04-09 NOTE — Telephone Encounter (Signed)
Patient called In stating that he has been taking his Thyroid medication for almost 2 weeks and thinks its been making him more tired than normal, wanting to know is that supposed to happen     Please call and advise

## 2019-04-09 NOTE — Telephone Encounter (Signed)
Thyroid medicine is supposed to make him feel less tired so it likely is from something else

## 2019-04-09 NOTE — Telephone Encounter (Signed)
Called pt and gave him MD message. Pt verbalized understanding. 

## 2019-04-27 ENCOUNTER — Other Ambulatory Visit: Payer: Self-pay

## 2019-04-30 ENCOUNTER — Ambulatory Visit: Payer: BC Managed Care – PPO | Admitting: Endocrinology

## 2019-04-30 ENCOUNTER — Encounter: Payer: Self-pay | Admitting: Endocrinology

## 2019-04-30 ENCOUNTER — Other Ambulatory Visit: Payer: Self-pay

## 2019-04-30 VITALS — BP 110/80 | HR 90 | Ht 67.0 in | Wt 184.2 lb

## 2019-04-30 DIAGNOSIS — E201 Pseudohypoparathyroidism: Secondary | ICD-10-CM | POA: Diagnosis not present

## 2019-04-30 DIAGNOSIS — R5383 Other fatigue: Secondary | ICD-10-CM | POA: Diagnosis not present

## 2019-04-30 DIAGNOSIS — R635 Abnormal weight gain: Secondary | ICD-10-CM | POA: Diagnosis not present

## 2019-04-30 LAB — BASIC METABOLIC PANEL
BUN: 13 mg/dL (ref 6–23)
CO2: 31 mEq/L (ref 19–32)
Calcium: 9.9 mg/dL (ref 8.4–10.5)
Chloride: 101 mEq/L (ref 96–112)
Creatinine, Ser: 1.05 mg/dL (ref 0.40–1.50)
GFR: 85.54 mL/min (ref >60.00)
Glucose, Bld: 91 mg/dL (ref 70–99)
Potassium: 3.8 mEq/L (ref 3.5–5.1)
Sodium: 139 mEq/L (ref 135–145)

## 2019-04-30 LAB — TSH: TSH: 4.87 u[IU]/mL — ABNORMAL HIGH (ref 0.35–4.50)

## 2019-04-30 LAB — T4, FREE: Free T4: 0.63 ng/dL (ref 0.60–1.60)

## 2019-04-30 NOTE — Progress Notes (Signed)
Patient ID: Kevin Ramos, male   DOB: 15-Oct-1995, 24 y.o.   MRN: [id number]             Chief complaint: Fatigue  History of Present Illness:  HYPOCALCEMIA:  Initial history obtained in 6/18: He has had symptoms for about 15 years of his fingers and toes getting numb and hands getting tingly along with marked cramping in his fingers and toes.  Also his eyes would be twitching He was evaluated at [facility] and was found to have low calcium levels, high phosphorus levels and very high parathyroid hormone levels as high as about 1500 Lowest calcium on record is 6.8 done in 2007 Also has had a tendency to high alkaline phosphate levels, this was 376 in 2007 and most recently 151 Although the patient has improvement in symptoms with taking calcitriol and calcium supplements he does not think that these supplements help his symptoms completely and because of lack of consistent relief he does not take his medication regularly He has not taken any calcitriol for the last 1-1/2 weeks When he does take his calcium tablets he takes them both together, does feel they make him choke sometimes 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 Usually his symptoms appear to be intermittent Also he complains of feeling exhausted and getting more tired at times  His calcium was 7.1 done in 10/17 by his PCP Highest calcium prior to his coming here has been 8.3 in 2012, but he took twice the prescribed amount of calcitriol the night before  At his initial consultation he was recommended that he try to take adequate doses of calcitriol and calcium consistently to help control hypocalcemia and explained to him that because of his relatively severe condition he needs to take larger than usual doses of both calcium and calcitriol regularly Also he was found to have vitamin D deficiency and started on vitamin D supplements  50,000 units weekly  RECENT history:  He was told in [place] to increase his calcitriol to 3 capsules of the 0.5 daily prior to his follow-up consultation in 12/20   He takes his his calcitriol and also takes 2 tablets of 1200 mg calcium at the same time at night In 12/20 his calcium was high normal at 10.0 and he was told to reduce his calcitriol to 2 capsules of the 0.5 mcg daily  He has had only one episode of cramping in his hand late at night No tingling or twitching and no weakness or stiffness   Also previously had issues with hypokalemia which had resolved  He says he has been taking his calcium and calcitriol regularly at night  He had been recommended vitamin D supplementation also on his last visit for low levels    Lab Results  Component Value Date   CALCIUM 10.0 03/25/2019   CALCIUM 7.5 (L) 10/29/2017   CALCIUM 9.9 12/27/2016    He also is being supplemented with potassium because of relatively low levels, No history of hypomagnesemia  Lab Results  Component Value Date   K 4.3 03/25/2019    VITAMIN D deficiency: He is taking vitamin D3, 2000 units daily  Lab Results  Component Value Date   VD25OH 20.83 (L) 03/25/2019   VD25OH 30.7 10/29/2017   VD25OH 41.22 12/27/2016   HYPOTHYROIDISM:  He had thyroid functions checked because of fatigue in 12/20 With his TSH  is mildly increased he was told to start taking levothyroxine 25 mcg daily However despite starting this he said that he is feeling more fatigued, has no energy and feels lethargic and sleepy during the day Also has difficulties with sleep at night No significant cold intolerance Does not think he has any symptoms of depression and continues to take his Celexa long-term  Lab Results  Component Value Date   TSH 4.87 (H) 04/30/2019   TSH 5.14 (H) 03/25/2019   TSH 4.013 10/29/2017   FREET4 0.63 04/30/2019   FREET4 0.74 03/25/2019   FREET4 0.84 08/30/2016     Past Medical History:  Diagnosis  Date  . Depression     History reviewed. No pertinent surgical history.  Family History  Problem Relation Age of Onset  . Hypoparathyroidism Mother     Social History:  reports that he has never smoked. He has never used smokeless tobacco. He reports that he does not use drugs. No history on file for alcohol.   Allergies: No Known Allergies  Allergies as of 04/30/2019   No Known Allergies     Medication List       Accurate as of April 30, 2019 11:10 AM. If you have any questions, ask your nurse or doctor.        acetaminophen 500 MG tablet Commonly known as: TYLENOL Take 500 mg by mouth every 6 (six) hours as needed.   ALPRAZolam 0.5 MG tablet Commonly known as: XANAX Take 0.5 mg by mouth at bedtime as needed for anxiety.   calcitRIOL 0.5 MCG capsule Commonly known as: ROCALTROL TAKE TWO CAPSULES BY MOUTH DAILY   CALCIUM 1200 PO Take 4 capsules by mouth daily.   citalopram 40 MG tablet Commonly known as: CELEXA Take 1 tablet (40 mg total) by mouth at bedtime.   Claravis 30 MG capsule Generic drug: ISOtretinoin   Descovy 200-25 MG tablet Generic drug: emtricitabine-tenofovir AF Take 1 tablet by mouth daily.   ibuprofen 200 MG tablet Commonly known as: ADVIL Take 200 mg by mouth every 6 (six) hours as needed.   levothyroxine 25 MCG tablet Commonly known as: SYNTHROID Take 1 tablet (25 mcg total) by mouth daily before breakfast.   meloxicam 15 MG tablet Commonly known as: MOBIC Take 1 tablet (15 mg total) by mouth daily.   Vitamin D 50 MCG (2000 UT) tablet Take 2,000 Units by mouth daily.           Review of Systems  He is complaining about significant weight gain which appears to be progressive and started probably in November He had 2 courses of Medrol Dosepak in November and December from podiatrist for tendinitis He says that he has not changed his diet recently Also because of fatigue he has not done any exercise  Wt Readings from Last  3 Encounters:  04/30/19 184 lb 3.2 oz (83.6 kg)  03/25/19 177 lb 9.6 oz (80.6 kg)  10/29/17 153 lb (69.4 kg)     PHYSICAL EXAM:  BP 110/80 (BP Location: Left Arm, Patient Position: Sitting, Cuff Size: Normal)   Pulse 90   Ht 5\' 7"  (1.702 m)   Wt 184 lb 3.2 oz (83.6 kg)   SpO2 98%   BMI 28.85 kg/m   Mild generalized obesity present No facial plethora or swelling No supraclavicular fat pads or buffalo hump Skin not thin and not dry  Thyroid not palpable Chvostek sign negative Deep tendon reflexes in the arms are normal to brisk  No  peripheral edema  ASSESSMENT:   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  His symptoms have been controlled with taking his calcitriol and calcium regularly Although he had one episode of cramping not clear if this is related to hypocalcemia since he is already taking his calcitriol and calcium very regularly His dose was reduced by 0.5 mcg calcitriol because of high normal calcium of 10  FATIGUE and weight gain: Etiology of this is unclear He does not feel depressed No cushingoid features He did have likely some weight gain with 2 courses of Medrol Dosepak in November and December Currently with 25 mcg of levothyroxine he has not had any improvement in his symptoms and he feels that he is worse    PLAN:   Recheck labs including basic metabolic panel, thyroid levels including free T4 and also free testosterone Not clear if he may have mild hypopituitarism Discussed that he needs to take his calcitriol in divided doses with 1 capsule twice daily instead of both at night  Further treatment will depend on lab results  Elayne Snare 04/30/2019, 11:10 AM

## 2019-05-03 LAB — TESTOSTERONE, FREE, TOTAL, SHBG
Sex Hormone Binding: 15.4 nmol/L — ABNORMAL LOW (ref 16.5–55.9)
Testosterone, Free: 7.5 pg/mL — ABNORMAL LOW (ref 9.3–26.5)
Testosterone: 284 ng/dL (ref 264–916)

## 2019-05-04 ENCOUNTER — Other Ambulatory Visit: Payer: Self-pay

## 2019-05-04 ENCOUNTER — Other Ambulatory Visit: Payer: Self-pay | Admitting: Endocrinology

## 2019-05-04 DIAGNOSIS — R7989 Other specified abnormal findings of blood chemistry: Secondary | ICD-10-CM

## 2019-05-04 DIAGNOSIS — R5383 Other fatigue: Secondary | ICD-10-CM

## 2019-05-04 MED ORDER — LEVOTHYROXINE SODIUM 75 MCG PO TABS: 75.0000 ug | ORAL_TABLET | Freq: Every day | ORAL | 0 refills | Status: DC

## 2019-05-06 ENCOUNTER — Other Ambulatory Visit: Payer: Self-pay

## 2019-05-07 ENCOUNTER — Telehealth: Payer: Self-pay | Admitting: Endocrinology

## 2019-05-07 NOTE — Telephone Encounter (Signed)
Patient requests to be called at ph# [phone] re: Patient received his lab results on MyChart but did not receive a call from the Nurse. Patient would like to discuss lab results and plan of action based on his lab results.

## 2019-05-07 NOTE — Telephone Encounter (Signed)
All the labs are not back but repeat testosterone was normal

## 2019-05-08 ENCOUNTER — Other Ambulatory Visit: Payer: Self-pay

## 2019-05-08 NOTE — Telephone Encounter (Signed)
Called pt and gave him MD message below and he verbalized understanding. Also notified pt that once all bloodwork comes back, and the MD reviews it, he will receive a phone call.

## 2019-05-11 LAB — SPECIMEN STATUS REPORT

## 2019-05-12 LAB — SPECIMEN STATUS REPORT

## 2019-05-12 LAB — PROLACTIN: Prolactin: 17.9 ng/mL — ABNORMAL HIGH (ref 4.0–15.2)

## 2019-05-12 LAB — LUTEINIZING HORMONE: LH: 4.1 m[IU]/mL (ref 1.7–8.6)

## 2019-05-12 NOTE — Telephone Encounter (Signed)
[facility] is repeating some labs and will let him know as soon as they call me

## 2019-05-12 NOTE — Telephone Encounter (Signed)
Patient called to follow up on results from recent labs.  Patient advised that I will pass message on the the MD/Nurse. Patient advised will receive a phone call in return once labs are reviewed.  Call back number is [phone]

## 2019-05-13 NOTE — Telephone Encounter (Signed)
Send MyChart message informing pt of this.

## 2019-05-15 ENCOUNTER — Other Ambulatory Visit: Payer: Self-pay | Admitting: Endocrinology

## 2019-05-15 DIAGNOSIS — E221 Hyperprolactinemia: Secondary | ICD-10-CM

## 2019-05-15 DIAGNOSIS — R7989 Other specified abnormal findings of blood chemistry: Secondary | ICD-10-CM

## 2019-05-15 LAB — TESTOSTERONE, FREE, TOTAL, SHBG
Sex Hormone Binding: 15.5 nmol/L — ABNORMAL LOW (ref 16.5–55.9)
Testosterone, Free: 6.7 pg/mL — ABNORMAL LOW (ref 9.3–26.5)
Testosterone: 253 ng/dL — ABNORMAL LOW (ref 264–916)

## 2019-05-18 ENCOUNTER — Ambulatory Visit: Payer: BC Managed Care – PPO | Admitting: Endocrinology

## 2019-05-18 ENCOUNTER — Telehealth: Payer: Self-pay | Admitting: Endocrinology

## 2019-05-18 NOTE — Telephone Encounter (Signed)
Patient called asking for the code for the brain scan that was ordered. He wants to know if it was with or without contrast. Ph# [phone]

## 2019-05-19 NOTE — Telephone Encounter (Signed)
Called patient a detailed VM making him aware that the brain scan ordered is w or w/o contrast and I requested he call back if he has any further questions

## 2019-05-20 ENCOUNTER — Encounter: Payer: Self-pay | Admitting: Specialist

## 2019-05-21 ENCOUNTER — Other Ambulatory Visit: Payer: Self-pay | Admitting: Endocrinology

## 2019-05-21 ENCOUNTER — Telehealth: Payer: Self-pay

## 2019-05-21 DIAGNOSIS — E038 Other specified hypothyroidism: Secondary | ICD-10-CM

## 2019-05-21 DIAGNOSIS — E201 Pseudohypoparathyroidism: Secondary | ICD-10-CM

## 2019-05-21 DIAGNOSIS — R7989 Other specified abnormal findings of blood chemistry: Secondary | ICD-10-CM

## 2019-05-21 MED ORDER — ANDRODERM 4 MG/24HR TD PT24: 1.0000 | MEDICATED_PATCH | Freq: Every day | TRANSDERMAL | 2 refills | Status: DC

## 2019-05-21 NOTE — Telephone Encounter (Signed)
PA initiated via [email or web address] for Androderm patches.  [place] Key: [id number] help? Call us at [phone] Status Sent to [facility] Androderm 4MG /24HR 24 hr patches Form [employer] of [place] Prescription Drug Authorization Form

## 2019-05-21 NOTE — Telephone Encounter (Signed)
  Kevin Ramos Key: [id number] help? Call us at [phone] Status Sent to [place] (Key: [id number])  Your information has been submitted to [employer]. [employer] is reviewing the PA request and you will receive an electronic response. You may check for the updated outcome later by reopening this request. The standard fax determination will also be sent to you directly.  If you have any questions about your PA submission, contact [employer] at [phone].

## 2019-05-25 DIAGNOSIS — Z0279 Encounter for issue of other medical certificate: Secondary | ICD-10-CM

## 2019-05-28 ENCOUNTER — Telehealth: Payer: Self-pay

## 2019-05-28 NOTE — Telephone Encounter (Signed)
Attempt made on 05/25/19 to fax PA documents for pt's testosterone medication. This attempt did not go through successfully. Another attempt made on 05/26/19. This did not go through either. Will attempt a third time and if this does not go through, company will be contacted for alternative fax number.

## 2019-05-28 NOTE — Telephone Encounter (Signed)
FAXED [place]: [employer]  Document: Prior Authorization for testosterone Other records requested: none at this time.  All above requested information has been faxed successfully to [employer] listed above. Documents and fax confirmation have been placed in the faxed file for future reference.

## 2019-06-03 NOTE — Telephone Encounter (Signed)
On 06/01/2019, [employer] requested additional blood work proving that pt did have two early morning low testosterone lab values. This was faxed to them.  On 06/03/2019, an approval letter was received via fax. Approval case number is [id number], and approval is good from 05/29/2019 through 05/28/2020. Pt has been notified.

## 2019-06-10 ENCOUNTER — Other Ambulatory Visit: Payer: BC Managed Care – PPO

## 2019-06-16 ENCOUNTER — Telehealth (HOSPITAL_COMMUNITY): Payer: Self-pay | Admitting: Psychiatry

## 2019-06-16 DIAGNOSIS — E559 Vitamin D deficiency, unspecified: Secondary | ICD-10-CM | POA: Insufficient documentation

## 2019-06-16 DIAGNOSIS — E039 Hypothyroidism, unspecified: Secondary | ICD-10-CM | POA: Insufficient documentation

## 2019-06-16 NOTE — Telephone Encounter (Signed)
D:  Tamara (referral coordinator) referred pt to [place].  A:  Placed call to orient pt; but there was no answer.  Left vm requesting pt to contact case manager.  Informed Jonelle Sidle.

## 2019-06-18 ENCOUNTER — Ambulatory Visit: Payer: BC Managed Care – PPO | Admitting: Dermatology

## 2019-06-18 ENCOUNTER — Other Ambulatory Visit: Payer: Self-pay

## 2019-06-18 VITALS — Wt 186.0 lb

## 2019-06-18 DIAGNOSIS — L700 Acne vulgaris: Secondary | ICD-10-CM

## 2019-06-18 DIAGNOSIS — K130 Diseases of lips: Secondary | ICD-10-CM | POA: Diagnosis not present

## 2019-06-18 DIAGNOSIS — Z79899 Other long term (current) drug therapy: Secondary | ICD-10-CM

## 2019-06-18 DIAGNOSIS — L853 Xerosis cutis: Secondary | ICD-10-CM

## 2019-06-18 MED ORDER — ISOTRETINOIN 30 MG PO CAPS: 60.0000 mg | ORAL_CAPSULE | Freq: Every day | ORAL | 0 refills | Status: DC

## 2019-06-18 NOTE — Progress Notes (Signed)
   Follow-Up Visit   Subjective  Kevin Ramos is a 24 y.o. male who presents for the following: Acne (face, neck, chest, Isotretinoin wk 24 f/u, Isotretinoin 30mg  1 po bid with, has had some new breakouts).   The following portions of the chart were reviewed this encounter and updated as appropriate:     Review of Systems: No other skin or systemic complaints.  Objective  Well appearing patient in no apparent distress; mood and affect are within normal limits.  A focused examination was performed including face, chest, neck. Relevant physical exam findings are noted in the Assessment and Plan.  Objective  face, neck, chest: Active areas neck, back, scarring shoulders, chest, face mostly clear  Assessment & Plan  Acne vulgaris face, neck, chest With scarring.  Improving on treatment with side effects of xerosis and cheilitis.  Wk 24 Isotretinoin Cont Isotretinoin 30mg  2 po qd with fatty meal, (labs checked 04/14/2019 while on current dose) Plan at least 2 more months of txt  Isotretinoin counseling - Do not share pills, do not donate blood. Isotretinoin (except for Absorica brand) must be taken with a fatty meal for best absorption.    IPLEDGe # [id number]  Pt confirmed in [facility] program   ISOtretinoin (ACCUTANE) 30 MG capsule - face, neck, chest   Return in about 1 month (around 07/19/2019) for Isotretinoin.   I, Othelia Pulling, RMA, am acting as scribe for Sarina Ser, MD .

## 2019-06-24 ENCOUNTER — Ambulatory Visit: Payer: BC Managed Care – PPO | Admitting: Endocrinology

## 2019-06-29 ENCOUNTER — Other Ambulatory Visit: Payer: Self-pay

## 2019-06-30 ENCOUNTER — Encounter: Payer: Self-pay | Admitting: Endocrinology

## 2019-06-30 ENCOUNTER — Ambulatory Visit (INDEPENDENT_AMBULATORY_CARE_PROVIDER_SITE_OTHER): Payer: BC Managed Care – PPO | Admitting: Endocrinology

## 2019-06-30 DIAGNOSIS — E201 Pseudohypoparathyroidism: Secondary | ICD-10-CM | POA: Diagnosis not present

## 2019-06-30 DIAGNOSIS — E038 Other specified hypothyroidism: Secondary | ICD-10-CM

## 2019-06-30 DIAGNOSIS — R7989 Other specified abnormal findings of blood chemistry: Secondary | ICD-10-CM

## 2019-06-30 LAB — BASIC METABOLIC PANEL
BUN: 7 mg/dL (ref 6–23)
CO2: 31 mEq/L (ref 19–32)
Calcium: 10.6 mg/dL — ABNORMAL HIGH (ref 8.4–10.5)
Chloride: 100 mEq/L (ref 96–112)
Creatinine, Ser: 1.04 mg/dL (ref 0.40–1.50)
GFR: 86.38 mL/min (ref >60.00)
Glucose, Bld: 94 mg/dL (ref 70–99)
Potassium: 3.7 mEq/L (ref 3.5–5.1)
Sodium: 139 mEq/L (ref 135–145)

## 2019-06-30 LAB — TSH: TSH: 3.55 u[IU]/mL (ref 0.35–4.50)

## 2019-06-30 LAB — T4, FREE: Free T4: 0.87 ng/dL (ref 0.60–1.60)

## 2019-06-30 NOTE — Progress Notes (Addendum)
Patient ID: Kevin Ramos, male   DOB: 05/27/1995, 24 y.o.   MRN: [id number]             Chief complaint: Fatigue  History of Present Illness:  HYPOCALCEMIA:  Initial history obtained in 6/18: He has had symptoms for about 15 years of his fingers and toes getting numb and hands getting tingly along with marked cramping in his fingers and toes.  Also his eyes would be twitching He was evaluated at [facility] and was found to have low calcium levels, high phosphorus levels and very high parathyroid hormone levels as high as about 1500 Lowest calcium on record is 6.8 done in 2007 Also has had a tendency to high alkaline phosphate levels, this was 376 in 2007 and most recently 151 Although the patient has improvement in symptoms with taking calcitriol and calcium supplements he does not think that these supplements help his symptoms completely and because of lack of consistent relief he does not take his medication regularly He has not taken any calcitriol for the last 1-1/2 weeks When he does take his calcium tablets he takes them both together, does feel they make him choke sometimes 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 Usually his symptoms appear to be intermittent Also he complains of feeling exhausted and getting more tired at times  His calcium was 7.1 done in 10/17 by his PCP Highest calcium prior to his coming here has been 8.3 in 2012, but he took twice the prescribed amount of calcitriol the night before  At his initial consultation he was recommended that he try to take adequate doses of calcitriol and calcium consistently to help control hypocalcemia and explained to him that because of his relatively severe condition he needs to take larger than usual doses of both calcium and calcitriol regularly Also he was found to have vitamin D deficiency and started on vitamin D supplements  50,000 units weekly  RECENT history:  He takes his 2 capsules of calcitriol and also takes 2 tablets of 1200 mg calcium at the same time at night In 12/20 his calcium was high normal at 10.0 and he was told to reduce his calcitriol to 2 capsules of the 0.5 mcg daily  No tingling in the hands or face area and no weakness.  However occasionally may feel a little stiff or slightly cramping   Also previously had issues with hypokalemia which had resolved  Has not missed any doses of his supplements   No history of hypomagnesemia  He had been recommended vitamin D supplementation also which he is taking   Lab Results  Component Value Date   CALCIUM 9.9 04/30/2019   CALCIUM 10.0 03/25/2019   CALCIUM 7.5 (L) 10/29/2017    VITAMIN D deficiency: He is taking vitamin D3, 2000 units daily  Lab Results  Component Value Date   VD25OH 20.83 (L) 03/25/2019   VD25OH 30.7 10/29/2017   VD25OH 41.22 12/27/2016   HYPOTHYROIDISM:  He had thyroid functions checked because of fatigue in 12/20 Free T4 was low normal at 0.63 as of 2/21 With his TSH is mildly increased he was told to start taking levothyroxine 25 mcg daily  On his last visit in 2/21 he was having much more fatigue and sleepiness Also has difficulties with sleep at night No cold intolerance, feels hot at night  With his TSH being consistently mildly  increased he is now taking 75 mcg daily He was told by pharmacist to take this 1 to 2 hours before breakfast and he finds this difficult to do but he is taking his medicine every morning  Lab Results  Component Value Date   TSH 4.87 (H) 04/30/2019   TSH 5.14 (H) 03/25/2019   TSH 4.013 10/29/2017   FREET4 0.63 04/30/2019   FREET4 0.74 03/25/2019   FREET4 0.84 08/30/2016   HYPOGONADISM:  Because of his significant fatigue, lack of energy and motivation testosterone was checked and free testosterone was low on 2 occasions LH normal along with prolactin Although he is trying to  take Androderm for the last 3 weeks he has not felt any change in his energy level but also is still having significant problems with body aches and insomnia  Lab Results  Component Value Date   TESTOSTERONE 253 (L) 05/05/2019   TESTOSTERONE 284 04/30/2019      Past Medical History:  Diagnosis Date  . Depression     History reviewed. No pertinent surgical history.  Family History  Problem Relation Age of Onset  . Hypoparathyroidism Mother     Social History:  reports that he has never smoked. He has never used smokeless tobacco. He reports that he does not use drugs. No history on file for alcohol.   Allergies: No Known Allergies  Allergies as of 06/30/2019   No Known Allergies     Medication List       Accurate as of June 30, 2019  8:58 AM. If you have any questions, ask your nurse or doctor.        acetaminophen 500 MG tablet Commonly known as: TYLENOL Take 500 mg by mouth every 6 (six) hours as needed.   ALPRAZolam 0.5 MG tablet Commonly known as: XANAX Take 0.5 mg by mouth at bedtime as needed for anxiety.   Androderm 4 MG/24HR Pt24 patch Generic drug: testosterone Place 1 patch onto the skin daily.   Androderm 4 MG/24HR Pt24 patch Generic drug: testosterone   bifidobacterium infantis capsule Take 1 capsule by mouth daily.   calcitRIOL 0.5 MCG capsule Commonly known as: ROCALTROL TAKE TWO CAPSULES BY MOUTH DAILY   CALCIUM 1200 PO Take 2 capsules by mouth daily.   citalopram 40 MG tablet Commonly known as: CELEXA Take 1 tablet (40 mg total) by mouth at bedtime.   Descovy 200-25 MG tablet Generic drug: emtricitabine-tenofovir AF Take 1 tablet by mouth daily.   ibuprofen 200 MG tablet Commonly known as: ADVIL Take 200 mg by mouth every 6 (six) hours as needed.   ISOtretinoin 30 MG capsule Commonly known as: ACCUTANE Take 2 capsules (60 mg total) by mouth daily. Take with fatty meal What changed: Another medication with the same name was  removed. Continue taking this medication, and follow the directions you see here. Changed by: Elayne Snare, MD   levothyroxine 75 MCG tablet Commonly known as: SYNTHROID Take 1 tablet (75 mcg total) by mouth daily before breakfast.   meloxicam 15 MG tablet Commonly known as: MOBIC Take 1 tablet (15 mg total) by mouth daily.   Vitamin D 50 MCG (2000 UT) tablet Take 2,000 Units by mouth daily.           Review of Systems  Weight gain: He is still gaining weight, previously may have gained weight from steroid doses  He thinks his diet is good and he is eating relatively less Because of fatigue he has not done any  exercise  Wt Readings from Last 3 Encounters:  06/30/19 190 lb (86.2 kg)  06/18/19 186 lb (84.4 kg)  04/30/19 184 lb 3.2 oz (83.6 kg)   He says for at least a month or so he has a generalized soreness and pain and has difficulty moving or doing any activities, waiting to see rheumatologist this week  PHYSICAL EXAM:  BP 122/74 (BP Location: Right Arm, Patient Position: Sitting, Cuff Size: Normal)   Pulse 89   Ht 5\' 7"  (1.702 m)   Wt 190 lb (86.2 kg)   SpO2 98%   BMI 29.76 kg/m   He has generalized obesity present Face looks slightly rounded and full but no plethora No supraclavicular fat pads or buffalo hump Skin not thin   Thyroid not palpable   Deep tendon reflexes in the arms are normal   ASSESSMENT:   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  His symptoms have been controlled with taking his calcitriol and calcium regularly Has only minimal symptoms recently Currently taking calcitriol both capsules together at night for better compliance  HYPOTHYROIDISM: Not clear if he has primary or secondary hypothyroidism, has had mild and persistent increase in TSH along with fatigue which is likely multifactorial Now with 75 mcg levothyroxine he is still feeling about the same  FATIGUE and weight gain: Etiology of  this is unclear Does not look cushingoid Recently also having significant generalized achiness likely fibromyalgia and is less active   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

## 2019-06-30 NOTE — Addendum Note (Signed)
Addended by: Kaylyn Lim I on: 06/30/2019 09:30 AM   Modules accepted: Orders

## 2019-06-30 NOTE — Patient Instructions (Signed)
Take 1 Calcitriol in am and 1 at nite  Thyroid pill 15-20 min before Bfst

## 2019-07-01 LAB — PROLACTIN: Prolactin: 14.3 ng/mL (ref 4.0–15.2)

## 2019-07-02 DIAGNOSIS — R0609 Other forms of dyspnea: Secondary | ICD-10-CM | POA: Insufficient documentation

## 2019-07-04 LAB — TESTOSTERONE, TOTAL, LC/MS/MS: Testosterone, total: 577.3 ng/dL (ref 264.0–916.0)

## 2019-07-08 ENCOUNTER — Telehealth: Payer: Self-pay

## 2019-07-08 NOTE — Telephone Encounter (Signed)
Patient called wanting to get lab results.  I didn't see any notes from Dr Dwyane Dee on the labs.  Patient has not refilled his meds in cause of change.  Please call patient back at [phone].

## 2019-07-08 NOTE — Telephone Encounter (Signed)
Have sent patient my chart message as follows: All your labs are good so you need to continue the same doses of testosterone and thyroid.  Since the calcium is high we will reduce your calcitriol to 1 capsule instead of 2

## 2019-07-09 NOTE — Telephone Encounter (Signed)
Noted  

## 2019-07-20 ENCOUNTER — Encounter: Payer: Self-pay | Admitting: Dermatology

## 2019-07-20 ENCOUNTER — Other Ambulatory Visit: Payer: Self-pay

## 2019-07-20 ENCOUNTER — Ambulatory Visit: Payer: BC Managed Care – PPO | Admitting: Dermatology

## 2019-07-20 DIAGNOSIS — Z79899 Other long term (current) drug therapy: Secondary | ICD-10-CM

## 2019-07-20 DIAGNOSIS — K130 Diseases of lips: Secondary | ICD-10-CM

## 2019-07-20 DIAGNOSIS — L905 Scar conditions and fibrosis of skin: Secondary | ICD-10-CM | POA: Diagnosis not present

## 2019-07-20 DIAGNOSIS — L853 Xerosis cutis: Secondary | ICD-10-CM

## 2019-07-20 DIAGNOSIS — L700 Acne vulgaris: Secondary | ICD-10-CM | POA: Diagnosis not present

## 2019-07-20 MED ORDER — ISOTRETINOIN 30 MG PO CAPS: 60.0000 mg | ORAL_CAPSULE | Freq: Every day | ORAL | 0 refills | Status: DC

## 2019-07-20 NOTE — Progress Notes (Signed)
   Isotretinoin Follow-Up Visit   Subjective  Kevin Ramos is a 24 y.o. male who presents for the following: Acne (week #28 Isotretinoin 60mg ).    Week # 28    Side effects: Dry skin, dry lips  Denies changes in night vision, shortness of breath, abdominal pain, nausea, vomiting, diarrhea, blood in stool or urine, visual changes, headaches, epistaxis, joint pain, myalgias, mood changes, depression, or suicidal ideation.   The following portions of the chart were reviewed this encounter and updated as appropriate: medications, allergies, medical history  Review of Systems:  No other skin or systemic complaints except as noted in HPI or Assessment and Plan.  Objective  Well appearing patient in no apparent distress; mood and affect are within normal limits.  An examination of the face, neck, chest, and back was performed and relevant findings are noted below.   Objective  face, neck: Scarring and pink macules. No active areas today.   Assessment & Plan   Acne vulgaris face, neck SEVERE WITH SCARRING  Discussed treatment for scarring. Advised patient we would not treat for at least a year after finishing isotretinoin and scarring can continue improve even after stopping medication.  Reordered Medications ISOtretinoin (ACCUTANE) 30 MG capsule   While taking Isotretinoin, do note share pills, and for 30 days after you finish the medication, do not donate blood. Isotretinoin is best absorbed when taken with a fatty meal. Isotretinoin can make you sensitive to the sun. Daily careful sun protection including sunscreen SPF 30+ when outdoors is recommended.  Xerosis - Continue emollients as directed  Cheilitis - Continue lip balm as directed, Dr. Luvenia Heller Cortibalm recommended  Follow-up in 30 days.  I, Ashok Cordia, CMA, am acting as scribe for Sarina Ser, MD .    Documentation: I have reviewed the above documentation for accuracy and completeness, and I agree  with the above.  Sarina Ser, MD

## 2019-07-22 ENCOUNTER — Encounter: Payer: Self-pay | Admitting: Psychiatry

## 2019-07-22 ENCOUNTER — Other Ambulatory Visit: Payer: Self-pay

## 2019-07-22 ENCOUNTER — Telehealth (INDEPENDENT_AMBULATORY_CARE_PROVIDER_SITE_OTHER): Payer: BC Managed Care – PPO | Admitting: Psychiatry

## 2019-07-22 DIAGNOSIS — F3342 Major depressive disorder, recurrent, in full remission: Secondary | ICD-10-CM | POA: Insufficient documentation

## 2019-07-22 MED ORDER — CITALOPRAM HYDROBROMIDE 40 MG PO TABS: 40.0000 mg | ORAL_TABLET | Freq: Every day | ORAL | 0 refills | Status: DC

## 2019-07-22 MED ORDER — DOXEPIN HCL 10 MG PO CAPS: ORAL_CAPSULE | ORAL | 1 refills | Status: DC

## 2019-07-22 NOTE — Progress Notes (Signed)
Psychiatric Initial Adult Assessment   I connected with  Kevin Ramos on 07/22/19 by a video enabled telemedicine application and verified that I am speaking with the correct person using two identifiers.   I discussed the limitations of evaluation and management by telemedicine. The patient expressed understanding and agreed to proceed.    Patient Identification: Kevin Ramos MRN:  [id number] Date of Evaluation:  07/22/2019   Referral Source: Self  Chief Complaint:   " I have a pretty good regimen from my previous psychiatrist."  Visit Diagnosis:    ICD-10-CM   1. MDD (major depressive disorder), recurrent, in full remission ([place])  F33.42     History of Present Illness: This is a 24 year old male with history of depression and anxiety now seen for psychiatric evaluation after being referred by self.  His medical history is significant for pseudohypoparathyroidism with childhood onset and history of severe hypocalcemia, hypothyroidism, mild hypopituitarism.  He is on Androderm patch and also takes calcitriol and levothyroxine. 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  He stated that he really wants to become an actor however after the pandemic started in March 2020 he had to return back home.  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  He informed that he had seen a psychiatrist a few years ago and was started on Celexa and was doing well.  He started seeing a provider at [facility] in [place] when he was living there.  He was last seen by a provider via telemedicine in May 2020 and since then has had enough refills so therefore did not have to see anyone.  He stated he is about to run out of Celexa and that is why he reached out and made an appointment.  He informed that he really believes Celexa has been very helpful after he did not do well on several different  medications. He reported that his mood is stable and he is able to focus.  He informed that he did have some performance anxiety for which she did take as needed propanolol followed by as needed Xanax.  He does not take any of those on a regular basis anymore.  He has leftover Xanax from more than 2 years ago.  He stated that he feels he is on the right track with Celexa in terms of his mood stabilization. He did however report difficulty in falling asleep.  He has taken trazodone for the same in the past but it caused him to have excessive grogginess on the next day.  He was agreeable to trial of doxepin. He denied any symptoms history of hypomania or mania.  He denied any psychotic symptoms.  He denied any symptoms history of PTSD.   He is seeing therapist Ms. Lady Deutscher in [place] on a regular basis. He stated currently he is a bit stressed about not being able to pursue his passion affecting and also the pandemic impacting so many other parts of his life however given the general circumstances he thinks he is doing pretty well.  Past Psychiatric History: MDD, Performance anxiety  Previous Psychotropic Medications: Yes .  Zoloft-did not help, Abilify-did not help, Lamictal-made him feel sick.  Trazodone-made him excessively groggy next day  Substance Abuse History in the last 12 months:  No.  Consequences of Substance Abuse: NA  Past Medical History:  Past Medical  History:  Diagnosis Date  . Acne   . Depression   . Dysplastic nevus 02/09/2019   right upper back   No past surgical history on file.  Family Psychiatric History: denied  Family History:  Family History  Problem Relation Age of Onset  . Hypoparathyroidism Mother     Social History:   Social History   Socioeconomic History  . Marital status: Single    Spouse name: Not on file  . Number of children: Not on file  . Years of education: Not on file  . Highest education level: Not on file  Occupational History   . Not on file  Tobacco Use  . Smoking status: Never Smoker  . Smokeless tobacco: Never Used  Substance and Sexual Activity  . Alcohol use: Not on file  . Drug use: No  . Sexual activity: Not on file  Other Topics Concern  . Not on file  Social History Narrative  . Not on file   Social Determinants of Health   Financial Resource Strain:   . Difficulty of Paying Living Expenses:   Food Insecurity:   . Worried About Charity fundraiser in the Last Year:   . Arboriculturist in the Last Year:   Transportation Needs:   . Film/video editor (Medical):   Marland Kitchen Lack of Transportation (Non-Medical):   Physical Activity:   . Days of Exercise per Week:   . Minutes of Exercise per Session:   Stress:   . Feeling of Stress :   Social Connections:   . Frequency of Communication with Friends and Family:   . Frequency of Social Gatherings with Friends and Family:   . Attends Religious Services:   . Active Member of Clubs or Organizations:   . Attends Archivist Meetings:   Marland Kitchen Marital Status:     Additional Social History: Currently in grad school, doing MBA.  Is not working at present.  Allergies:  No Known Allergies  Metabolic Disorder Labs: Lab Results  Component Value Date   HGBA1C 4.9 10/29/2017   MPG 93.93 10/29/2017   Lab Results  Component Value Date   PROLACTIN 14.3 06/30/2019   PROLACTIN 17.9 (H) 05/05/2019   Lab Results  Component Value Date   CHOL 162 10/29/2017   TRIG 192 (H) 10/29/2017   HDL 35 (L) 10/29/2017   CHOLHDL 4.6 10/29/2017   VLDL 38 10/29/2017   LDLCALC 89 10/29/2017   Lab Results  Component Value Date   TSH 3.55 06/30/2019    Therapeutic Level Labs: No results found for: LITHIUM No results found for: CBMZ No results found for: VALPROATE  Current Medications: Current Outpatient Medications  Medication Sig Dispense Refill  . acetaminophen (TYLENOL) 500 MG tablet Take 500 mg by mouth every 6 (six) hours as needed.    .  ALPRAZolam (XANAX) 0.5 MG tablet Take 0.5 mg by mouth at bedtime as needed for anxiety.    . bifidobacterium infantis (ALIGN) capsule Take 1 capsule by mouth daily.    . calcitRIOL (ROCALTROL) 0.5 MCG capsule TAKE TWO CAPSULES BY MOUTH DAILY 60 capsule 2  . Calcium Carbonate-Vit D-Min (CALCIUM 1200 PO) Take 2 capsules by mouth daily.     . Cholecalciferol (VITAMIN D) 50 MCG (2000 UT) tablet Take 2,000 Units by mouth daily.    . citalopram (CELEXA) 40 MG tablet Take 1 tablet (40 mg total) by mouth at bedtime. 30 tablet 1  . DESCOVY 200-25 MG tablet Take 1 tablet  by mouth daily.    Marland Kitchen ibuprofen (ADVIL) 200 MG tablet Take 200 mg by mouth every 6 (six) hours as needed.    . ISOtretinoin (ACCUTANE) 30 MG capsule Take 2 capsules (60 mg total) by mouth daily. Take with fatty meal 60 capsule 0  . levothyroxine (SYNTHROID) 75 MCG tablet Take 1 tablet (75 mcg total) by mouth daily before breakfast. 90 tablet 0  . meloxicam (MOBIC) 15 MG tablet Take 1 tablet (15 mg total) by mouth daily. 30 tablet 3  . testosterone (ANDRODERM) 4 MG/24HR PT24 patch Place 1 patch onto the skin daily. 30 patch 2  . testosterone (ANDRODERM) 4 MG/24HR PT24 patch      No current facility-administered medications for this visit.      Psychiatric Specialty Exam: Review of Systems  There were no vitals taken for this visit.There is no height or weight on file to calculate BMI.  General Appearance: Well Groomed  Eye Contact:  Good  Speech:  Clear and Coherent and Normal Rate  Volume:  Normal  Mood:  Euthymic  Affect:  Congruent  Thought Process:  Goal Directed, Linear and Descriptions of Associations: Intact  Orientation:  Full (Time, Place, and Person)  Thought Content: Logical   Suicidal Thoughts:  No  Homicidal Thoughts:  No  Memory:  Recent;   Good Remote;   Good  Judgement:  Good  Insight:  Good  Psychomotor Activity:  Normal  Concentration:  Concentration: Good and Attention Span: Good  Recall:  Good  Fund of  Knowledge: Good  Language: Good  Akathisia:  Negative  Handed:  Right  AIMS (if indicated): not done  Assets:  Communication Skills Desire for Improvement Financial Resources/Insurance Housing  ADL's:  Intact  Cognition: WNL  Sleep:  Difficulty with falling asleep     Screenings: AIMS     Admission (Discharged) from 10/29/2017 in [facility] Total Score  0    AUDIT     Admission (Discharged) from 10/29/2017 in [facility]  Alcohol Use Disorder Identification Test Final Score (AUDIT)  2      Assessment and Plan: Based on patient's evaluation and past history, patient appears to be doing relatively well on Celexa 40 mg daily.  He did complain of difficulty in falling asleep and was agreeable to try doxepin.  He is seeing a therapist regularly.  1. MDD (major depressive disorder), recurrent, in full remission ([place])  -Continue citalopram (CELEXA) 40 MG tablet; Take 1 tablet (40 mg total) by mouth daily.  Dispense: 90 tablet; Refill: 0 -Start doxepin (SINEQUAN) 10 MG capsule; Take 1 to 2 capsules at bedtime as needed for sleep  Dispense: 60 capsule; Refill: 1  Follow-up in 4 weeks. Continue individual therapy.  [place] Crane, MD 4/28/20219:47 AM

## 2019-07-23 DIAGNOSIS — M25551 Pain in right hip: Secondary | ICD-10-CM | POA: Insufficient documentation

## 2019-07-23 DIAGNOSIS — M461 Sacroiliitis, not elsewhere classified: Secondary | ICD-10-CM | POA: Insufficient documentation

## 2019-07-29 ENCOUNTER — Other Ambulatory Visit: Payer: Self-pay | Admitting: Internal Medicine

## 2019-07-29 DIAGNOSIS — M25551 Pain in right hip: Secondary | ICD-10-CM

## 2019-07-29 DIAGNOSIS — M461 Sacroiliitis, not elsewhere classified: Secondary | ICD-10-CM

## 2019-07-29 DIAGNOSIS — M25552 Pain in left hip: Secondary | ICD-10-CM

## 2019-08-15 ENCOUNTER — Ambulatory Visit
Admission: RE | Admit: 2019-08-15 | Discharge: 2019-08-15 | Disposition: A | Discharge status: Home / Self Care | Payer: BC Managed Care – PPO | Source: Ambulatory Visit | Attending: Internal Medicine | Admitting: Internal Medicine

## 2019-08-15 DIAGNOSIS — M25552 Pain in left hip: Secondary | ICD-10-CM

## 2019-08-15 DIAGNOSIS — M461 Sacroiliitis, not elsewhere classified: Secondary | ICD-10-CM

## 2019-08-15 DIAGNOSIS — M25551 Pain in right hip: Secondary | ICD-10-CM

## 2019-08-17 ENCOUNTER — Other Ambulatory Visit: Payer: Self-pay

## 2019-08-17 ENCOUNTER — Encounter: Payer: Self-pay | Admitting: Psychiatry

## 2019-08-17 ENCOUNTER — Telehealth (INDEPENDENT_AMBULATORY_CARE_PROVIDER_SITE_OTHER): Payer: BC Managed Care – PPO | Admitting: Psychiatry

## 2019-08-17 DIAGNOSIS — F3342 Major depressive disorder, recurrent, in full remission: Secondary | ICD-10-CM | POA: Diagnosis not present

## 2019-08-17 MED ORDER — CITALOPRAM HYDROBROMIDE 40 MG PO TABS: 40.0000 mg | ORAL_TABLET | Freq: Every day | ORAL | 1 refills | Status: DC

## 2019-08-17 MED ORDER — DOXEPIN HCL 10 MG PO CAPS: ORAL_CAPSULE | ORAL | 1 refills | Status: DC

## 2019-08-17 NOTE — Progress Notes (Signed)
[facility] MD/PA/NP OP Progress Note  Virtual Visit via Video Note  I connected with Kevin Ramos on 08/17/19 at  1:30 PM EDT by a video enabled telemedicine application and verified that I am speaking with the correct person using two identifiers.  Location: Patient: Home Provider: Clinic   I discussed the limitations of evaluation and management by telemedicine and the availability of in person appointments. The patient expressed understanding and agreed to proceed.  I provided 14 minutes of non-face-to-face time during this encounter.    08/17/2019 1:36 PM Kevin Ramos  MRN:  [id number]  Chief Complaint: " I am doing well."  HPI: Pt reported doing well. 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 He is working on getting his own insurance starting next month. His mood has been stable. He is sleeping much better with help of Doxepin. He usually takes 1 capsule, has taken 2 capsules on some occassions. He requested 90 day supply of medications. He denied any significant concerns at this time.  Visit Diagnosis:    ICD-10-CM   1. MDD (major depressive disorder), recurrent, in full remission ([facility])  F33.42 doxepin (SINEQUAN) 10 MG capsule    citalopram (CELEXA) 40 MG tablet    Past Psychiatric History: MDD  Past Medical History:  Past Medical History:  Diagnosis Date  . Acne   . Depression   . Dysplastic nevus 02/09/2019   right upper back   No past surgical history on file.  Family Psychiatric History: denied  Family History:  Family History  Problem Relation Age of Onset  . Hypoparathyroidism Mother     Social History:  Social History   Socioeconomic History  . Marital status: Single    Spouse name: Not on file  . Number of children: Not on file  . Years of education: Not on file  . Highest education level: Not on file  Occupational History  . Not on file  Tobacco Use  . Smoking status: Never Smoker  . Smokeless tobacco: Never Used  Substance  and Sexual Activity  . Alcohol use: Not on file  . Drug use: No  . Sexual activity: Not on file  Other Topics Concern  . Not on file  Social History Narrative  . Not on file   Social Determinants of Health   Financial Resource Strain:   . Difficulty of Paying Living Expenses:   Food Insecurity:   . Worried About Charity fundraiser in the Last Year:   . Arboriculturist in the Last Year:   Transportation Needs:   . Film/video editor (Medical):   Marland Kitchen Lack of Transportation (Non-Medical):   Physical Activity:   . Days of Exercise per Week:   . Minutes of Exercise per Session:   Stress:   . Feeling of Stress :   Social Connections:   . Frequency of Communication with Friends and Family:   . Frequency of Social Gatherings with Friends and Family:   . Attends Religious Services:   . Active Member of Clubs or Organizations:   . Attends Archivist Meetings:   Marland Kitchen Marital Status:     Allergies: No Known Allergies  Metabolic Disorder Labs: Lab Results  Component Value Date   HGBA1C 4.9 10/29/2017   MPG 93.93 10/29/2017   Lab Results  Component Value Date   PROLACTIN 14.3 06/30/2019   PROLACTIN 17.9 (H) 05/05/2019   Lab Results  Component Value Date   CHOL 162 10/29/2017  TRIG 192 (H) 10/29/2017   HDL 35 (L) 10/29/2017   CHOLHDL 4.6 10/29/2017   VLDL 38 10/29/2017   LDLCALC 89 10/29/2017   Lab Results  Component Value Date   TSH 3.55 06/30/2019   TSH 4.87 (H) 04/30/2019    Therapeutic Level Labs: No results found for: LITHIUM No results found for: VALPROATE No components found for:  CBMZ  Current Medications: Current Outpatient Medications  Medication Sig Dispense Refill  . acetaminophen (TYLENOL) 500 MG tablet Take 500 mg by mouth every 6 (six) hours as needed.    . ALPRAZolam (XANAX) 0.5 MG tablet Take 0.5 mg by mouth at bedtime as needed for anxiety.    . bifidobacterium infantis (ALIGN) capsule Take 1 capsule by mouth daily.    . calcitRIOL  (ROCALTROL) 0.5 MCG capsule TAKE TWO CAPSULES BY MOUTH DAILY 60 capsule 2  . Calcium Carbonate-Vit D-Min (CALCIUM 1200 PO) Take 2 capsules by mouth daily.     . Cholecalciferol (VITAMIN D) 50 MCG (2000 UT) tablet Take 2,000 Units by mouth daily.    . citalopram (CELEXA) 40 MG tablet Take 1 tablet (40 mg total) by mouth daily. 90 tablet 1  . DESCOVY 200-25 MG tablet Take 1 tablet by mouth daily.    Marland Kitchen doxepin (SINEQUAN) 10 MG capsule Take 1 to 2 capsules at bedtime as needed for sleep 180 capsule 1  . ibuprofen (ADVIL) 200 MG tablet Take 200 mg by mouth every 6 (six) hours as needed.    . ISOtretinoin (ACCUTANE) 30 MG capsule Take 2 capsules (60 mg total) by mouth daily. Take with fatty meal 60 capsule 0  . levothyroxine (SYNTHROID) 75 MCG tablet Take 1 tablet (75 mcg total) by mouth daily before breakfast. 90 tablet 0  . meloxicam (MOBIC) 15 MG tablet Take 1 tablet (15 mg total) by mouth daily. 30 tablet 3  . testosterone (ANDRODERM) 4 MG/24HR PT24 patch Place 1 patch onto the skin daily. 30 patch 2  . testosterone (ANDRODERM) 4 MG/24HR PT24 patch      No current facility-administered medications for this visit.     Psychiatric Specialty Exam: Review of Systems  There were no vitals taken for this visit.There is no height or weight on file to calculate BMI.  General Appearance: Well Groomed  Eye Contact:  Good  Speech:  Clear and Coherent and Normal Rate  Volume:  Normal  Mood:  Euthymic  Affect:  Congruent  Thought Process:  Goal Directed, Linear and Descriptions of Associations: Intact  Orientation:  Full (Time, Place, and Person)  Thought Content: Logical   Suicidal Thoughts:  No  Homicidal Thoughts:  No  Memory:  Recent;   Good Remote;   Good  Judgement:  Good  Insight:  Good  Psychomotor Activity:  Normal  Concentration:  Concentration: Good and Attention Span: Good  Recall:  Good  Fund of Knowledge: Good  Language: Good  Akathisia:  Negative  Handed:  Right  AIMS (if  indicated): not done  Assets:  Communication Skills Desire for Improvement Financial Resources/Insurance Housing  ADL's:  Intact  Cognition: WNL  Sleep:  Good    Screenings: AIMS     Admission (Discharged) from 10/29/2017 in [facility] Total Score  0    AUDIT     Admission (Discharged) from 10/29/2017 in [facility]  Alcohol Use Disorder Identification Test Final Score (AUDIT)  2       Assessment and Plan: Pt appears to be  stable on his current regimen.  1. MDD (major depressive disorder), recurrent, in full remission ([place])  - doxepin (SINEQUAN) 10 MG capsule; Take 1 to 2 capsules at bedtime as needed for sleep  Dispense: 180 capsule; Refill: 1 - citalopram (CELEXA) 40 MG tablet; Take 1 tablet (40 mg total) by mouth daily.  Dispense: 90 tablet; Refill: 1    Nevada Crane, MD 08/17/2019, 1:36 PM

## 2019-08-18 ENCOUNTER — Other Ambulatory Visit: Payer: Self-pay

## 2019-08-19 ENCOUNTER — Encounter: Payer: Self-pay | Admitting: Endocrinology

## 2019-08-19 ENCOUNTER — Ambulatory Visit (INDEPENDENT_AMBULATORY_CARE_PROVIDER_SITE_OTHER): Payer: BC Managed Care – PPO | Admitting: Endocrinology

## 2019-08-19 VITALS — BP 110/70 | HR 83 | Ht 67.0 in | Wt 185.6 lb

## 2019-08-19 DIAGNOSIS — E201 Pseudohypoparathyroidism: Secondary | ICD-10-CM

## 2019-08-19 DIAGNOSIS — E038 Other specified hypothyroidism: Secondary | ICD-10-CM

## 2019-08-19 DIAGNOSIS — R5383 Other fatigue: Secondary | ICD-10-CM

## 2019-08-19 DIAGNOSIS — R7989 Other specified abnormal findings of blood chemistry: Secondary | ICD-10-CM | POA: Diagnosis not present

## 2019-08-19 LAB — CBC
HCT: 52 % (ref 39.0–52.0)
Hemoglobin: 17.4 g/dL — ABNORMAL HIGH (ref 13.0–17.0)
MCHC: 33.5 g/dL (ref 30.0–36.0)
MCV: 93 fl (ref 78.0–100.0)
Platelets: 218 10*3/uL (ref 150.0–400.0)
RBC: 5.59 Mil/uL (ref 4.22–5.81)
RDW: 14.5 % (ref 11.5–15.5)
WBC: 7.9 10*3/uL (ref 4.0–10.5)

## 2019-08-19 LAB — COMPREHENSIVE METABOLIC PANEL
ALT: 23 U/L (ref 0–53)
AST: 20 U/L (ref 0–37)
Albumin: 4.7 g/dL (ref 3.5–5.2)
Alkaline Phosphatase: 100 U/L (ref 39–117)
BUN: 12 mg/dL (ref 6–23)
CO2: 30 mEq/L (ref 19–32)
Calcium: 9.9 mg/dL (ref 8.4–10.5)
Chloride: 102 mEq/L (ref 96–112)
Creatinine, Ser: 1.03 mg/dL (ref 0.40–1.50)
GFR: 87.25 mL/min (ref >60.00)
Glucose, Bld: 86 mg/dL (ref 70–99)
Potassium: 4.2 mEq/L (ref 3.5–5.1)
Sodium: 140 mEq/L (ref 135–145)
Total Bilirubin: 0.7 mg/dL (ref 0.2–1.2)
Total Protein: 7.5 g/dL (ref 6.0–8.3)

## 2019-08-19 LAB — TESTOSTERONE: Testosterone: 445.12 ng/dL (ref 300.00–890.00)

## 2019-08-19 LAB — T4, FREE: Free T4: 0.91 ng/dL (ref 0.60–1.60)

## 2019-08-19 LAB — TSH: TSH: 2.65 u[IU]/mL (ref 0.35–4.50)

## 2019-08-19 NOTE — Progress Notes (Signed)
Patient ID: Kevin Ramos, male   DOB: 23-Oct-1995, 24 y.o.   MRN: [id number]             Chief complaint: Endocrinology follow-up  History of Present Illness:  Problem 1: HYPOCALCEMIA:  RECENT history:  Since 4/21 he has been on 0.5 mcg of calcitriol Also takes 1 tablets of 1200 mg calcium twice a day In 2021 he has required lower doses of calcitriol and last calcium was slightly high  No recent symptoms of tingling in the hands or face cramping or stiffness and no weakness.    Also previously had issues with hypokalemia which had resolved  No history of hypomagnesemia  He has been regular with his regimen lately and labs are pending from today  He has also been advised vitamin D supplementation for low baseline level of 21   Lab Results  Component Value Date   CALCIUM 10.6 (H) 06/30/2019   CALCIUM 9.9 04/30/2019   CALCIUM 10.0 03/25/2019    VITAMIN D deficiency: He is taking vitamin D3, 2000 units daily  Lab Results  Component Value Date   VD25OH 20.83 (L) 03/25/2019   VD25OH 30.7 10/29/2017   VD25OH 41.22 12/27/2016   HYPOTHYROIDISM:  He had thyroid functions checked because of fatigue in 12/20 Free T4 was low normal at 0.63 as of 2/21 With his TSH is mildly increased he was told to start taking levothyroxine 25 mcg daily  However he continued to have fatigue and sleepiness Recently has had less difficulties with sleep at night No cold intolerance Also recently weight is coming down  With his TSH also being consistently mildly increased he was told to go up to 75 mcg levothyroxine He has been trying to take this before breakfast daily  He has less fatigue now although still present and his sleep difficulties are better with doxepin  Lab Results  Component Value Date   TSH 3.55 06/30/2019   TSH 4.87 (H) 04/30/2019   TSH 5.14 (H) 03/25/2019   FREET4 0.87 06/30/2019   FREET4 0.63 04/30/2019   FREET4 0.74 03/25/2019   Problem 2: HYPOGONADISM:   Because of his significant fatigue, lack of energy and motivation testosterone was checked and free testosterone was low on 2 occasions LH normal along with prolactin  He has been consistently using his 4 mg Androderm for the last 6 weeks He feels less fatigue Last testosterone level excellent  Lab Results  Component Value Date   TESTOSTERONE 577.3 06/30/2019   TESTOSTERONE 253 (L) 05/05/2019   TESTOSTERONE 284 04/30/2019   Lab Results  Component Value Date   TESTOFREE 6.7 (L) 05/05/2019   TESTOFREE 7.5 (L) 04/30/2019      Past Medical History:  Diagnosis Date  . Acne   . Depression   . Dysplastic nevus 02/09/2019   right upper back    No past surgical history on file.  Family History  Problem Relation Age of Onset  . Hypoparathyroidism Mother     Social History:  reports that he has never smoked. He has never used smokeless tobacco. He reports that he does not use drugs. No history on file for alcohol.   Allergies: No Known Allergies  Allergies as of 08/19/2019   No Known Allergies     Medication List       Accurate as of Aug 19, 2019 10:33 AM. If you have any questions, ask your nurse or doctor.        acetaminophen 500 MG tablet Commonly known  as: TYLENOL Take 500 mg by mouth every 6 (six) hours as needed.   ALPRAZolam 0.5 MG tablet Commonly known as: XANAX Take 0.5 mg by mouth at bedtime as needed for anxiety.   Androderm 4 MG/24HR Pt24 patch Generic drug: testosterone Place 1 patch onto the skin daily. What changed: Another medication with the same name was removed. Continue taking this medication, and follow the directions you see here. Changed by: Elayne Snare, MD   bifidobacterium infantis capsule Take 1 capsule by mouth daily.   calcitRIOL 0.5 MCG capsule Commonly known as: ROCALTROL TAKE TWO CAPSULES BY MOUTH DAILY   CALCIUM 1200 PO Take 2 capsules by mouth daily.   citalopram 40 MG tablet Commonly known as: CELEXA Take 1 tablet  (40 mg total) by mouth daily.   Descovy 200-25 MG tablet Generic drug: emtricitabine-tenofovir AF Take 1 tablet by mouth daily.   doxepin 10 MG capsule Commonly known as: SINEQUAN Take 1 to 2 capsules at bedtime as needed for sleep   ibuprofen 200 MG tablet Commonly known as: ADVIL Take 200 mg by mouth every 6 (six) hours as needed.   ISOtretinoin 30 MG capsule Commonly known as: ACCUTANE Take 2 capsules (60 mg total) by mouth daily. Take with fatty meal   levothyroxine 75 MCG tablet Commonly known as: SYNTHROID Take 1 tablet (75 mcg total) by mouth daily before breakfast.   meloxicam 15 MG tablet Commonly known as: MOBIC Take 1 tablet (15 mg total) by mouth daily.   Vitamin D 50 MCG (2000 UT) tablet Take 2,000 Units by mouth daily.           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 Previously adequately replaced with 4 mg Androderm which he is taking regularly and tolerating well  FATIGUE is likely multifactorial and recently unlikely from endocrine causes unless his thyroid level has gone lower again  Also is getting treatment for his fibromyalgia and sleep disturbance as well as depression   PLAN:   Recheck labs including basic metabolic panel, thyroid levels including free T4 TSH, CBC and testosterone  Calcitriol dose will be adjusted based on his calcium levels as also the other  medications  Follow-up in 3 months unless a change needed Reminded him to take his thyroid supplement without calcium at the same time  Elayne Snare 08/19/2019, 10:33 AM

## 2019-08-20 ENCOUNTER — Ambulatory Visit: Payer: BC Managed Care – PPO | Admitting: Dermatology

## 2019-08-20 ENCOUNTER — Other Ambulatory Visit: Payer: Self-pay

## 2019-08-20 VITALS — Wt 185.6 lb

## 2019-08-20 DIAGNOSIS — Z79899 Other long term (current) drug therapy: Secondary | ICD-10-CM | POA: Diagnosis not present

## 2019-08-20 DIAGNOSIS — L905 Scar conditions and fibrosis of skin: Secondary | ICD-10-CM | POA: Diagnosis not present

## 2019-08-20 DIAGNOSIS — L853 Xerosis cutis: Secondary | ICD-10-CM | POA: Diagnosis not present

## 2019-08-20 DIAGNOSIS — K130 Diseases of lips: Secondary | ICD-10-CM

## 2019-08-20 DIAGNOSIS — L700 Acne vulgaris: Secondary | ICD-10-CM

## 2019-08-20 MED ORDER — TACROLIMUS 0.1 % EX OINT: TOPICAL_OINTMENT | Freq: Two times a day (BID) | CUTANEOUS | 3 refills | Status: DC

## 2019-08-20 MED ORDER — ISOTRETINOIN 40 MG PO CAPS: 80.0000 mg | ORAL_CAPSULE | Freq: Every day | ORAL | 0 refills | Status: AC

## 2019-08-20 NOTE — Patient Instructions (Signed)
Recommend daily broad spectrum sunscreen SPF 30+ to sun-exposed areas, reapply every 2 hours as needed. Call for new or changing lesions. ° °While taking isotretinoin, do not share pills and do not donate blood. Isotretinoin is best absorbed when taken with a fatty meal. Isotretinoin can make you sensitive to the sun. Daily careful sun protection including sunscreen SPF 30+ when outdoors is recommended. ° °

## 2019-08-20 NOTE — Progress Notes (Signed)
   Isotretinoin Follow-Up Visit    Subjective  Kevin Ramos is a 24 y.o. male who presents for the following: Acne (Patient on week 32 of Accutane, taking 60mg  QD, experiencing dry lips, dry skin, dry eye and dry nose. no other side effected noted. Patient is using Protopic for dry lips and skin).  Week # 32  Isotretinoin F/U - 08/20/19 0800      Isotretinoin Follow Up   Weight  185 lb 9.6 oz (84.2 kg)    Acne breakouts since last visit?  No      Side Effects   Skin  Chapped Lips;Dry Eyes;Dry Lips;Dry Skin;Dry Nose    Gastrointestinal  WNL    Neurological  WNL    Constitutional  WNL       Side effects: Dry skin, dry lips  Denies changes in night vision, shortness of breath, abdominal pain, nausea, vomiting, diarrhea, blood in stool or urine, visual changes, headaches, epistaxis, joint pain, myalgias, mood changes, depression, or suicidal ideation.   The following portions of the chart were reviewed this encounter and updated as appropriate: medications, allergies, medical history  Review of Systems:  No other skin or systemic complaints except as noted in HPI or Assessment and Plan.  Objective  Well appearing patient in no apparent distress; mood and affect are within normal limits.  An examination of the face, neck, chest, and back was performed and relevant findings are noted below.   Objective  Head, chest and back: Scarring on neck and shoulders, 1 active papule on Back and neck Scarring on Face, no active papules on Face   Assessment & Plan   Acne vulgaris - severe with scarring - on Isotretinoin - 2nd course wk #32 Head, chest and back  Increase and Start Isotretinoin 40mg  2 po qd #60 0rRF, will continue for at least one more month.   Recommend CeraVe cream for Dry skin Continue Protopic for dry lips and face    ISOtretinoin (ACCUTANE) 40 MG capsule - Head, chest and back   While taking Isotretinoin, do note share pills, and for 30 days after  you finish the medication, do not donate blood. Isotretinoin is best absorbed when taken with a fatty meal. Isotretinoin can make you sensitive to the sun. Daily careful sun protection including sunscreen SPF 30+ when outdoors is recommended.  Xerosis - Continue emollients as directed  Cheilitis - Continue lip balm as directed, Dr. Luvenia Heller Cortibalm recommended And Protopic prn.  Follow-up in 30 days.   Marene Lenz, CMA, am acting as scribe for Sarina Ser, MD . Documentation: I have reviewed the above documentation for accuracy and completeness, and I agree with the above.  Sarina Ser, MD

## 2019-08-20 NOTE — Progress Notes (Signed)
Please call to let patient know that the all results are normal except slight increase in red blood cells but not as much as a year ago and no change in any medication  needed

## 2019-08-29 ENCOUNTER — Encounter: Payer: Self-pay | Admitting: Dermatology

## 2019-09-01 ENCOUNTER — Ambulatory Visit: Payer: BC Managed Care – PPO | Admitting: Endocrinology

## 2019-09-02 ENCOUNTER — Other Ambulatory Visit: Payer: Self-pay | Admitting: Endocrinology

## 2019-09-07 ENCOUNTER — Other Ambulatory Visit: Payer: Self-pay | Admitting: Endocrinology

## 2019-09-07 NOTE — Telephone Encounter (Signed)
Please refill if appropriate

## 2019-09-10 ENCOUNTER — Telehealth: Payer: Self-pay

## 2019-09-10 NOTE — Telephone Encounter (Signed)
PRIOR AUTHORIZATION  PA initiation date: 09/10/19  Medication: Androderm 4mg  patch Insurance Company: Public librarian completed electronically through [employer] My Meds: Yes  Will await insurance response re: approval/denial.  Requan Merkey (Key: [id number])  Your information has been sent to [place]. [place] Key: [id number] - PA Case ID: [id number] Need help? Call us at [phone] Status Sent to [facility] Androderm 4MG /24HR 24 hr patches Form [employer] HIM Electronic Prior Authorization Form ([employer]) 2017 NCPDP Ric Callender (Key: [id number])  [place] has not yet replied to your PA request. You may close this dialog, return to your dashboard, and perform other tasks.  To check for an update later, open this request again from your dashboard.  If [facility] has not replied to your request within 24 hours please contact [place] at [phone].

## 2019-09-10 NOTE — Telephone Encounter (Signed)
PRIOR AUTHORIZATION - ON HOLD  PA initiation date: 09/10/19  Medication: Androderm 4mg /24hr Insurance Company: [employer] completed electronically through [employer] My Meds: Yes  No eligibility was found. Please reconfirm the patient's plan before submitting.  My Chart message sent as follows:  Kevin Ramos,  We are in the process of completing your prior auth for Androderm and encountered an issue. It appears the insurance we have on file is no longer active. [employer] sent a response indicating "NO ELIGIBILITY". In order to process the PA, we will need to get a copy of your current insurance card. Please upload a copy of BOTH front and back of your new card to this message. Once received, we will process your PA according to our policy and procedures.  Thank you

## 2019-09-14 NOTE — Telephone Encounter (Signed)
APPROVAL  Medication: Androderm 4mg  patch Insurance Company: [employer] PA response: APPROVED  Document has been labeled and placed in scan file for HIM and for our future reference.

## 2019-09-21 ENCOUNTER — Other Ambulatory Visit: Payer: Self-pay

## 2019-09-21 ENCOUNTER — Encounter: Payer: Self-pay | Admitting: Podiatry

## 2019-09-21 ENCOUNTER — Ambulatory Visit (INDEPENDENT_AMBULATORY_CARE_PROVIDER_SITE_OTHER): Payer: PRIVATE HEALTH INSURANCE | Admitting: Podiatry

## 2019-09-21 DIAGNOSIS — L600 Ingrowing nail: Secondary | ICD-10-CM

## 2019-09-21 DIAGNOSIS — M79674 Pain in right toe(s): Secondary | ICD-10-CM | POA: Diagnosis not present

## 2019-09-21 DIAGNOSIS — J3089 Other allergic rhinitis: Secondary | ICD-10-CM | POA: Insufficient documentation

## 2019-09-21 DIAGNOSIS — M778 Other enthesopathies, not elsewhere classified: Secondary | ICD-10-CM

## 2019-09-21 NOTE — Progress Notes (Signed)
Subjective:  Patient ID: Kevin Ramos, male    DOB: 24-Nov-1995,  MRN: [id number]  Chief Complaint  Patient presents with  . Foot Pain    "The pain has come back where he gave me the injection back in November.."  . Toe Pain    "I think my right big toe is an infected ingrown toenail."    24 y.o. male presents with the above complaint.  Patient presents with painful right medial hallux ingrown.  Patient states been very painful to touch.  Patient states that is painful when ambulating.  He would like to have it removed.  He denies any other acute complaints.  He also has pain on the anterior ankle as the extensor tendon courses along the ankle joint.  He states that it flared back up.  The injection and treatment last time helped aggressively.  He would like another steroid injection to help take some of the stress away.  He denies any other acute complaints.  He denies seeing anyone else prior to seeing me for this.   Review of Systems: Negative except as noted in the HPI. Denies N/V/F/Ch.  Past Medical History:  Diagnosis Date  . Acne   . Depression   . Dysplastic nevus 02/09/2019   right upper back    Current Outpatient Medications:  .  acetaminophen (TYLENOL) 500 MG tablet, Take 500 mg by mouth every 6 (six) hours as needed., Disp: , Rfl:  .  ALPRAZolam (XANAX) 0.5 MG tablet, Take 0.5 mg by mouth at bedtime as needed for anxiety., Disp: , Rfl:  .  ANDRODERM 4 MG/24HR PT24 patch, APPLY ONE PATCH TO THE SKIN DAILY, Disp: 30 patch, Rfl: 3 .  bifidobacterium infantis (ALIGN) capsule, Take 1 capsule by mouth daily., Disp: , Rfl:  .  calcitRIOL (ROCALTROL) 0.5 MCG capsule, TAKE TWO CAPSULES BY MOUTH DAILY, Disp: 60 capsule, Rfl: 2 .  Calcium Carbonate-Vit D-Min (CALCIUM 1200 PO), Take 2 capsules by mouth daily. , Disp: , Rfl:  .  Cholecalciferol (VITAMIN D) 50 MCG (2000 UT) tablet, Take 2,000 Units by mouth daily., Disp: , Rfl:  .  citalopram (CELEXA) 40 MG tablet, Take 1 tablet  (40 mg total) by mouth daily., Disp: 90 tablet, Rfl: 1 .  DESCOVY 200-25 MG tablet, Take 1 tablet by mouth daily., Disp: , Rfl:  .  doxepin (SINEQUAN) 10 MG capsule, Take 1 to 2 capsules at bedtime as needed for sleep, Disp: 180 capsule, Rfl: 1 .  ibuprofen (ADVIL) 200 MG tablet, Take 200 mg by mouth every 6 (six) hours as needed., Disp: , Rfl:  .  levothyroxine (SYNTHROID) 75 MCG tablet, TAKE ONE TABLET BY MOUTH DAILY BEFORE BREAKFAST, Disp: 30 tablet, Rfl: 0 .  meloxicam (MOBIC) 15 MG tablet, Take 1 tablet (15 mg total) by mouth daily., Disp: 30 tablet, Rfl: 3 .  pimecrolimus (ELIDEL) 1 % cream, Apply topically., Disp: , Rfl:  .  tacrolimus (PROTOPIC) 0.1 % ointment, Apply topically 2 (two) times daily., Disp: 100 g, Rfl: 3  Social History   Tobacco Use  Smoking Status Never Smoker  Smokeless Tobacco Never Used    No Known Allergies Objective:  There were no vitals filed for this visit. There is no height or weight on file to calculate BMI. Constitutional Well developed. Well nourished.  Vascular Dorsalis pedis pulses palpable bilaterally. Posterior tibial pulses palpable bilaterally. Capillary refill normal to all digits.  No cyanosis or clubbing noted. Pedal hair growth normal.  Neurologic Normal speech.  Oriented to person, place, and time. Epicritic sensation to light touch grossly present bilaterally.  Dermatologic Painful ingrowing nail at medial nail borders of the hallux nail right. No other open wounds. No skin lesions.  Orthopedic: Normal joint ROM without pain or crepitus bilaterally. No visible deformities. No bony tenderness.   Radiographs: None Assessment:   1. Extensor tendinitis of foot   2. Ingrown right greater toenail   3. Great toe pain, right    Plan:  Patient was evaluated and treated and all questions answered.  Ingrown Nail, right -Patient elects to proceed with minor surgery to remove ingrown toenail removal today. Consent reviewed and signed  by patient. -Ingrown nail excised. See procedure note. -Educated on post-procedure care including soaking. Written instructions provided and reviewed. -Patient to follow up in 2 weeks for nail check.  Right extensor tendinitis -I explained to the patient the etiology of tendinitis and various treatment options were extensively discussed.  I believe that patient will benefit more steroid injection to help decrease the acute inflammatory component associated pain.  I also discussed with the patient there is a high risk of rupture associated with it.  Patient would like to proceed with the injection despite the risk of rupture. -A steroid injection was performed at right anterior ankle using 1% plain Lidocaine and 10 mg of Kenalog. This was well tolerated.   Procedure: Excision of Ingrown Toenail Location: Right 1st toe medial nail borders. Anesthesia: Lidocaine 1% plain; 1.5 mL and Marcaine 0.5% plain; 1.5 mL, digital block. Skin Prep: Betadine. Dressing: Silvadene; telfa; dry, sterile, compression dressing. Technique: Following skin prep, the toe was exsanguinated and a tourniquet was secured at the base of the toe. The affected nail border was freed, split with a nail splitter, and excised. Chemical matrixectomy was then performed with phenol and irrigated out with alcohol. The tourniquet was then removed and sterile dressing applied. Disposition: Patient tolerated procedure well. Patient to return in 2 weeks for follow-up.   No follow-ups on file.

## 2019-09-22 DIAGNOSIS — M9111 Juvenile osteochondrosis of head of femur [Legg-Calve-Perthes], right leg: Secondary | ICD-10-CM | POA: Insufficient documentation

## 2019-09-23 ENCOUNTER — Encounter: Payer: Self-pay | Admitting: Dermatology

## 2019-09-23 ENCOUNTER — Other Ambulatory Visit: Payer: Self-pay

## 2019-09-23 ENCOUNTER — Ambulatory Visit (INDEPENDENT_AMBULATORY_CARE_PROVIDER_SITE_OTHER): Payer: PRIVATE HEALTH INSURANCE | Admitting: Dermatology

## 2019-09-23 VITALS — Wt 185.0 lb

## 2019-09-23 DIAGNOSIS — L853 Xerosis cutis: Secondary | ICD-10-CM

## 2019-09-23 DIAGNOSIS — D235 Other benign neoplasm of skin of trunk: Secondary | ICD-10-CM

## 2019-09-23 DIAGNOSIS — L905 Scar conditions and fibrosis of skin: Secondary | ICD-10-CM | POA: Diagnosis not present

## 2019-09-23 DIAGNOSIS — D239 Other benign neoplasm of skin, unspecified: Secondary | ICD-10-CM

## 2019-09-23 DIAGNOSIS — K130 Diseases of lips: Secondary | ICD-10-CM | POA: Diagnosis not present

## 2019-09-23 DIAGNOSIS — L700 Acne vulgaris: Secondary | ICD-10-CM

## 2019-09-23 DIAGNOSIS — Z79899 Other long term (current) drug therapy: Secondary | ICD-10-CM

## 2019-09-23 DIAGNOSIS — L731 Pseudofolliculitis barbae: Secondary | ICD-10-CM

## 2019-09-23 MED ORDER — ISOTRETINOIN 40 MG PO CAPS: ORAL_CAPSULE | ORAL | 0 refills | Status: DC

## 2019-09-23 NOTE — Progress Notes (Signed)
   Follow-Up Visit   Subjective  Kevin Ramos is a 24 y.o. male who presents for the following: Acne (Isotretinoin 40mg  2 po QD - patient still breaking out especially on the neck area.).  The following portions of the chart were reviewed this encounter and updated as appropriate:  Tobacco  Allergies  Meds  Problems  Med Hx  Surg Hx  Fam Hx     Review of Systems:  No other skin or systemic complaints except as noted in HPI or Assessment and Plan.  Objective  Well appearing patient in no apparent distress; mood and affect are within normal limits.  A focused examination was performed including face, neck, chest and back. Relevant physical exam findings are noted in the Assessment and Plan.  Objective  Face, chest, back: Scarring on the neck, back, and face. Resolving papules on the post neck.  Objective  Right Upper Back: 0.2 cm Re-pigmented brown macule   Assessment & Plan     Isotretinoin F/U - 09/23/19 0800      Isotretinoin Follow Up   iPledge # [id number]    Weight 185 lb (83.9 kg)    Acne breakouts since last visit? Yes      Side Effects   Skin Chapped Lips;Dry Lips;Dry Nose;Dry Skin;Sunburn    Gastrointestinal WNL    Neurological WNL    Constitutional WNL           Acne vulgaris Face, chest, back  Isotretinoin week 36 - severe with scarring, this is not the patient's first  course of Isotretinoin Continue Isotretinoin 40mg  2 po QD. #60 0Rf. Continue treatment for another 1-2 months.   ISOtretinoin (ACCUTANE) 40 MG capsule - Face, chest, back  Dysplastic nevus Right Upper Back  Epidermal / dermal shaving - Right Upper Back  Lesion diameter (cm):  0.2 Informed consent: discussed and consent obtained   Timeout: patient name, date of birth, surgical site, and procedure verified   Procedure prep:  Patient was prepped and draped in usual sterile fashion Prep type:  Isopropyl alcohol Anesthesia: the lesion was anesthetized in a standard  fashion   Anesthetic:  1% lidocaine w/ epinephrine 1-100,000 buffered w/ 8.4% NaHCO3 Instrument used: flexible razor blade   Hemostasis achieved with: pressure, aluminum chloride and electrodesiccation   Outcome: patient tolerated procedure well   Post-procedure details: sterile dressing applied and wound care instructions given   Dressing type: bandage and petrolatum   Additional details:  Post tx defect 0.6 cm   Pseudofolliculitis barbae Face  Recommend Vanicream shaving cream and Gillette Skin Guard razor.   Cheilitis Secondary to Isotretinoin Treatment - Continue lip balm as directed, Dr. Luvenia Heller Cortibalm or Aquaphor recommended - Continue Protopic daily.  Xerosis Secondary to Isotretinoin Treatment  - diffuse xerotic patches - recommend gentle, hydrating skin care - gentle skin care handout given   Return in about 1 month (around 10/23/2019).  [name], CMA, am acting as scribe for Sarina Ser, MD .  Documentation: I have reviewed the above documentation for accuracy and completeness, and I agree with the above.  Sarina Ser, MD

## 2019-09-23 NOTE — Patient Instructions (Signed)

## 2019-10-05 ENCOUNTER — Encounter: Payer: Self-pay | Admitting: Dermatology

## 2019-10-10 DIAGNOSIS — M1611 Unilateral primary osteoarthritis, right hip: Secondary | ICD-10-CM | POA: Insufficient documentation

## 2019-10-12 DIAGNOSIS — J343 Hypertrophy of nasal turbinates: Secondary | ICD-10-CM | POA: Insufficient documentation

## 2019-10-12 DIAGNOSIS — J342 Deviated nasal septum: Secondary | ICD-10-CM | POA: Insufficient documentation

## 2019-10-15 ENCOUNTER — Other Ambulatory Visit: Payer: Self-pay

## 2019-10-15 MED ORDER — ACCUTANE 40 MG PO CAPS: 40.0000 mg | ORAL_CAPSULE | Freq: Two times a day (BID) | ORAL | 0 refills | Status: DC

## 2019-10-15 NOTE — Telephone Encounter (Signed)
Isotretinoin denied through pts insurance, tried 3 different appeals all denied.   Discussed with pt, we will try brand Accutane at [facility] which should cost pt $60 out of pocket charge.   erx'd Accutane 40 mg take 2 tablets daily #60 O RF sent to [place]

## 2019-10-26 ENCOUNTER — Other Ambulatory Visit: Payer: Self-pay

## 2019-10-26 ENCOUNTER — Ambulatory Visit (INDEPENDENT_AMBULATORY_CARE_PROVIDER_SITE_OTHER): Payer: PRIVATE HEALTH INSURANCE | Admitting: Dermatology

## 2019-10-26 VITALS — Wt 185.0 lb

## 2019-10-26 DIAGNOSIS — L700 Acne vulgaris: Secondary | ICD-10-CM | POA: Diagnosis not present

## 2019-10-26 DIAGNOSIS — Z79899 Other long term (current) drug therapy: Secondary | ICD-10-CM

## 2019-10-26 DIAGNOSIS — K130 Diseases of lips: Secondary | ICD-10-CM

## 2019-10-26 DIAGNOSIS — L853 Xerosis cutis: Secondary | ICD-10-CM | POA: Diagnosis not present

## 2019-10-26 NOTE — Progress Notes (Signed)
   Isotretinoin Follow-Up Visit   Subjective  Jrue Jarriel is a 24 y.o. male who presents for the following: Acne (Accutane therapy).  Patient presents today for follow up on Accutane Therapy, on week 40, taking 40 mg isotretinoin 2 caps p.o. QD. Patient also has history of re-pigmented nevus on R upper back, shave removal at last visit on 09/23/19. Patient has a Right hip replacement in 4 weeks.  Subjective  Mccoy Testa is a 24 y.o. male who presents for the following: Acne (Accutane therapy).  Week # 40  Isotretinoin F/U - 10/26/19 0900      Isotretinoin Follow Up   iPledge # [id number]    Date 10/26/19    Weight 185 lb (83.9 kg)    Acne breakouts since last visit? Yes      Dosage   Current (To Date) Dosage (mg) 80mg       Side Effects   Skin Chapped Lips;Dry Lips;Dry Skin;Sunburn    Gastrointestinal WNL    Neurological WNL    Constitutional WNL             Side effects: Dry skin, dry lips  Denies changes in night vision, shortness of breath, abdominal pain, nausea, vomiting, diarrhea, blood in stool or urine, visual changes, headaches, epistaxis, joint pain, myalgias, mood changes, depression, or suicidal ideation.   The following portions of the chart were reviewed this encounter and updated as appropriate: medications, allergies, medical history  Review of Systems:  No other skin or systemic complaints except as noted in HPI or Assessment and Plan.  Objective  Well appearing patient in no apparent distress; mood and affect are within normal limits.  An examination of the face, neck, chest, and back was performed and relevant findings are noted below.   Objective  Face, Neck, back: 1 active papule on Back and neck  Scarring on Face, chest, back, neck, and shoulders   Assessment & Plan   Acne vulgaris, severe - on systemic Isotretinoin week #40 Face, Neck, back  Did have a peer to peer review with insurance company about isotretinoin,  which insurance company ultimately denied. Was able to get medication for patient this past month at a reasonable cash price   Discussed  finishing all isotretinoin prior to right hip replacement and to discontinue medication during subsequent healing process. Will return in 3 months to discuss restarting medication if needed for acne.  Other Related Medications ISOtretinoin (ACCUTANE) 40 MG capsule   While taking Isotretinoin, do note share pills, and for 30 days after you finish the medication, do not donate blood. Isotretinoin is best absorbed when taken with a fatty meal. Isotretinoin can make you sensitive to the sun. Daily careful sun protection including sunscreen SPF 30+ when outdoors is recommended.  Xerosis - Continue emollients as directed  Cheilitis - Continue lip balm as directed, Dr. Luvenia Heller Cortibalm recommended  Follow-up in 30 days.  Marene Lenz, CMA, am acting as scribe for Sarina Ser, MD . Documentation: I have reviewed the above documentation for accuracy and completeness, and I agree with the above.  Sarina Ser, MD

## 2019-10-26 NOTE — Patient Instructions (Addendum)
Recommend daily broad spectrum sunscreen SPF 30+ to sun-exposed areas, reapply every 2 hours as needed. Call for new or changing lesions.  

## 2019-10-27 ENCOUNTER — Encounter: Payer: Self-pay | Admitting: Dermatology

## 2019-11-12 ENCOUNTER — Telehealth (HOSPITAL_COMMUNITY): Payer: Self-pay | Admitting: Psychiatry

## 2019-11-17 ENCOUNTER — Encounter (HOSPITAL_COMMUNITY): Payer: Self-pay | Admitting: Psychiatry

## 2019-11-17 ENCOUNTER — Telehealth (INDEPENDENT_AMBULATORY_CARE_PROVIDER_SITE_OTHER): Payer: PRIVATE HEALTH INSURANCE | Admitting: Psychiatry

## 2019-11-17 ENCOUNTER — Other Ambulatory Visit: Payer: Self-pay

## 2019-11-17 DIAGNOSIS — F3342 Major depressive disorder, recurrent, in full remission: Secondary | ICD-10-CM

## 2019-11-17 MED ORDER — DOXEPIN HCL 10 MG PO CAPS: ORAL_CAPSULE | ORAL | 1 refills | Status: DC

## 2019-11-17 MED ORDER — CITALOPRAM HYDROBROMIDE 40 MG PO TABS: 40.0000 mg | ORAL_TABLET | Freq: Every day | ORAL | 1 refills | Status: DC

## 2019-11-17 NOTE — Progress Notes (Signed)
[facility] MD/PA/NP OP Progress Note  Virtual Visit via Video Note  I connected with Kevin Ramos on 11/17/19 at  3:40 PM EDT by a video enabled telemedicine application and verified that I am speaking with the correct person using two identifiers.  Location: Patient: Home Provider: Clinic   I discussed the limitations of evaluation and management by telemedicine and the availability of in person appointments. The patient expressed understanding and agreed to proceed.  I provided 14 minutes of non-face-to-face time during this encounter.    11/17/2019 3:42 PM Kevin Ramos  MRN:  [id number]  Chief Complaint: " I am doing well."  HPI: Pt informed that he is doing well for the most part.  He stated that he is undergoing a major surgery next week.  He informed that he has a chronic hip condition which have been bothering him for all these years and he recently found out that he will need a hip replacement surgery.  He is hoping the procedure will go well so that he can start a new chapter in his life without pain. He denied any other concerns pertaining to his mood at this point.   Visit Diagnosis:    ICD-10-CM   1. MDD (major depressive disorder), recurrent, in full remission ([place])  F33.42 citalopram (CELEXA) 40 MG tablet    doxepin (SINEQUAN) 10 MG capsule    Past Psychiatric History: MDD  Past Medical History:  Past Medical History:  Diagnosis Date  . Acne   . Depression   . Dysplastic nevus 02/09/2019   right upper back   No past surgical history on file.  Family Psychiatric History: denied  Family History:  Family History  Problem Relation Age of Onset  . Hypoparathyroidism Mother     Social History:  Social History   Socioeconomic History  . Marital status: Single    Spouse name: Not on file  . Number of children: Not on file  . Years of education: Not on file  . Highest education level: Not on file  Occupational History  . Not on file  Tobacco  Use  . Smoking status: Never Smoker  . Smokeless tobacco: Never Used  Substance and Sexual Activity  . Alcohol use: Not on file  . Drug use: No  . Sexual activity: Not on file  Other Topics Concern  . Not on file  Social History Narrative  . Not on file   Social Determinants of Health   Financial Resource Strain:   . Difficulty of Paying Living Expenses: Not on file  Food Insecurity:   . Worried About Charity fundraiser in the Last Year: Not on file  . Ran Out of Food in the Last Year: Not on file  Transportation Needs:   . Lack of Transportation (Medical): Not on file  . Lack of Transportation (Non-Medical): Not on file  Physical Activity:   . Days of Exercise per Week: Not on file  . Minutes of Exercise per Session: Not on file  Stress:   . Feeling of Stress : Not on file  Social Connections:   . Frequency of Communication with Friends and Family: Not on file  . Frequency of Social Gatherings with Friends and Family: Not on file  . Attends Religious Services: Not on file  . Active Member of Clubs or Organizations: Not on file  . Attends Archivist Meetings: Not on file  . Marital Status: Not on file    Allergies: No Known Allergies  Metabolic Disorder Labs: Lab Results  Component Value Date   HGBA1C 4.9 10/29/2017   MPG 93.93 10/29/2017   Lab Results  Component Value Date   PROLACTIN 14.3 06/30/2019   PROLACTIN 17.9 (H) 05/05/2019   Lab Results  Component Value Date   CHOL 162 10/29/2017   TRIG 192 (H) 10/29/2017   HDL 35 (L) 10/29/2017   CHOLHDL 4.6 10/29/2017   VLDL 38 10/29/2017   LDLCALC 89 10/29/2017   Lab Results  Component Value Date   TSH 2.65 08/19/2019   TSH 3.55 06/30/2019    Therapeutic Level Labs: No results found for: LITHIUM No results found for: VALPROATE No components found for:  CBMZ  Current Medications: Current Outpatient Medications  Medication Sig Dispense Refill  . ACCUTANE 40 MG capsule Take 1 capsule (40 mg  total) by mouth 2 (two) times daily. 60 capsule 0  . acetaminophen (TYLENOL) 500 MG tablet Take 500 mg by mouth every 6 (six) hours as needed.    . ALPRAZolam (XANAX) 0.5 MG tablet Take 0.5 mg by mouth at bedtime as needed for anxiety.    Marland Kitchen amoxicillin-clavulanate (AUGMENTIN) 875-125 MG tablet Take 1 tablet by mouth 2 (two) times daily.    Renae Gloss 4 MG/24HR PT24 patch APPLY ONE PATCH TO THE SKIN DAILY 30 patch 3  . bifidobacterium infantis (ALIGN) capsule Take 1 capsule by mouth daily.    . calcitRIOL (ROCALTROL) 0.5 MCG capsule TAKE TWO CAPSULES BY MOUTH DAILY 60 capsule 2  . Calcium Carbonate-Vit D-Min (CALCIUM 1200 PO) Take 2 capsules by mouth daily.     . Cholecalciferol (VITAMIN D) 50 MCG (2000 UT) tablet Take 2,000 Units by mouth daily.    . citalopram (CELEXA) 40 MG tablet Take 1 tablet (40 mg total) by mouth daily. 90 tablet 1  . DESCOVY 200-25 MG tablet Take 1 tablet by mouth daily.    Marland Kitchen doxepin (SINEQUAN) 10 MG capsule Take 1 to 2 capsules at bedtime as needed for sleep 180 capsule 1  . fluticasone (FLONASE) 50 MCG/ACT nasal spray Place into the nose.    . ibuprofen (ADVIL) 200 MG tablet Take 200 mg by mouth every 6 (six) hours as needed.    . ISOtretinoin (ACCUTANE) 40 MG capsule Take 2 tabs po QD 60 capsule 0  . ISOtretinoin (ACCUTANE) 40 MG capsule Take 80 mg by mouth daily.    Marland Kitchen levothyroxine (SYNTHROID) 75 MCG tablet TAKE ONE TABLET BY MOUTH DAILY BEFORE BREAKFAST 30 tablet 0  . meloxicam (MOBIC) 15 MG tablet Take 1 tablet (15 mg total) by mouth daily. 30 tablet 3  . meloxicam (MOBIC) 7.5 MG tablet Take 7.5 mg by mouth daily.    . pimecrolimus (ELIDEL) 1 % cream Apply topically.    . tacrolimus (PROTOPIC) 0.1 % ointment Apply topically 2 (two) times daily. 100 g 3   No current facility-administered medications for this visit.     Psychiatric Specialty Exam: Review of Systems  There were no vitals taken for this visit.There is no height or weight on file to calculate  BMI.  General Appearance: Well Groomed  Eye Contact:  Good  Speech:  Clear and Coherent and Normal Rate  Volume:  Normal  Mood:  Euthymic  Affect:  Congruent  Thought Process:  Goal Directed, Linear and Descriptions of Associations: Intact  Orientation:  Full (Time, Place, and Person)  Thought Content: Logical   Suicidal Thoughts:  No  Homicidal Thoughts:  No  Memory:  Recent;   Good  Remote;   Good  Judgement:  Good  Insight:  Good  Psychomotor Activity:  Normal  Concentration:  Concentration: Good and Attention Span: Good  Recall:  Good  Fund of Knowledge: Good  Language: Good  Akathisia:  Negative  Handed:  Right  AIMS (if indicated): not done  Assets:  Communication Skills Desire for Improvement Financial Resources/Insurance Housing  ADL's:  Intact  Cognition: WNL  Sleep:  Good    Screenings: AIMS     Admission (Discharged) from 10/29/2017 in [facility] Total Score 0    AUDIT     Admission (Discharged) from 10/29/2017 in [facility] [facility]  Alcohol Use Disorder Identification Test Final Score (AUDIT) 2       Assessment and Plan: Pt appears to be stable on his current regimen.  He said he will undergo major surgical procedure next week by [facility] orthopedic surgery.  1. MDD (major depressive disorder), recurrent, in full remission ([place])  - doxepin (SINEQUAN) 10 MG capsule; Take 1 to 2 capsules at bedtime as needed for sleep  Dispense: 180 capsule; Refill: 1 - citalopram (CELEXA) 40 MG tablet; Take 1 tablet (40 mg total) by mouth daily.  Dispense: 90 tablet; Refill: 1    N[place]ada Crane, MD 11/17/2019, 3:42 PM

## 2019-11-18 NOTE — Progress Notes (Deleted)
Patient ID: Kevin Ramos, male   DOB: [birth year], 24 y.o.   MRN: [id number]             Chief complaint: Endocrinology follow-up  History of Present Illness:  Problem 1: HYPOCALCEMIA:  RECENT history:  Since 4/21 he has been on 0.5 mcg of calcitriol Also takes 1 tablets of 1200 mg calcium twice a day In 2021 he has required lower doses of calcitriol and last calcium was slightly high  No recent symptoms of tingling in the hands or face cramping or stiffness and no weakness.    Also previously had issues with hypokalemia which had resolved  No history of hypomagnesemia  He has been regular with his regimen lately and labs are pending from today  He has also been advised vitamin D supplementation for low baseline level of 21   Lab Results  Component Value Date   CALCIUM 9.9 08/19/2019   CALCIUM 10.6 (H) 06/30/2019   CALCIUM 9.9 04/30/2019    VITAMIN D deficiency: He is taking vitamin D3, 2000 units daily  Lab Results  Component Value Date   VD25OH 20.83 (L) 03/25/2019   VD25OH 30.7 10/29/2017   VD25OH 41.22 12/27/2016   HYPOTHYROIDISM:  He had thyroid functions checked because of fatigue in 12/20 Free T4 was low normal at 0.63 as of 2/21 With his TSH is mildly increased he was told to start taking levothyroxine 25 mcg daily  However he continued to have fatigue and sleepiness Recently has had less difficulties with sleep at night No cold intolerance Also recently weight is coming down  With his TSH also being consistently mildly increased he was told to go up to 75 mcg levothyroxine He has been trying to take this before breakfast daily  He has less fatigue now although still present and his sleep difficulties are better with doxepin  Lab Results  Component Value Date   TSH 2.65 08/19/2019   TSH 3.55 06/30/2019   TSH 4.87 (H) 04/30/2019   FREET4 0.91 08/19/2019   FREET4 0.87 06/30/2019   FREET4 0.63 04/30/2019   Problem 2:  HYPOGONADISM:  Because of his significant fatigue, lack of energy and motivation testosterone was checked and free testosterone was low on 2 occasions LH normal along with prolactin  He has been consistently using his 4 mg Androderm for the last 6 weeks He feels less fatigue Last testosterone level excellent  Lab Results  Component Value Date   TESTOSTERONE 445.12 08/19/2019   TESTOSTERONE 577.3 06/30/2019   TESTOSTERONE 253 (L) 05/05/2019   TESTOSTERONE 284 04/30/2019   Lab Results  Component Value Date   TESTOFREE 6.7 (L) 05/05/2019   TESTOFREE 7.5 (L) 04/30/2019      Past Medical History:  Diagnosis Date   Acne    Depression    Dysplastic nevus 02/09/2019   right upper back    No past surgical history on file.  Family History  Problem Relation Age of Onset   Hypoparathyroidism Mother     Social History:  reports that he has never smoked. He has never used smokeless tobacco. He reports that he does not use drugs. No history on file for alcohol use.   Allergies: No Known Allergies  Allergies as of 11/19/2019   No Known Allergies     Medication List       Accurate as of November 18, 2019  9:48 PM. If you have any questions, ask your nurse or doctor.  acetaminophen 500 MG tablet Commonly known as: TYLENOL Take 500 mg by mouth every 6 (six) hours as needed.   ALPRAZolam 0.5 MG tablet Commonly known as: XANAX Take 0.5 mg by mouth at bedtime as needed for anxiety.   amoxicillin-clavulanate 875-125 MG tablet Commonly known as: AUGMENTIN Take 1 tablet by mouth 2 (two) times daily.   Androderm 4 MG/24HR Pt24 patch Generic drug: testosterone APPLY ONE PATCH TO THE SKIN DAILY   bifidobacterium infantis capsule Take 1 capsule by mouth daily.   calcitRIOL 0.5 MCG capsule Commonly known as: ROCALTROL TAKE TWO CAPSULES BY MOUTH DAILY   CALCIUM 1200 PO Take 2 capsules by mouth daily.   citalopram 40 MG tablet Commonly known as: CELEXA Take 1  tablet (40 mg total) by mouth daily.   Descovy 200-25 MG tablet Generic drug: emtricitabine-tenofovir AF Take 1 tablet by mouth daily.   doxepin 10 MG capsule Commonly known as: SINEQUAN Take 1 to 2 capsules at bedtime as needed for sleep   fluticasone 50 MCG/ACT nasal spray Commonly known as: FLONASE Place into the nose.   ibuprofen 200 MG tablet Commonly known as: ADVIL Take 200 mg by mouth every 6 (six) hours as needed.   ISOtretinoin 40 MG capsule Commonly known as: ACCUTANE Take 80 mg by mouth daily.   ISOtretinoin 40 MG capsule Commonly known as: ACCUTANE Take 2 tabs po QD   Accutane 40 MG capsule Generic drug: ISOtretinoin Take 1 capsule (40 mg total) by mouth 2 (two) times daily.   levothyroxine 75 MCG tablet Commonly known as: SYNTHROID TAKE ONE TABLET BY MOUTH DAILY BEFORE BREAKFAST   meloxicam 15 MG tablet Commonly known as: MOBIC Take 1 tablet (15 mg total) by mouth daily.   meloxicam 7.5 MG tablet Commonly known as: MOBIC Take 7.5 mg by mouth daily.   pimecrolimus 1 % cream Commonly known as: ELIDEL Apply topically.   tacrolimus 0.1 % ointment Commonly known as: PROTOPIC Apply topically 2 (two) times daily.   Vitamin D 50 MCG (2000 UT) tablet Take 2,000 Units by mouth daily.           Review of Systems  Weight gain: He is now starting to lose a little weight, still not able to exercise  Wt Readings from Last 3 Encounters:  10/26/19 185 lb (83.9 kg)  09/23/19 185 lb (83.9 kg)  08/20/19 185 lb 9.6 oz (84.2 kg)    He is being followed by rheumatologist and orthopedic surgeon  Currently taking citalopram and doxepin for depression  PHYSICAL EXAM:  There were no vitals taken for this visit.   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 Previously adequately replaced with 4 mg Androderm which he is taking regularly and tolerating well  FATIGUE is likely multifactorial and recently unlikely from endocrine causes unless his thyroid level has gone lower again  Also is getting treatment for his fibromyalgia and sleep disturbance as well as depression   PLAN:   Recheck labs including basic metabolic panel, thyroid levels including free T4 TSH, CBC and testosterone  Calcitriol dose will be adjusted based on his calcium levels as also the other medications  Follow-up in 3 months unless a change needed Reminded him to take his thyroid supplement without calcium at the same time  Elayne Snare 11/18/2019, 9:48 PM

## 2019-11-19 ENCOUNTER — Ambulatory Visit: Payer: BC Managed Care – PPO | Admitting: Endocrinology

## 2019-11-23 DIAGNOSIS — E876 Hypokalemia: Secondary | ICD-10-CM | POA: Insufficient documentation

## 2019-12-11 DIAGNOSIS — Z96641 Presence of right artificial hip joint: Secondary | ICD-10-CM | POA: Insufficient documentation

## 2019-12-14 ENCOUNTER — Other Ambulatory Visit: Payer: Self-pay

## 2019-12-14 ENCOUNTER — Other Ambulatory Visit (INDEPENDENT_AMBULATORY_CARE_PROVIDER_SITE_OTHER): Payer: PRIVATE HEALTH INSURANCE

## 2019-12-14 ENCOUNTER — Ambulatory Visit (INDEPENDENT_AMBULATORY_CARE_PROVIDER_SITE_OTHER): Payer: PRIVATE HEALTH INSURANCE | Admitting: Endocrinology

## 2019-12-14 VITALS — BP 144/80 | HR 70 | Ht 67.0 in | Wt 181.0 lb

## 2019-12-14 DIAGNOSIS — E038 Other specified hypothyroidism: Secondary | ICD-10-CM | POA: Diagnosis not present

## 2019-12-14 DIAGNOSIS — R7989 Other specified abnormal findings of blood chemistry: Secondary | ICD-10-CM

## 2019-12-14 DIAGNOSIS — E201 Pseudohypoparathyroidism: Secondary | ICD-10-CM

## 2019-12-14 LAB — BASIC METABOLIC PANEL
BUN: 11 mg/dL (ref 6–23)
CO2: 24 mEq/L (ref 19–32)
Calcium: 9.4 mg/dL (ref 8.4–10.5)
Chloride: 104 mEq/L (ref 96–112)
Creatinine, Ser: 0.88 mg/dL (ref 0.40–1.50)
GFR: 104.37 mL/min (ref >60.00)
Glucose, Bld: 90 mg/dL (ref 70–99)
Potassium: 3.9 mEq/L (ref 3.5–5.1)
Sodium: 139 mEq/L (ref 135–145)

## 2019-12-14 LAB — T4, FREE: Free T4: 0.72 ng/dL (ref 0.60–1.60)

## 2019-12-14 LAB — TSH: TSH: 4.25 u[IU]/mL (ref 0.35–4.50)

## 2019-12-14 LAB — TESTOSTERONE: Testosterone: 239.04 ng/dL — ABNORMAL LOW (ref 300.00–890.00)

## 2019-12-14 NOTE — Progress Notes (Signed)
Please call to let patient know that the lab results except testosterone normal.  To restart testosterone

## 2019-12-14 NOTE — Progress Notes (Signed)
Patient ID: Kevin Ramos, male   DOB: 29-Mar-1995, 24 y.o.   MRN: [id number]             Chief complaint: Endocrinology follow-up  History of Present Illness:  Problem 1: HYPOCALCEMIA:  RECENT history:  Since 4/21 he has been on 0.5 mcg of calcitriol Also takes 1 tablets of 1200 mg calcium twice a day In 2021 he has required lower doses of calcitriol and last calcium was slightly high  Does not complain of tingling in the hands or face cramping or stiffness and no weakness.    Also previously had issues with hypokalemia which had resolved  No history of hypomagnesemia  He has recently not been regular with his medication routine lately Labs are pending from today  He has also been advised vitamin D supplementation for low baseline level of 21   Lab Results  Component Value Date   CALCIUM 9.9 08/19/2019   CALCIUM 10.6 (H) 06/30/2019   CALCIUM 9.9 04/30/2019    VITAMIN D deficiency: He is taking vitamin D3, 2000 units daily  Lab Results  Component Value Date   VD25OH 20.83 (L) 03/25/2019   VD25OH 30.7 10/29/2017   VD25OH 41.22 12/27/2016   HYPOTHYROIDISM:  He had thyroid functions checked because of fatigue in 12/20 Free T4 was low normal at 0.63 as of 2/21 With his TSH is mildly increased he was told to start taking levothyroxine 25 mcg daily  Previously had symptoms of fatigue and sleepiness  With his TSH also being consistently mildly increased he was told to go up to 75 mcg levothyroxine He has been trying to take this before breakfast daily but lately has not been regular because of taking other medications  He has not had much energy but is recovering from his hip surgery  Generally his sleep difficulties are better with doxepin  Lab Results  Component Value Date   TSH 2.65 08/19/2019   TSH 3.55 06/30/2019   TSH 4.87 (H) 04/30/2019   FREET4 0.91 08/19/2019   FREET4 0.87 06/30/2019   FREET4 0.63 04/30/2019   Problem 2:  HYPOGONADISM:  Because of his significant fatigue, lack of energy and motivation testosterone was checked and free testosterone was low on 2 occasions LH normal along with prolactin  Previously was consistently using his 4 mg Androderm In August he was told to stop the Androderm for 2 weeks prior to his surgery and is still not back on it because of his surgeon not allowing it. He was told that this is because of risk of DVT  He feels much more tired and has had more depression  Has had excellent levels previously  Lab Results  Component Value Date   TESTOSTERONE 445.12 08/19/2019   TESTOSTERONE 577.3 06/30/2019   TESTOSTERONE 253 (L) 05/05/2019   TESTOSTERONE 284 04/30/2019   Lab Results  Component Value Date   TESTOFREE 6.7 (L) 05/05/2019   TESTOFREE 7.5 (L) 04/30/2019      Past Medical History:  Diagnosis Date  . Acne   . Depression   . Dysplastic nevus 02/09/2019   right upper back    No past surgical history on file.  Family History  Problem Relation Age of Onset  . Hypoparathyroidism Mother     Social History:  reports that he has never smoked. He has never used smokeless tobacco. He reports that he does not use drugs. No history on file for alcohol use.   Allergies: No Known Allergies  Allergies as of  12/14/2019   No Known Allergies     Medication List       Accurate as of December 14, 2019 10:08 AM. If you have any questions, ask your nurse or doctor.        acetaminophen 500 MG tablet Commonly known as: TYLENOL Take 500 mg by mouth every 6 (six) hours as needed.   ALPRAZolam 0.5 MG tablet Commonly known as: XANAX Take 0.5 mg by mouth at bedtime as needed for anxiety.   amoxicillin-clavulanate 875-125 MG tablet Commonly known as: AUGMENTIN Take 1 tablet by mouth 2 (two) times daily.   Androderm 4 MG/24HR Pt24 patch Generic drug: testosterone APPLY ONE PATCH TO THE SKIN DAILY   bifidobacterium infantis capsule Take 1 capsule by mouth  daily.   calcitRIOL 0.5 MCG capsule Commonly known as: ROCALTROL TAKE TWO CAPSULES BY MOUTH DAILY   CALCIUM 1200 PO Take 2 capsules by mouth daily.   citalopram 40 MG tablet Commonly known as: CELEXA Take 1 tablet (40 mg total) by mouth daily.   Descovy 200-25 MG tablet Generic drug: emtricitabine-tenofovir AF Take 1 tablet by mouth daily.   doxepin 10 MG capsule Commonly known as: SINEQUAN Take 1 to 2 capsules at bedtime as needed for sleep   fluticasone 50 MCG/ACT nasal spray Commonly known as: FLONASE Place into the nose.   ibuprofen 200 MG tablet Commonly known as: ADVIL Take 200 mg by mouth every 6 (six) hours as needed.   ISOtretinoin 40 MG capsule Commonly known as: ACCUTANE Take 80 mg by mouth daily.   ISOtretinoin 40 MG capsule Commonly known as: ACCUTANE Take 2 tabs po QD   Accutane 40 MG capsule Generic drug: ISOtretinoin Take 1 capsule (40 mg total) by mouth 2 (two) times daily.   levothyroxine 75 MCG tablet Commonly known as: SYNTHROID TAKE ONE TABLET BY MOUTH DAILY BEFORE BREAKFAST   meloxicam 15 MG tablet Commonly known as: MOBIC Take 1 tablet (15 mg total) by mouth daily.   meloxicam 7.5 MG tablet Commonly known as: MOBIC Take 7.5 mg by mouth daily.   pimecrolimus 1 % cream Commonly known as: ELIDEL Apply topically.   tacrolimus 0.1 % ointment Commonly known as: PROTOPIC Apply topically 2 (two) times daily.   Vitamin D 50 MCG (2000 UT) tablet Take 2,000 Units by mouth daily.           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

## 2019-12-15 MED ORDER — CALCITRIOL 0.5 MCG PO CAPS
ORAL_CAPSULE | ORAL | 2 refills | Status: DC
Start: 2019-12-15 — End: 2021-02-14

## 2019-12-15 MED ORDER — LEVOTHYROXINE SODIUM 75 MCG PO TABS: ORAL_TABLET | ORAL | 0 refills | Status: DC

## 2020-01-25 ENCOUNTER — Other Ambulatory Visit: Payer: Self-pay | Admitting: Endocrinology

## 2020-02-01 ENCOUNTER — Other Ambulatory Visit: Payer: Self-pay | Admitting: Endocrinology

## 2020-02-02 ENCOUNTER — Encounter: Payer: Self-pay | Admitting: Dermatology

## 2020-02-02 ENCOUNTER — Ambulatory Visit (INDEPENDENT_AMBULATORY_CARE_PROVIDER_SITE_OTHER): Payer: PRIVATE HEALTH INSURANCE | Admitting: Dermatology

## 2020-02-02 ENCOUNTER — Other Ambulatory Visit: Payer: Self-pay

## 2020-02-02 DIAGNOSIS — L700 Acne vulgaris: Secondary | ICD-10-CM | POA: Diagnosis not present

## 2020-02-02 MED ORDER — DOXYCYCLINE MONOHYDRATE 100 MG PO CAPS: 100.0000 mg | ORAL_CAPSULE | Freq: Two times a day (BID) | ORAL | 0 refills | Status: DC

## 2020-02-02 NOTE — Patient Instructions (Signed)
Take Doxycycline 100mg  1 pill 2 times a day with food and drink for 1 week, then as needed for flares

## 2020-02-02 NOTE — Progress Notes (Signed)
   Follow-Up Visit   Subjective  Kevin Ramos is a 24 y.o. male who presents for the following: Acne (face, back, neck 12m f/u s/p Isotretinoin ~40wk course, has had a few bumps on post neck and R neck).  The following portions of the chart were reviewed this encounter and updated as appropriate:  Tobacco  Allergies  Meds  Problems  Med Hx  Surg Hx  Fam Hx     Review of Systems:  No other skin or systemic complaints except as noted in HPI or Assessment and Plan.  Objective  Well appearing patient in no apparent distress; mood and affect are within normal limits.  A focused examination was performed including face, neck, back. Relevant physical exam findings are noted in the Assessment and Plan.  Objective  Head - Anterior (Face): Scarring shoulders and neck, no acitve paps, 1 active pap back, face clear, 1 active pap neck   Assessment & Plan  Acne vulgaris- chronic and recurrent despite long 2nd course of Isotretinoin with ideal total dose over 200 mg/kg Head - Anterior (Face) papulonodule of neck and papule of back; scarring face;neck; back.  9m s/p Isotretinoin 40wk course (2nd course) Total mg of Isotretinoin 17,100 Total mg/kg - 208.54mg /kg  Improved but active areas today.  Start Doxycycline 100mg  1 po bid with food and drink for 1wk, then prn flares  doxycycline (MONODOX) 100 MG capsule - Head - Anterior (Face)  Still could consider 3rd course Isotretinoin if needed.  Return in about 2 months (around 04/03/2020) for 2-52m acne.   I, Othelia Pulling, RMA, am acting as scribe for Sarina Ser, MD .  Documentation: I have reviewed the above documentation for accuracy and completeness, and I agree with the above.  Sarina Ser, MD

## 2020-02-16 ENCOUNTER — Encounter: Payer: Self-pay | Admitting: Endocrinology

## 2020-02-16 ENCOUNTER — Ambulatory Visit (INDEPENDENT_AMBULATORY_CARE_PROVIDER_SITE_OTHER): Payer: PRIVATE HEALTH INSURANCE | Admitting: Endocrinology

## 2020-02-16 ENCOUNTER — Other Ambulatory Visit: Payer: Self-pay

## 2020-02-16 VITALS — BP 118/84 | HR 90 | Ht 67.0 in | Wt 182.0 lb

## 2020-02-16 DIAGNOSIS — E201 Pseudohypoparathyroidism: Secondary | ICD-10-CM | POA: Diagnosis not present

## 2020-02-16 DIAGNOSIS — R7989 Other specified abnormal findings of blood chemistry: Secondary | ICD-10-CM

## 2020-02-16 DIAGNOSIS — E038 Other specified hypothyroidism: Secondary | ICD-10-CM | POA: Diagnosis not present

## 2020-02-16 LAB — RENAL FUNCTION PANEL
Albumin: 4.5 g/dL (ref 3.5–5.2)
BUN: 8 mg/dL (ref 6–23)
CO2: 28 mEq/L (ref 19–32)
Calcium: 9.3 mg/dL (ref 8.4–10.5)
Chloride: 99 mEq/L (ref 96–112)
Creatinine, Ser: 0.89 mg/dL (ref 0.40–1.50)
GFR: 118.24 mL/min (ref >60.00)
Glucose, Bld: 95 mg/dL (ref 70–99)
Phosphorus: 3.6 mg/dL (ref 2.3–4.6)
Potassium: 3.4 mEq/L — ABNORMAL LOW (ref 3.5–5.1)
Sodium: 137 mEq/L (ref 135–145)

## 2020-02-16 LAB — T4, FREE: Free T4: 0.77 ng/dL (ref 0.60–1.60)

## 2020-02-16 LAB — TESTOSTERONE: Testosterone: 196.61 ng/dL — ABNORMAL LOW (ref 300.00–890.00)

## 2020-02-16 NOTE — Progress Notes (Signed)
Patient ID: Kevin Ramos, male   DOB: [birth year], 24 y.o.   MRN: [id number]             Chief complaint: Endocrinology follow-up  History of Present Illness:  Problem 1: HYPOCALCEMIA  RECENT history:  Since 4/21 he has been on 0.5 mcg of calcitriol, previously had required larger doses Also takes 1 tablets of 1200 mg calcium twice a day and has been regular with days  No recent complaints of tingling in the hands or face cramping or stiffness associated with low calcium levels    No recent problems with low potassium or with hypomagnesemia  Labs are pending from today  He has also been advised vitamin D supplementation for low baseline level of 21   Lab Results  Component Value Date   CALCIUM 9.4 12/14/2019   CALCIUM 9.9 08/19/2019   CALCIUM 10.6 (H) 06/30/2019    VITAMIN D deficiency: He is taking vitamin D3, 2000 units daily  Lab Results  Component Value Date   VD25OH 20.83 (L) 03/25/2019   VD25OH 30.7 10/29/2017   VD25OH 41.22 12/27/2016   HYPOTHYROIDISM:  He had thyroid functions checked because of fatigue in 12/20 Free T4 was low normal at 0.63 as of 2/21 With his TSH is mildly increased he was told to start taking levothyroxine 25 mcg daily  Previously had symptoms of fatigue and sleepiness  With his TSH also being consistently mildly increased he was told to go up to 75 mcg levothyroxine He has been trying to take this before breakfast daily  Has been more regular recently, previously after his hip surgery he had not been taking it consistently  He does not complain of fatigue or sleepiness but more symptoms of depression now  Lab Results  Component Value Date   TSH 4.25 12/14/2019   TSH 2.65 08/19/2019   TSH 3.55 06/30/2019   FREET4 0.72 12/14/2019   FREET4 0.91 08/19/2019   FREET4 0.87 06/30/2019   Problem 2: HYPOGONADISM:  Because of his significant fatigue, lack of energy and motivation testosterone was checked and free testosterone  was low on 2 occasions LH normal along with prolactin  Previously was consistently using his 4 mg Androderm He now says that the patch is causing some skin irritation and rough texture at the point of applying this without any itching or obvious redness He has not applied the patch for 2 weeks because of the skin reaction  Does not complain of any difference in his energy level with and without the patches, recently having other issues with depression  Has had excellent levels previously when taking the patch regularly  Lab Results  Component Value Date   TESTOSTERONE 239.04 (L) 12/14/2019   TESTOSTERONE 445.12 08/19/2019   TESTOSTERONE 577.3 06/30/2019   TESTOSTERONE 253 (L) 05/05/2019   TESTOSTERONE 284 04/30/2019   Lab Results  Component Value Date   TESTOFREE 6.7 (L) 05/05/2019   TESTOFREE 7.5 (L) 04/30/2019      Past Medical History:  Diagnosis Date  . Acne   . Depression   . Dysplastic nevus 02/09/2019   right upper back, moderate atypia, close to margin.    No past surgical history on file.  Family History  Problem Relation Age of Onset  . Hypoparathyroidism Mother     Social History:  reports that he has never smoked. He has never used smokeless tobacco. He reports that he does not use drugs. No history on file for alcohol use.   Allergies: No  Known Allergies  Allergies as of 02/16/2020   No Known Allergies     Medication List       Accurate as of February 16, 2020 10:14 AM. If you have any questions, ask your nurse or doctor.        ISOtretinoin 40 MG capsule Commonly known as: ACCUTANE Take 80 mg by mouth daily.   Accutane 40 MG capsule Generic drug: ISOtretinoin Take 1 capsule (40 mg total) by mouth 2 (two) times daily.   acetaminophen 500 MG tablet Commonly known as: TYLENOL Take 500 mg by mouth every 6 (six) hours as needed.   ALPRAZolam 0.5 MG tablet Commonly known as: XANAX Take 0.5 mg by mouth at bedtime as needed for anxiety.    amoxicillin-clavulanate 875-125 MG tablet Commonly known as: AUGMENTIN Take 1 tablet by mouth 2 (two) times daily.   Androderm 4 MG/24HR Pt24 patch Generic drug: testosterone APPLY ONE PATCH TO THE SKIN DAILY   bifidobacterium infantis capsule Take 1 capsule by mouth daily.   calcitRIOL 0.5 MCG capsule Commonly known as: ROCALTROL TAKE TWO CAPSULES BY MOUTH DAILY   CALCIUM 1200 PO Take 2 capsules by mouth daily.   citalopram 40 MG tablet Commonly known as: CELEXA Take 1 tablet (40 mg total) by mouth daily.   Descovy 200-25 MG tablet Generic drug: emtricitabine-tenofovir AF Take 1 tablet by mouth daily.   doxepin 10 MG capsule Commonly known as: SINEQUAN Take 1 to 2 capsules at bedtime as needed for sleep   doxycycline 100 MG capsule Commonly known as: MONODOX Take 1 capsule (100 mg total) by mouth 2 (two) times daily. Take with food and drink   fluticasone 50 MCG/ACT nasal spray Commonly known as: FLONASE Place into the nose.   ibuprofen 200 MG tablet Commonly known as: ADVIL Take 200 mg by mouth every 6 (six) hours as needed.   levothyroxine 75 MCG tablet Commonly known as: SYNTHROID TAKE ONE TABLET BY MOUTH DAILY BEFORE BREAKFAST   meloxicam 15 MG tablet Commonly known as: MOBIC Take 1 tablet (15 mg total) by mouth daily.   meloxicam 7.5 MG tablet Commonly known as: MOBIC Take 7.5 mg by mouth daily.   pimecrolimus 1 % cream Commonly known as: ELIDEL Apply topically.   tacrolimus 0.1 % ointment Commonly known as: PROTOPIC Apply topically 2 (two) times daily.   Vitamin D 50 MCG (2000 UT) tablet Take 2,000 Units by mouth daily.           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 Previously adequately replaced with 4 mg Androderm He feels that he is getting skin irritation with the patches but does not want to change as yet   PLAN:   Recheck labs today: Renal panel, thyroid levels including free T4 TSH, and testosterone  He can try applying the Androderm patch on his upper chest or shoulder and can also try some hydrocortisone However reassured him that the irritation is short lasting If he has a change in insurance coverage in January he may apply one of the gel preparations and explained to him what options are available Gaynelle Cage is currently not covered  Follow-up to be determined, will decide doses of calcitriol, calcium and levothyroxine based on labs   Elayne Snare 02/16/2020, 10:14 AM

## 2020-02-18 ENCOUNTER — Emergency Department: Payer: PRIVATE HEALTH INSURANCE

## 2020-02-18 ENCOUNTER — Other Ambulatory Visit: Payer: Self-pay

## 2020-02-18 ENCOUNTER — Emergency Department
Admission: EM | Admit: 2020-02-18 | Discharge: 2020-02-19 | Disposition: A | Discharge status: Home / Self Care | Payer: PRIVATE HEALTH INSURANCE | Attending: Emergency Medicine | Admitting: Emergency Medicine

## 2020-02-18 DIAGNOSIS — E039 Hypothyroidism, unspecified: Secondary | ICD-10-CM | POA: Diagnosis not present

## 2020-02-18 DIAGNOSIS — R220 Localized swelling, mass and lump, head: Secondary | ICD-10-CM | POA: Diagnosis present

## 2020-02-18 DIAGNOSIS — E876 Hypokalemia: Secondary | ICD-10-CM

## 2020-02-18 DIAGNOSIS — Z96641 Presence of right artificial hip joint: Secondary | ICD-10-CM | POA: Insufficient documentation

## 2020-02-18 DIAGNOSIS — B20 Human immunodeficiency virus [HIV] disease: Secondary | ICD-10-CM | POA: Diagnosis not present

## 2020-02-18 DIAGNOSIS — B370 Candidal stomatitis: Secondary | ICD-10-CM

## 2020-02-18 LAB — CBC WITH DIFFERENTIAL/PLATELET
Abs Immature Granulocytes: 0.02 10*3/uL (ref 0.00–0.07)
Basophils Absolute: 0.1 10*3/uL (ref 0.0–0.1)
Basophils Relative: 1 %
Eosinophils Absolute: 0.1 10*3/uL (ref 0.0–0.5)
Eosinophils Relative: 1 %
HCT: 48.5 % (ref 39.0–52.0)
Hemoglobin: 16.5 g/dL (ref 13.0–17.0)
Immature Granulocytes: 0 %
Lymphocytes Relative: 29 %
Lymphs Abs: 2.3 10*3/uL (ref 0.7–4.0)
MCH: 28.9 pg (ref 26.0–34.0)
MCHC: 34 g/dL (ref 30.0–36.0)
MCV: 84.9 fL (ref 80.0–100.0)
Monocytes Absolute: 0.6 10*3/uL (ref 0.1–1.0)
Monocytes Relative: 7 %
Neutro Abs: 5 10*3/uL (ref 1.7–7.7)
Neutrophils Relative %: 62 %
Platelets: 189 10*3/uL (ref 150–400)
RBC: 5.71 MIL/uL (ref 4.22–5.81)
RDW: 12.1 % (ref 11.5–15.5)
WBC: 8.1 10*3/uL (ref 4.0–10.5)
nRBC: 0 % (ref 0.0–0.2)

## 2020-02-18 LAB — BASIC METABOLIC PANEL
Anion gap: 12 (ref 5–15)
BUN: 8 mg/dL (ref 6–20)
CO2: 28 mmol/L (ref 22–32)
Calcium: 8.5 mg/dL — ABNORMAL LOW (ref 8.9–10.3)
Chloride: 99 mmol/L (ref 98–111)
Creatinine, Ser: 0.94 mg/dL (ref 0.61–1.24)
GFR, Estimated: >60 mL/min (ref >60)
Glucose, Bld: 108 mg/dL — ABNORMAL HIGH (ref 70–99)
Potassium: 2.8 mmol/L — ABNORMAL LOW (ref 3.5–5.1)
Sodium: 139 mmol/L (ref 135–145)

## 2020-02-18 MED ORDER — SODIUM CHLORIDE 0.9 % IV BOLUS
500.0000 mL | Freq: Once | INTRAVENOUS | Status: AC
  Administered 2020-02-18: 500 mL via INTRAVENOUS

## 2020-02-18 MED ORDER — IOHEXOL 300 MG/ML  SOLN
75.0000 mL | Freq: Once | INTRAMUSCULAR | Status: AC | PRN
  Administered 2020-02-18: 75 mL via INTRAVENOUS

## 2020-02-18 MED ORDER — CLOTRIMAZOLE 10 MG MT TROC: 10.0000 mg | Freq: Every day | OROMUCOSAL | 0 refills | Status: DC

## 2020-02-18 NOTE — ED Triage Notes (Signed)
PT to ED via POV c/o tongue swelling on the back right side since Monday but much worse today. HX of ulcers but pt states this is not the same. PT managing secretions  DISREGARD PREVIOUS NOTE, MADE IN ERROR UNDER NT LOGIN

## 2020-02-18 NOTE — ED Provider Notes (Signed)
[facility] Emergency Department Provider Note   ____________________________________________   First MD Initiated Contact with Patient 02/18/20 2227     (approximate)  I have reviewed the triage vital signs and the nursing notes.   HISTORY  Chief Complaint Oral Swelling    HPI Zeph Riebel is a 24 y.o. male with a history of depression, hip avascular necrosis, and depression  Patient reports since Tuesday he has had a sensation of pain in the back of the right lower side of his mouth feels like tooth.  But he is also noticed the area feels like it started become more swollen at the back of the right jaw.  Reports that the swelling seems to be worsening he started to get some swelling across the front of his face as well.  No trouble breathing.  Able to swallow without difficulty but reports a little bit sore on the right side when he swallows.  No fevers or chills.  Plans to reach out and try to see his dentist on Monday but they are currently closed  No nausea or vomiting.  Denies swelling itching or rash other places.  No new medications.   No known allergen denies itching.  Past Medical History:  Diagnosis Date  . Acne   . Depression   . Dysplastic nevus 02/09/2019   right upper back, moderate atypia, close to margin.    Patient Active Problem List   Diagnosis Date Noted  . Status post hip replacement, right 12/11/2019  . Hypokalemia 11/23/2019  . Hypertrophy of nasal turbinates 10/12/2019  . Deviated nasal septum 10/12/2019  . Osteoarthritis of right hip 10/10/2019  . Legg-Calve-Perthes disease, right 09/22/2019  . Non-seasonal allergic rhinitis 09/21/2019  . Sacroiliitis ([place]) 07/23/2019  . Acute hip pain, bilateral 07/23/2019  . MDD (major depressive disorder), recurrent, in full remission ([place]) 07/22/2019  . Dyspnea on exertion 07/02/2019  . Low testosterone in male 06/30/2019  . Vitamin D deficiency 06/16/2019  . Acquired  hypothyroidism 06/16/2019  . Anxiety 05/08/2018  . Noncompliance 10/30/2017  . Major depressive disorder, recurrent severe without psychotic features ([place]) 10/29/2017  . Suicidal ideation 10/29/2017  . 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 Acne 08/10/2011  . Anxiety and depression 08/10/2011  . Osteopenia 08/10/2011    History reviewed. No pertinent surgical history.  Prior to Admission medications   Medication Sig Start Date End Date Taking? Authorizing Provider  ACCUTANE 40 MG capsule Take 1 capsule (40 mg total) by mouth 2 (two) times daily. Patient not taking: Reported on 12/14/2019 10/15/19   Ralene Bathe, MD  acetaminophen (TYLENOL) 500 MG tablet Take 500 mg by mouth every 6 (six) hours as needed.    [provider]  ALPRAZolam Duanne Moron) 0.5 MG tablet Take 0.5 mg by mouth at bedtime as needed for anxiety.    [provider]  amoxicillin-clavulanate (AUGMENTIN) 875-125 MG tablet Take 1 tablet by mouth 2 (two) times daily. Patient not taking: Reported on 12/14/2019 10/20/19   [provider]  ANDRODERM 4 MG/24HR PT24 patch APPLY ONE PATCH TO THE SKIN DAILY 02/01/20   Elayne Snare, MD  bifidobacterium infantis (ALIGN) capsule Take 1 capsule by mouth daily. Patient not taking: Reported on 02/16/2020    [provider]  calcitRIOL (ROCALTROL) 0.5 MCG capsule TAKE TWO CAPSULES BY MOUTH DAILY 12/15/19   Elayne Snare, MD  Calcium Carbonate-Vit D-Min (CALCIUM 1200 PO) Take 2 capsules by mouth daily.     [provider]  Cholecalciferol (VITAMIN D) 50 MCG (2000 UT) tablet Take 2,000 Units by mouth daily.    [provider]  citalopram (CELEXA) 40 MG tablet Take 1 tablet (40 mg total) by mouth daily. 11/17/19   [place] Crane, MD  clotrimazole (MYCELEX) 10 MG troche Take 1 tablet (10 mg total) by mouth 5 (five) times daily. 02/18/20   Delman Kitten, MD  DESCOVY 200-25 MG tablet Take 1 tablet by mouth daily. Patient  not taking: Reported on 12/14/2019 09/09/18   [provider]  doxepin (SINEQUAN) 10 MG capsule Take 1 to 2 capsules at bedtime as needed for sleep 11/17/19   [place] Crane, MD  doxycycline (MONODOX) 100 MG capsule Take 1 capsule (100 mg total) by mouth 2 (two) times daily. Take with food and drink 02/02/20   Ralene Bathe, MD  fluticasone [facility]) 50 MCG/ACT nasal spray Place into the nose.    [provider]  ibuprofen (ADVIL) 200 MG tablet Take 200 mg by mouth every 6 (six) hours as needed.    [provider]  ISOtretinoin (ACCUTANE) 40 MG capsule Take 80 mg by mouth daily. Patient not taking: Reported on 12/14/2019    [provider]  levothyroxine (SYNTHROID) 75 MCG tablet TAKE ONE TABLET BY MOUTH DAILY BEFORE BREAKFAST 12/15/19   Elayne Snare, MD  meloxicam (MOBIC) 15 MG tablet Take 1 tablet (15 mg total) by mouth daily. Patient not taking: Reported on 12/14/2019 02/25/19   Felipa Furnace, DPM  meloxicam (MOBIC) 7.5 MG tablet Take 7.5 mg by mouth daily. Patient not taking: Reported on 02/16/2020 09/02/19   [provider]  pimecrolimus (ELIDEL) 1 % cream Apply topically. 04/24/19   [provider]  tacrolimus (PROTOPIC) 0.1 % ointment Apply topically 2 (two) times daily. 08/20/19   Ralene Bathe, MD  Patient reports no longer using tacrolimus or Elidel  Reports he just had his thyroid and parathyroid follow-up and testing a week ago and things looked good except for slightly low calcium  Allergies Patient has no known allergies.  Family History  Problem Relation Age of Onset  . Hypoparathyroidism Mother     Social History Social History   Tobacco Use  . Smoking status: Never Smoker  . Smokeless tobacco: Never Used  Substance Use Topics  . Alcohol use: Never  . Drug use: No    Review of Systems Constitutional: No fever/chills Eyes: No visual changes. ENT: See HPI. Cardiovascular: Denies chest pain. Respiratory: Denies  shortness of breath. Gastrointestinal: No abdominal pain.   Musculoskeletal: Negative for back pain. Skin: Negative for rash. Neurological: Negative for headaches, areas of focal weakness or numbness.    ____________________________________________   PHYSICAL EXAM:  VITAL SIGNS: ED Triage Vitals  Enc Vitals Group     BP 02/18/20 2207 131/87     Pulse Rate 02/18/20 2207 (!) 102     Resp 02/18/20 2207 18     Temp 02/18/20 2207 99.4 F (37.4 C)     Temp src --      SpO2 02/18/20 2207 99 %     Weight 02/18/20 2211 182 lb (82.6 kg)     Height 02/18/20 2211 5\' 7"  (1.702 m)     Head Circumference --      Peak Flow --      Pain Score 02/18/20 2211 7     Pain Loc --      Pain Edu? --      Excl. in [place]? --  Constitutional: Alert and oriented. Well appearing and in no acute distress. Eyes: Conjunctivae are normal. Head: Atraumatic. Nose: No congestion/rhinnorhea. Mouth/Throat: Mucous membranes are moist.  He has tenderness over the posterior right inferior molars, and he has some mild swelling of the buccal mucosa.  Also he appears to have some mild swelling of the right face and jawline.  There is no obvious warmth.  There is no noted drainage or fistula.  He does have poor dentition throughout.  His posterior oropharynx is widely patent.  He has no difficulty with speech and his speech is clear without muffling. Neck: No stridor.  Cardiovascular: Normal rate, regular rhythm. Grossly normal heart sounds.  Good peripheral circulation. Respiratory: Normal respiratory effort.  No retractions. Lungs CTAB. Gastrointestinal: Soft and nontender. No distention. Neurologic:  Normal speech and language. No gross focal neurologic deficits are appreciated.  Skin:  Skin is warm, dry and intact. No rash noted except for mild edema over the right buccal mucosa and right lateral mandibular region. Psychiatric: Mood and affect are normal. Speech and behavior are  normal.  ____________________________________________   LABS (all labs ordered are listed, but only abnormal results are displayed)  Labs Reviewed  BASIC METABOLIC PANEL - Abnormal; Notable for the following components:      Result Value   Potassium 2.8 (*)    Glucose, Bld 108 (*)    Calcium 8.5 (*)    All other components within normal limits  CBC WITH DIFFERENTIAL/PLATELET  HIV ANTIBODY (ROUTINE TESTING W REFLEX)   ____________________________________________  EKG   ____________________________________________  RADIOLOGY  CT max face and neck pending at time of signout, will be followed up by my partner ____________________________________________   PROCEDURES  Procedure(s) performed: None  Procedures  Critical Care performed: No  ____________________________________________   INITIAL IMPRESSION / ASSESSMENT AND PLAN / ED COURSE  Pertinent labs & imaging results that were available during my care of the patient were reviewed by me and considered in my medical decision making (see chart for details).   Patient presents for evaluation of facial swelling, seems primarily located around the right mandibular region and he reports dental pain in this area as well with chewing over the last several days.  The swelling seem to be slowly increasing.  I suspect he may have a dental abscess, he does not have evidence on my clinical exam of impending airway involvement or obvious Ludewig's.  Also on the differential would be parotitis, or angioedema though this seems unlikely especially given stability of medications and no obvious offending agent.  He does not have evidence of anaphylaxis or systemic rash to suggest an obvious allergen.    ----------------------------------------- 11:27 PM on 02/18/2020 -----------------------------------------   At the time of note completion, plan signed out to my partner to follow-up on laboratory testing and CT imaging to evaluate for  further etiology of right mandibular and right facial swelling.  No suspicion for possible mandibular or dental abscess.  Follow-up on imaging, do not see clear evidence of allergic reaction or angioedema by exam.  Will need reassessment and further follow-up on labs and imaging studies prior to disposition decision.  Additionally has evidence of oral thrush I have ordered clotrimazole ____________________________________________   FINAL CLINICAL IMPRESSION(S) / ED DIAGNOSES  Final diagnoses:  Thrush, oral  Swelling of right side of face        Note:  This document was prepared using Dragon voice recognition software and may include unintentional dictation errors  Delman Kitten, MD 02/18/20 [phone]

## 2020-02-19 LAB — HIV ANTIBODY (ROUTINE TESTING W REFLEX): HIV Screen 4th Generation wRfx: NONREACTIVE

## 2020-02-19 MED ORDER — POTASSIUM CHLORIDE CRYS ER 20 MEQ PO TBCR
80.0000 meq | EXTENDED_RELEASE_TABLET | Freq: Once | ORAL | Status: AC
  Administered 2020-02-19: 80 meq via ORAL
  Filled 2020-02-19: qty 4

## 2020-02-19 NOTE — ED Provider Notes (Addendum)
Accepted care of this patient from Dr. Jacqualine Code at 11 PM pending CT of the neck for right facial swelling.  CT is unremarkable.  I reevaluated patient who tells me that his pain is mostly on the right side of his tongue.  Does not feel that there is any swelling or facial pain.  He does have thrush which is most likely the cause of his symptoms.  He was given a prescription for clotrimazole by Dr. Jacqualine Code who I recommended taking 5 times daily for the next 5 to 7 days and follow-up with his primary care doctor.  Review of his labs also show mild hypokalemia.  Therefore an EKG was done showing normal intervals.  Patient was given 80 mEq of p.o. potassium.  He does report having problems with low potassium in the past and does have K pills at home.  Discussed close follow-up with PCP for recheck of his lab work. Discussed my standard return precautions. HIV testing pending.    ED ECG REPORT I, Rudene Re, the attending physician, personally viewed and interpreted this ECG.  Normal sinus rhythm, rate of 89, normal intervals, QTC of 427, no ST elevations or depressions.  Normal EKG.   I have personally reviewed the images performed during this visit and I agree with the Radiologist's read.   Interpretation by Radiologist:  CT Soft Tissue Neck W Contrast  Result Date: 02/19/2020 CLINICAL DATA:  Initial evaluation for foreign body suspected, tongue swelling. EXAM: CT NECK WITH CONTRAST TECHNIQUE: Multidetector CT imaging of the neck was performed using the standard protocol following the bolus administration of intravenous contrast. CONTRAST:  56mL OMNIPAQUE IOHEXOL 300 MG/ML  SOLN COMPARISON:  None available. FINDINGS: Pharynx and larynx: Oral cavity within normal limits without discrete mass or collection. No acute abnormality about the dentition. No appreciable swelling or edematous changes seen about the oral tongue. No discrete tongue base mass identified. Palatine tonsils symmetric and within  normal limits. No tonsillar or peritonsillar abscess. Parapharyngeal fat maintained. Remainder of the oropharynx and nasopharynx within normal limits. No retropharyngeal swelling or collection. Epiglottis normal. Vallecula largely effaced by the lingual tonsils. Remainder of the hypopharynx and supraglottic larynx within normal limits. Glottis normal. Subglottic airway patent and clear. No radiopaque foreign body identified. Salivary glands: Parotid glands within normal limits. Submandibular glands not well seen, which may be hypoplastic and/or absent. Thyroid: Normal. Lymph nodes: No enlarged or pathologic adenopathy within the neck. Vascular: Normal intravascular enhancement seen throughout the neck. Limited intracranial: Unremarkable. Visualized orbits: Unremarkable. Mastoids and visualized paranasal sinuses: Tiny retention cyst noted within the left sphenoid sinus. Visualized paranasal sinuses are otherwise clear. Visualized mastoids and middle ear cavities are well pneumatized and free of fluid. Skeleton: No acute osseous finding. No discrete or worrisome osseous lesions. Upper chest: Visualized upper chest demonstrates no acute finding. Partially visualized lungs are clear. Other: None. IMPRESSION: Negative CT of the neck. No radiopaque foreign body identified. No acute inflammatory changes or other abnormality identified. Electronically Signed   By: Jeannine Boga M.D.   On: 02/19/2020 00:14      Rudene Re, MD 02/19/20 [place], [place], MD 02/19/20 [phone]

## 2020-02-23 ENCOUNTER — Other Ambulatory Visit: Payer: Self-pay

## 2020-02-23 ENCOUNTER — Encounter: Payer: Self-pay | Admitting: Podiatry

## 2020-02-23 ENCOUNTER — Ambulatory Visit (INDEPENDENT_AMBULATORY_CARE_PROVIDER_SITE_OTHER): Payer: PRIVATE HEALTH INSURANCE | Admitting: Podiatry

## 2020-02-23 DIAGNOSIS — L603 Nail dystrophy: Secondary | ICD-10-CM | POA: Diagnosis not present

## 2020-02-23 NOTE — Progress Notes (Signed)
Subjective:  Patient ID: Kevin Ramos, male    DOB: July 23, 1995,  MRN: [id number]  Chief Complaint  Patient presents with  . Ingrown Toenail    24 y.o. male presents with the above complaint.  Patient presents with follow-up of a longitudinal nail dystrophy without any acute pain.  Patient just wants to get it evaluated monitor to make sure that there is no complication from an ingrown nail procedure.  He denies any other acute complaints.  He denies any pain on palpation to the nail.  He does not have any pain with an ingrown as well.   Review of Systems: Negative except as noted in the HPI. Denies N/V/F/Ch.  Past Medical History:  Diagnosis Date  . Acne   . Depression   . Dysplastic nevus 02/09/2019   right upper back, moderate atypia, close to margin.    Current Outpatient Medications:  .  ACCUTANE 40 MG capsule, Take 1 capsule (40 mg total) by mouth 2 (two) times daily. (Patient not taking: Reported on 12/14/2019), Disp: 60 capsule, Rfl: 0 .  acetaminophen (TYLENOL) 500 MG tablet, Take 500 mg by mouth every 6 (six) hours as needed., Disp: , Rfl:  .  ALPRAZolam (XANAX) 0.5 MG tablet, Take 0.5 mg by mouth at bedtime as needed for anxiety., Disp: , Rfl:  .  amoxicillin-clavulanate (AUGMENTIN) 875-125 MG tablet, Take 1 tablet by mouth 2 (two) times daily. (Patient not taking: Reported on 12/14/2019), Disp: , Rfl:  .  ANDRODERM 4 MG/24HR PT24 patch, APPLY ONE PATCH TO THE SKIN DAILY, Disp: 30 patch, Rfl: 2 .  bifidobacterium infantis (ALIGN) capsule, Take 1 capsule by mouth daily. (Patient not taking: Reported on 02/16/2020), Disp: , Rfl:  .  calcitRIOL (ROCALTROL) 0.5 MCG capsule, TAKE TWO CAPSULES BY MOUTH DAILY, Disp: 180 capsule, Rfl: 2 .  Calcium Carbonate-Vit D-Min (CALCIUM 1200 PO), Take 2 capsules by mouth daily. , Disp: , Rfl:  .  Cholecalciferol (VITAMIN D) 50 MCG (2000 UT) tablet, Take 2,000 Units by mouth daily., Disp: , Rfl:  .  citalopram (CELEXA) 40 MG tablet, Take  1 tablet (40 mg total) by mouth daily., Disp: 90 tablet, Rfl: 1 .  clotrimazole (MYCELEX) 10 MG troche, Take 1 tablet (10 mg total) by mouth 5 (five) times daily., Disp: 35 Troche, Rfl: 0 .  DESCOVY 200-25 MG tablet, Take 1 tablet by mouth daily. (Patient not taking: Reported on 12/14/2019), Disp: , Rfl:  .  doxepin (SINEQUAN) 10 MG capsule, Take 1 to 2 capsules at bedtime as needed for sleep, Disp: 180 capsule, Rfl: 1 .  doxycycline (MONODOX) 100 MG capsule, Take 1 capsule (100 mg total) by mouth 2 (two) times daily. Take with food and drink, Disp: 60 capsule, Rfl: 0 .  fluticasone (FLONASE) 50 MCG/ACT nasal spray, Place into the nose., Disp: , Rfl:  .  ibuprofen (ADVIL) 200 MG tablet, Take 200 mg by mouth every 6 (six) hours as needed., Disp: , Rfl:  .  ISOtretinoin (ACCUTANE) 40 MG capsule, Take 80 mg by mouth daily. (Patient not taking: Reported on 12/14/2019), Disp: , Rfl:  .  levothyroxine (SYNTHROID) 75 MCG tablet, TAKE ONE TABLET BY MOUTH DAILY BEFORE BREAKFAST, Disp: 90 tablet, Rfl: 0 .  meloxicam (MOBIC) 15 MG tablet, Take 1 tablet (15 mg total) by mouth daily. (Patient not taking: Reported on 12/14/2019), Disp: 30 tablet, Rfl: 3 .  meloxicam (MOBIC) 7.5 MG tablet, Take 7.5 mg by mouth daily. (Patient not taking: Reported on 02/16/2020), Disp: ,  Rfl:  .  pimecrolimus (ELIDEL) 1 % cream, Apply topically., Disp: , Rfl:  .  tacrolimus (PROTOPIC) 0.1 % ointment, Apply topically 2 (two) times daily., Disp: 100 g, Rfl: 3  Social History   Tobacco Use  Smoking Status Never Smoker  Smokeless Tobacco Never Used    No Known Allergies Objective:  There were no vitals filed for this visit. There is no height or weight on file to calculate BMI. Constitutional Well developed. Well nourished.  Vascular Dorsalis pedis pulses palpable bilaterally. Posterior tibial pulses palpable bilaterally. Capillary refill normal to all digits.  No cyanosis or clubbing noted. Pedal hair growth normal.   Neurologic Normal speech. Oriented to person, place, and time. Epicritic sensation to light touch grossly present bilaterally.  Dermatologic  longitudinal step-off noted in the nail plate extending from medial to lateral side.  No previous history of ingrown noted.  Skin has healed really well.  No clinical signs of infection.  No pain on palpation to the nail plate  Orthopedic: Normal joint ROM without pain or crepitus bilaterally. No visible deformities. No bony tenderness.   Radiographs: None Assessment:   1. Onychodystrophy    Plan:  Patient was evaluated and treated and all questions answered.  Right hallux nail dystrophy -I explained patient the etiology of nail dystrophy and various treatment options were discussed.  I discussed with him that sometimes with an ingrown nail procedure there could be damage to the nail matrix leading to longitudinal step-off in the nail.  I encouraged him that this should improve with time.  As the nail continues to grow and be debrided down it should disappear with time.  If there is no resolve then we can discuss other treatment options in the future.  He states understanding.  No follow-ups on file.

## 2020-03-09 ENCOUNTER — Telehealth (INDEPENDENT_AMBULATORY_CARE_PROVIDER_SITE_OTHER): Payer: PRIVATE HEALTH INSURANCE | Admitting: Psychiatry

## 2020-03-09 ENCOUNTER — Encounter (HOSPITAL_COMMUNITY): Payer: Self-pay | Admitting: Psychiatry

## 2020-03-09 ENCOUNTER — Other Ambulatory Visit: Payer: Self-pay

## 2020-03-09 DIAGNOSIS — F3342 Major depressive disorder, recurrent, in full remission: Secondary | ICD-10-CM | POA: Diagnosis not present

## 2020-03-09 MED ORDER — DOXEPIN HCL 10 MG PO CAPS: ORAL_CAPSULE | ORAL | 1 refills | Status: DC

## 2020-03-09 MED ORDER — CITALOPRAM HYDROBROMIDE 40 MG PO TABS: 40.0000 mg | ORAL_TABLET | Freq: Every day | ORAL | 1 refills | Status: DC

## 2020-03-09 NOTE — Progress Notes (Signed)
[facility] MD/PA/NP OP Progress Note  Virtual Visit via Video Note  I connected with Kevin Ramos on 03/09/20 at  3:20 PM EST by a video enabled telemedicine application and verified that I am speaking with the correct person using two identifiers.  Location: Patient: Home Provider: Clinic   I discussed the limitations of evaluation and management by telemedicine and the availability of in person appointments. The patient expressed understanding and agreed to proceed.  I provided 14 minutes of non-face-to-face time during this encounter.    03/09/2020 3:23 PM Kevin Ramos  MRN:  [id number]  Chief Complaint: " I am 85% recovered from my surgery."  HPI: Patient found that he has almost 85% recovered from his surgery.  He underwent a major hip surgery in [facility] a few months ago.  He stated that he has some pain when he bends forward and also notices that he has more pain during the cold days.  However for the most part is recovering well.  He has been going for his regular follow-up. He has noticed that he has been a little bit more emotional than usual after his surgery however he is working regarding this with his therapist. He informed that he is planning to return back to [place] sometime in February next year.  He wants to touch base with writer before he leaves.  He denied any special plans for the Christmas holiday other then a family get-together.   Visit Diagnosis:  No diagnosis found.  Past Psychiatric History: MDD  Past Medical History:  Past Medical History:  Diagnosis Date  . Acne   . Depression   . Dysplastic nevus 02/09/2019   right upper back, moderate atypia, close to margin.   No past surgical history on file.  Family Psychiatric History: denied  Family History:  Family History  Problem Relation Age of Onset  . Hypoparathyroidism Mother     Social History:  Social History   Socioeconomic History  . Marital status: Single    Spouse name:  Not on file  . Number of children: Not on file  . Years of education: Not on file  . Highest education level: Not on file  Occupational History  . Not on file  Tobacco Use  . Smoking status: Never Smoker  . Smokeless tobacco: Never Used  Substance and Sexual Activity  . Alcohol use: Never  . Drug use: No  . Sexual activity: Not on file  Other Topics Concern  . Not on file  Social History Narrative  . Not on file   Social Determinants of Health   Financial Resource Strain: Not on file  Food Insecurity: Not on file  Transportation Needs: Not on file  Physical Activity: Not on file  Stress: Not on file  Social Connections: Not on file    Allergies: No Known Allergies  Metabolic Disorder Labs: Lab Results  Component Value Date   HGBA1C 4.9 10/29/2017   MPG 93.93 10/29/2017   Lab Results  Component Value Date   PROLACTIN 14.3 06/30/2019   PROLACTIN 17.9 (H) 05/05/2019   Lab Results  Component Value Date   CHOL 162 10/29/2017   TRIG 192 (H) 10/29/2017   HDL 35 (L) 10/29/2017   CHOLHDL 4.6 10/29/2017   VLDL 38 10/29/2017   LDLCALC 89 10/29/2017   Lab Results  Component Value Date   TSH 4.25 12/14/2019   TSH 2.65 08/19/2019    Therapeutic Level Labs: No results found for: LITHIUM No results found  for: VALPROATE No components found for:  CBMZ  Current Medications: Current Outpatient Medications  Medication Sig Dispense Refill  . ACCUTANE 40 MG capsule Take 1 capsule (40 mg total) by mouth 2 (two) times daily. (Patient not taking: Reported on 12/14/2019) 60 capsule 0  . acetaminophen (TYLENOL) 500 MG tablet Take 500 mg by mouth every 6 (six) hours as needed.    . ALPRAZolam (XANAX) 0.5 MG tablet Take 0.5 mg by mouth at bedtime as needed for anxiety.    Marland Kitchen amoxicillin-clavulanate (AUGMENTIN) 875-125 MG tablet Take 1 tablet by mouth 2 (two) times daily. (Patient not taking: Reported on 12/14/2019)    . ANDRODERM 4 MG/24HR PT24 patch APPLY ONE PATCH TO THE SKIN  DAILY 30 patch 2  . bifidobacterium infantis (ALIGN) capsule Take 1 capsule by mouth daily. (Patient not taking: Reported on 02/16/2020)    . calcitRIOL (ROCALTROL) 0.5 MCG capsule TAKE TWO CAPSULES BY MOUTH DAILY 180 capsule 2  . Calcium Carbonate-Vit D-Min (CALCIUM 1200 PO) Take 2 capsules by mouth daily.     . Cholecalciferol (VITAMIN D) 50 MCG (2000 UT) tablet Take 2,000 Units by mouth daily.    . citalopram (CELEXA) 40 MG tablet Take 1 tablet (40 mg total) by mouth daily. 90 tablet 1  . clotrimazole (MYCELEX) 10 MG troche Take 1 tablet (10 mg total) by mouth 5 (five) times daily. 35 Troche 0  . DESCOVY 200-25 MG tablet Take 1 tablet by mouth daily. (Patient not taking: Reported on 12/14/2019)    . doxepin (SINEQUAN) 10 MG capsule Take 1 to 2 capsules at bedtime as needed for sleep 180 capsule 1  . doxycycline (MONODOX) 100 MG capsule Take 1 capsule (100 mg total) by mouth 2 (two) times daily. Take with food and drink 60 capsule 0  . fluticasone (FLONASE) 50 MCG/ACT nasal spray Place into the nose.    . ibuprofen (ADVIL) 200 MG tablet Take 200 mg by mouth every 6 (six) hours as needed.    . ISOtretinoin (ACCUTANE) 40 MG capsule Take 80 mg by mouth daily. (Patient not taking: Reported on 12/14/2019)    . levothyroxine (SYNTHROID) 75 MCG tablet TAKE ONE TABLET BY MOUTH DAILY BEFORE BREAKFAST 90 tablet 0  . meloxicam (MOBIC) 15 MG tablet Take 1 tablet (15 mg total) by mouth daily. (Patient not taking: Reported on 12/14/2019) 30 tablet 3  . meloxicam (MOBIC) 7.5 MG tablet Take 7.5 mg by mouth daily. (Patient not taking: Reported on 02/16/2020)    . pimecrolimus (ELIDEL) 1 % cream Apply topically.    . tacrolimus (PROTOPIC) 0.1 % ointment Apply topically 2 (two) times daily. 100 g 3   No current facility-administered medications for this visit.     Psychiatric Specialty Exam: Review of Systems  There were no vitals taken for this visit.There is no height or weight on file to calculate BMI.   General Appearance: Well Groomed  Eye Contact:  Good  Speech:  Clear and Coherent and Normal Rate  Volume:  Normal  Mood:  Euthymic  Affect:  Congruent  Thought Process:  Goal Directed, Linear and Descriptions of Associations: Intact  Orientation:  Full (Time, Place, and Person)  Thought Content: Logical   Suicidal Thoughts:  No  Homicidal Thoughts:  No  Memory:  Recent;   Good Remote;   Good  Judgement:  Good  Insight:  Good  Psychomotor Activity:  Normal  Concentration:  Concentration: Good and Attention Span: Good  Recall:  Good  Fund of Knowledge:  Good  Language: Good  Akathisia:  Negative  Handed:  Right  AIMS (if indicated): not done  Assets:  Communication Skills Desire for Improvement Financial Resources/Insurance Housing  ADL's:  Intact  Cognition: WNL  Sleep:  Good    Screenings: AIMS   Flowsheet Row Admission (Discharged) from 10/29/2017 in [facility] Total Score 0    AUDIT   Flowsheet Row Admission (Discharged) from 10/29/2017 in [facility]  Alcohol Use Disorder Identification Test Final Score (AUDIT) 2       Assessment and Plan: Patient seems to be stable on this regimen.  We will keep the same regimen for now.  1. MDD (major depressive disorder), recurrent, in full remission ([place])  - doxepin (SINEQUAN) 10 MG capsule; Take 1 to 2 capsules at bedtime as needed for sleep  Dispense: 180 capsule; Refill: 1 - citalopram (CELEXA) 40 MG tablet; Take 1 tablet (40 mg total) by mouth daily.  Dispense: 90 tablet; Refill: 1  Continue individual therapy with Ms. Lady Deutscher. Follow-up in 2 months  Nevada Crane, MD 03/09/2020, 3:23 PM

## 2020-04-04 ENCOUNTER — Telehealth (HOSPITAL_COMMUNITY): Payer: Self-pay | Admitting: *Deleted

## 2020-04-04 NOTE — Telephone Encounter (Signed)
VM left for writer stating he was moving his appt up with his provider because he is not doing well and feels COVID epidemic is responsible. He believes he needs to be put back on a mood stabilizer but not Lamictal because historically he didn't do well on it. He concluded at the end of his message that he could discuss this need with Dr Toy Care when he sees her on Wed 04/06/20.

## 2020-04-06 ENCOUNTER — Telehealth (INDEPENDENT_AMBULATORY_CARE_PROVIDER_SITE_OTHER): Payer: 59 | Admitting: Psychiatry

## 2020-04-06 ENCOUNTER — Encounter (HOSPITAL_COMMUNITY): Payer: Self-pay | Admitting: Psychiatry

## 2020-04-06 ENCOUNTER — Other Ambulatory Visit: Payer: Self-pay

## 2020-04-06 DIAGNOSIS — F3341 Major depressive disorder, recurrent, in partial remission: Secondary | ICD-10-CM | POA: Diagnosis not present

## 2020-04-06 MED ORDER — ARIPIPRAZOLE 2 MG PO TABS: 2.0000 mg | ORAL_TABLET | Freq: Every day | ORAL | 1 refills | Status: DC

## 2020-04-06 NOTE — Progress Notes (Signed)
[facility] MD/PA/NP OP Progress Note  Virtual Visit via Telephone Note  I connected with Kevin Ramos on 04/06/20 at  2:50 PM EST by telephone and verified that I am speaking with the correct person using two identifiers.  Location: Patient: home Provider: Clinic   I discussed the limitations, risks, security and privacy concerns of performing an evaluation and management service by telephone and the availability of in person appointments. I also discussed with the patient that there may be a patient responsible charge related to this service. The patient expressed understanding and agreed to proceed.   I provided 18 minutes of non-face-to-face time during this encounter.     04/06/2020 12:00 PM Kevin Ramos  MRN:  [id number]  Chief Complaint: " I feel my depression has gotten worse."  HPI: Patient had contacted the clinic couple of days goals to report that he does not think he was doing too well.  He was feeling overwhelmed and more depressed than usual.  He was given an appointment for today. Today patient reported that for the past few weeks he has been feeling more depressed than usual.  He feels stressed and somewhat anxious.  He stated that he knows he has a lot of stressors including being unemployed and being stuck at home right now. 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  He really wants to relocate back to [place] and hopes to be out of here by March. He stated that lately he feels like his mood is sad and he does not enjoy the things that he used to enjoy.  He feels like he does not have the energy to do anything.  He feels less motivated. He denies any significant changes with his sleep or appetite.  He denied any suicidal thoughts. Informed that in the past he has taken Lamictal however that made him sick.  Writer clarified that generally Lamictal is used for mood stabilization and bipolar 2 disorder  and if that was ever a concern.  Patient stated that he is never really been officially diagnosed with bipolar disorder however currently psychiatrist in the past did worry about it.  He informed that his mother does have bipolar disorder and is pretty stable for now.  Writer asked patient questions from the mood disorder questionnaire to rule out any symptoms suggestive of hypomania or mania. Patient reported that occasionally he does feel very happy and energetic but that is after he receives some good news.  He may be impulsive and may spend a little too much money on shopping but denies any reckless behaviors.  He denied any decreased need for sleep anytime.  He denied engaging in high risk activities when he is happy.  Based on his responses on the mood disorder questionnaire, writer rule out bipolar disorder as he did not meet criteria for hypomania or mania.  Patient found that he has taken Wellbutrin, Effexor and Abilify in the past.  He stated that Wellbutrin did not suit him well and he did not do great on it.  He also informed that in the past he was tried on a combination of Effexor and Abilify together and that was really ineffective too.  He stated that at one point he tried a medication that caused him to stutter.  Writer asked him if he would like to try augmentation of his Celexa with Abilify which can be started at a low dose  and we can see how he does. Potential side effects of medication and risks vs benefits of treatment vs non-treatment were explained and discussed. All questions were answered.  Patient stated that he was willing to try it.  He has been seeing his therapist Ms. Gregary Signs regularly, had a session earlier this morning.  Writer suggested that we add the Abilify to his regimen of Celexa and then touch base in a month.  He already has an appointment scheduled with Probation officer in February.  Visit Diagnosis:    ICD-10-CM   1. MDD (major depressive disorder), recurrent, in  partial remission ([facility])  F33.41 ARIPiprazole (ABILIFY) 2 MG tablet    Past Psychiatric History: MDD  Past Medical History:  Past Medical History:  Diagnosis Date  . Acne   . Depression   . Dysplastic nevus 02/09/2019   right upper back, moderate atypia, close to margin.   No past surgical history on file.  Family Psychiatric History: denied  Family History:  Family History  Problem Relation Age of Onset  . Hypoparathyroidism Mother     Social History:  Social History   Socioeconomic History  . Marital status: Single    Spouse name: Not on file  . Number of children: Not on file  . Years of education: Not on file  . Highest education level: Not on file  Occupational History  . Not on file  Tobacco Use  . Smoking status: Never Smoker  . Smokeless tobacco: Never Used  Substance and Sexual Activity  . Alcohol use: Never  . Drug use: No  . Sexual activity: Not on file  Other Topics Concern  . Not on file  Social History Narrative  . Not on file   Social Determinants of Health   Financial Resource Strain: Not on file  Food Insecurity: Not on file  Transportation Needs: Not on file  Physical Activity: Not on file  Stress: Not on file  Social Connections: Not on file    Allergies: No Known Allergies  Metabolic Disorder Labs: Lab Results  Component Value Date   HGBA1C 4.9 10/29/2017   MPG 93.93 10/29/2017   Lab Results  Component Value Date   PROLACTIN 14.3 06/30/2019   PROLACTIN 17.9 (H) 05/05/2019   Lab Results  Component Value Date   CHOL 162 10/29/2017   TRIG 192 (H) 10/29/2017   HDL 35 (L) 10/29/2017   CHOLHDL 4.6 10/29/2017   VLDL 38 10/29/2017   LDLCALC 89 10/29/2017   Lab Results  Component Value Date   TSH 4.25 12/14/2019   TSH 2.65 08/19/2019    Therapeutic Level Labs: No results found for: LITHIUM No results found for: VALPROATE No components found for:  CBMZ  Current Medications: Current Outpatient Medications  Medication  Sig Dispense Refill  . ARIPiprazole (ABILIFY) 2 MG tablet Take 1 tablet (2 mg total) by mouth daily. 30 tablet 1  . ACCUTANE 40 MG capsule Take 1 capsule (40 mg total) by mouth 2 (two) times daily. (Patient not taking: Reported on 12/14/2019) 60 capsule 0  . acetaminophen (TYLENOL) 500 MG tablet Take 500 mg by mouth every 6 (six) hours as needed.    . ALPRAZolam (XANAX) 0.5 MG tablet Take 0.5 mg by mouth at bedtime as needed for anxiety.    Renae Gloss 4 MG/24HR PT24 patch APPLY ONE PATCH TO THE SKIN DAILY 30 patch 2  . calcitRIOL (ROCALTROL) 0.5 MCG capsule TAKE TWO CAPSULES BY MOUTH DAILY 180 capsule 2  . Calcium Carbonate-Vit D-Min (  CALCIUM 1200 PO) Take 2 capsules by mouth daily.     . Cholecalciferol (VITAMIN D) 50 MCG (2000 UT) tablet Take 2,000 Units by mouth daily.    . citalopram (CELEXA) 40 MG tablet Take 1 tablet (40 mg total) by mouth daily. 90 tablet 1  . clotrimazole (MYCELEX) 10 MG troche Take 1 tablet (10 mg total) by mouth 5 (five) times daily. 35 Troche 0  . DESCOVY 200-25 MG tablet Take 1 tablet by mouth daily. (Patient not taking: Reported on 12/14/2019)    . doxepin (SINEQUAN) 10 MG capsule Take 1 to 2 capsules at bedtime as needed for sleep 180 capsule 1  . fluticasone (FLONASE) 50 MCG/ACT nasal spray Place into the nose.    . ibuprofen (ADVIL) 200 MG tablet Take 200 mg by mouth every 6 (six) hours as needed.    . ISOtretinoin (ACCUTANE) 40 MG capsule Take 80 mg by mouth daily. (Patient not taking: Reported on 12/14/2019)    . levothyroxine (SYNTHROID) 75 MCG tablet TAKE ONE TABLET BY MOUTH DAILY BEFORE BREAKFAST 90 tablet 0  . meloxicam (MOBIC) 7.5 MG tablet Take 7.5 mg by mouth daily. (Patient not taking: Reported on 02/16/2020)    . pimecrolimus (ELIDEL) 1 % cream Apply topically.     No current facility-administered medications for this visit.     Psychiatric Specialty Exam: Review of Systems  There were no vitals taken for this visit.There is no height or weight on  file to calculate BMI.  General Appearance: unable to assess due to phone visit  Eye Contact:  unable to assess due to phone visit  Speech:  Clear and Coherent and Normal Rate  Volume:  Normal  Mood:  Depressed  Affect:  Congruent  Thought Process:  Goal Directed, Linear and Descriptions of Associations: Intact  Orientation:  Full (Time, Place, and Person)  Thought Content: Logical   Suicidal Thoughts:  No  Homicidal Thoughts:  No  Memory:  Recent;   Good Remote;   Good  Judgement:  Good  Insight:  Good  Psychomotor Activity:  Normal  Concentration:  Concentration: Good and Attention Span: Good  Recall:  Good  Fund of Knowledge: Good  Language: Good  Akathisia:  Negative  Handed:  Right  AIMS (if indicated): not done  Assets:  Communication Skills Desire for Improvement Financial Resources/Insurance Housing  ADL's:  Intact  Cognition: WNL  Sleep:  Fair    Screenings: Mood disorder Questionnaire incorporated in the assessment today.   Assessment and Plan: Patient has noticed some worsening of his depression symptoms in the past few weeks due to several ongoing stressors.  He really wants to return back to [place] by March.  We will add Abilify to his current regimen of Celexa 40 mg for augmentation purposes. Potential side effects of medication and risks vs benefits of treatment vs non-treatment were explained and discussed. All questions were answered.   1. MDD (major depressive disorder), recurrent, in partial remission ([place])  - Start ARIPiprazole (ABILIFY) 2 MG tablet; Take 1 tablet (2 mg total) by mouth daily.  Dispense: 30 tablet; Refill: 1 - Continue Celexa 40 mg daily.   Continue individual therapy with Ms. Lady Deutscher. Follow-up in 4 weeks.  [place] Crane, MD 04/06/2020, 12:00 PM

## 2020-04-07 ENCOUNTER — Telehealth (HOSPITAL_COMMUNITY): Payer: Self-pay | Admitting: *Deleted

## 2020-04-07 NOTE — Telephone Encounter (Signed)
Kevin Ramos called concerned her took his first dose of Abilify this am, ate food shortly after taking it and did not crush or break the pill before taking it. About one hour and 30 min after taking the Abilify the threw up several times. Nothing else was new or different only the Abilify. I will forward concern to Dr Toy Care and call patient back as needed.

## 2020-04-07 NOTE — Telephone Encounter (Addendum)
That could have happened to reasons unrelated to Abilify so my suggestion is that he should retry taking it tomorrow and see how he does tomorrow.

## 2020-04-07 NOTE — Telephone Encounter (Signed)
Informed patient of Dr Starleen Arms direction to try taking Abilify again tomorrow and see how it goes. He is to call me tomorrow with the details. He agreed to try again.

## 2020-04-08 ENCOUNTER — Telehealth (HOSPITAL_COMMUNITY): Payer: Self-pay | Admitting: *Deleted

## 2020-04-08 NOTE — Telephone Encounter (Signed)
Call from patient as a follow up to yesterdays call. He started Abilify yesterday and vomited after wards and Dr Toy Care told him to try it again today to see if it was the Abilify or a number of other possibilities. He reported today he never stopped feeling sick yesterday and threw up again this am. Dr Toy Care out of the office till [place]. In her absence told him to stop it but if he feels ill tomorrow independent of taking the Abilify he may be sick and for him to call tues and we will discuss it again with Dr Toy Care and have more information from his experience without it.

## 2020-04-09 NOTE — Telephone Encounter (Signed)
Agree with the plan.

## 2020-04-12 ENCOUNTER — Telehealth (HOSPITAL_COMMUNITY): Payer: Self-pay | Admitting: *Deleted

## 2020-04-12 NOTE — Telephone Encounter (Signed)
Okay, we have two options, one is if he wants to retry Abilify again he can do so and if not then we can do a trial of Latuda. Starting dose will be Latuda 20 mg HS with supper.

## 2020-04-12 NOTE — Telephone Encounter (Signed)
Patient called today with update since last week. He reports he felt well all weekend, no further incidents of nausea or vomiting. Also, wanted the Dr to know that his mom has bipolar, depression and anxiety and did well on Latuda. He has an appt with the provider in several weeks. Told him I would call him back if Dr wanted to make any changes prior to his appt.

## 2020-04-13 MED ORDER — LATUDA 20 MG PO TABS: 20.0000 mg | ORAL_TABLET | Freq: Every day | ORAL | 1 refills | Status: DC

## 2020-04-13 NOTE — Addendum Note (Signed)
Addended by: [place] Crane on: 04/13/2020 10:45 AM   Modules accepted: Orders

## 2020-04-13 NOTE — Telephone Encounter (Signed)
Called Kevin Ramos this am to inform him Dr Toy Care was agreeable to trying him on Latuda. He was happy to try it and will ask Dr Toy Care to go ahead with calling it in to [facility] in [place].

## 2020-04-13 NOTE — Telephone Encounter (Addendum)
Okay, I am sending the prescription for Latuda 20 mg HS with supper. Just a heads up, this medication can be expensive depending on your insurance coverage. Tell him about online coupons and if necessary we can provide him with samples. He lives in [place], not sure which clinic is closer ? [place] or [place] office.  May need PA. Has failed Lamictal, Abilify, Seroquel. All caused him side effects, weight gain.

## 2020-04-14 ENCOUNTER — Ambulatory Visit: Payer: 59 | Admitting: Dermatology

## 2020-04-14 ENCOUNTER — Other Ambulatory Visit: Payer: Self-pay

## 2020-04-14 ENCOUNTER — Telehealth (HOSPITAL_COMMUNITY): Payer: Self-pay | Admitting: *Deleted

## 2020-04-14 DIAGNOSIS — L739 Follicular disorder, unspecified: Secondary | ICD-10-CM

## 2020-04-14 DIAGNOSIS — L309 Dermatitis, unspecified: Secondary | ICD-10-CM | POA: Diagnosis not present

## 2020-04-14 DIAGNOSIS — K130 Diseases of lips: Secondary | ICD-10-CM | POA: Diagnosis not present

## 2020-04-14 MED ORDER — LATUDA 20 MG PO TABS
20.0000 mg | ORAL_TABLET | Freq: Every day | ORAL | 1 refills | Status: DC
Start: 2020-04-14 — End: 2020-05-12

## 2020-04-14 MED ORDER — KETOCONAZOLE 2 % EX CREA: 1.0000 "application " | TOPICAL_CREAM | Freq: Two times a day (BID) | CUTANEOUS | 0 refills | Status: AC

## 2020-04-14 MED ORDER — CLINDAMYCIN PHOSPHATE 1 % EX SOLN: Freq: Two times a day (BID) | CUTANEOUS | 1 refills | Status: DC

## 2020-04-14 MED ORDER — MUPIROCIN 2 % EX OINT: 1.0000 "application " | TOPICAL_OINTMENT | Freq: Two times a day (BID) | CUTANEOUS | 0 refills | Status: DC

## 2020-04-14 MED ORDER — HYDROCORTISONE 0.5 % EX OINT: 1.0000 "application " | TOPICAL_OINTMENT | Freq: Two times a day (BID) | CUTANEOUS | 0 refills | Status: DC

## 2020-04-14 MED ORDER — TRIAMCINOLONE ACETONIDE 0.1 % EX OINT: 1.0000 "application " | TOPICAL_OINTMENT | Freq: Two times a day (BID) | CUTANEOUS | 1 refills | Status: DC | PRN

## 2020-04-14 NOTE — Patient Instructions (Addendum)
Topical steroids (such as triamcinolone, fluocinolone, fluocinonide, mometasone, clobetasol, halobetasol, betamethasone, hydrocortisone) can cause thinning and lightening of the skin if they are used for too long in the same area. Your physician has selected the right strength medicine for your problem and area affected on the body. Please use your medication only as directed by your physician to prevent side effects.   Avoid applying to face, groin, and axilla. Use as directed. Risk of skin atrophy with long-term use reviewed.   Gentle Skin Care Guide   Use a mild soap like Dove, Vanicream, Cetaphil, CeraVe.   4. Use soap only where you need it. On most days, use it under your arms, between your legs, and on your feet. Let the water rinse other areas unless visibly dirty.  5. When you get out of the bath/shower, use a towel to gently blot your skin dry, don't rub it.  6. While your skin is still a little damp, apply a moisturizing cream such as Vanicream, CeraVe, Cetaphil, Eucerin, Sarna lotion or plain Vaseline Jelly. For hands apply Neutrogena [place] Hand Cream or Excipial Hand Cream.  7. Reapply moisturizer any time you start to itch or feel dry.  8. Sometimes using free and clear laundry detergents can be helpful. Fabric softener sheets should be avoided. Downy Free & Gentle liquid, or any liquid fabric softener that is free of dyes and perfumes, it acceptable to use  9. If your doctor has given you prescription creams you may apply moisturizers over them

## 2020-04-14 NOTE — Telephone Encounter (Signed)
Called Kevin Ramos to update where we were with his [name] and inform him he has family planning medicaid. States found out recently he had family planning mcd when seen at the ED. He did not intend to sign up for that and unsure how to get off of it or change it. Informed he could pick up samples from [facility] today and provided address to get there. Additionally gave him name and number to a woman who matches insurance to people who was referred to me by [facility] for him to call. He took down the information. Expressed his appreciation and plans to follow thru.

## 2020-04-14 NOTE — Telephone Encounter (Signed)
Rx sent to [facility]

## 2020-04-14 NOTE — Progress Notes (Signed)
   Follow-Up Visit   Subjective  Kevin Ramos is a 25 y.o. male who presents for the following: Skin Problem (Patient states had itchy spots on back of neck and noticed in scalp. Not sure if it may be coming from shampoo.Also has noticed when removing testosterone patches, he develops a rash. Patient complains of bump in corner of left side mouth. Would also like recommendations on face wash. ).  The following portions of the chart were reviewed this encounter and updated as appropriate:  Tobacco  Allergies  Meds  Problems  Med Hx  Surg Hx  Fam Hx      Objective  Well appearing patient in no apparent distress; mood and affect are within normal limits.  A focused examination was performed including face, neck, chest and back and and bilateral arms. Relevant physical exam findings are noted in the Assessment and Plan.  Objective  left angle of mouth: Crusted fissure left angle of mouth   Objective  occipital scalp: Excoriated perifollicular papules   Objective  bilateral arms: Pink scaly well-demarcated plaques at arms   Assessment & Plan  Angular cheilitis left angle of mouth  Start Ketoconazole 2% cream apply twice daily to corner of mouth for up to 1 week.  Start Mupirocine 2% ointment apply twice daily to corner of mouth for up to 1 week.  Hydrocortisone 2.5% ointment apply to corner of mouth twice daily for up to 1 week.   Topical steroids (such as triamcinolone, fluocinolone, fluocinonide, mometasone, clobetasol, halobetasol, betamethasone, hydrocortisone) can cause thinning and lightening of the skin if they are used for too long in the same area. Your physician has selected the right strength medicine for your problem and area affected on the body. Please use your medication only as directed by your physician to prevent side effects.    Ordered Medications: ketoconazole (NIZORAL) 2 % cream hydrocortisone ointment 0.5 % mupirocin ointment (BACTROBAN) 2  %  Folliculitis occipital scalp  Start clindamycin 1% external solution apply twice daily to affected areas of scalp  If not improving, he will call and we can add po doxycycline.     Ordered Medications: clindamycin (CLEOCIN T) 1 % external solution  Dermatitis bilateral arms  Irritant vs allergic contact dermatitis  Recommend discontinuing testosterone patches in favor of alternative form of testosterone if possible  Start Triamcinolone 0.1 % ointment apply to affected areas of skin twice a day for up to 2 weeks. Avoid applying to face, groin, and axilla. Use as directed. Risk of skin atrophy with long-term use reviewed.   Topical steroids (such as triamcinolone, fluocinolone, fluocinonide, mometasone, clobetasol, halobetasol, betamethasone, hydrocortisone) can cause thinning and lightening of the skin if they are used for too long in the same area. Your physician has selected the right strength medicine for your problem and area affected on the body. Please use your medication only as directed by your physician to prevent side effects.     Ordered Medications: triamcinolone ointment (KENALOG) 0.1 %  Return for keep appointment as scheduled for follow up .  I, Ruthell Rummage, CMA, am acting as scribe for Forest Gleason, MD.  Documentation: I have reviewed the above documentation for accuracy and completeness, and I agree with the above.  Forest Gleason, MD

## 2020-04-14 NOTE — Telephone Encounter (Signed)
Direce called [place] [facility] for further help re patients insurance and PA for his Latuda as we have had little assit thru [employer]. [place] [facility] informed us he had family planning medicaid which wont assit with his medications. [facility], who has been his pharmacy and have a lot of knowledge re meds and insurance and they have samples of Latuda for a month of coverage for him but to release samples Dr [facility] has to send a Rx to them. Will notify [employer] of his lack of insurance to initiate patient assistance.

## 2020-04-14 NOTE — Addendum Note (Signed)
Addended by: [place] Crane on: 04/14/2020 09:09 AM   Modules accepted: Orders

## 2020-04-15 ENCOUNTER — Encounter: Payer: Self-pay | Admitting: Dermatology

## 2020-04-19 ENCOUNTER — Other Ambulatory Visit: Payer: Self-pay

## 2020-04-19 DIAGNOSIS — K130 Diseases of lips: Secondary | ICD-10-CM

## 2020-04-19 MED ORDER — HYDROCORTISONE 2.5 % EX OINT: TOPICAL_OINTMENT | CUTANEOUS | 0 refills | Status: DC

## 2020-04-19 NOTE — Progress Notes (Signed)
Updated rx sent to pharmacy

## 2020-04-22 ENCOUNTER — Telehealth: Payer: Self-pay | Admitting: Endocrinology

## 2020-04-22 NOTE — Telephone Encounter (Signed)
Patient says that he is having more tingling in his fingers and some feeling of tightening of his jaw for the last week and a half despite taking his calcitriol and calcium as directed.  Advised him to take calcitriol twice a day instead of once a day and double the amount of calcium.   He will need to come in for labs on Monday

## 2020-04-22 NOTE — Telephone Encounter (Signed)
Patient requests to be called at ph# [phone] re: Patient states he feels like he has no Calcium in his body. Patient states he has been taking his medications methodically and should not be feeling this way.

## 2020-04-22 NOTE — Telephone Encounter (Signed)
Please see below.

## 2020-04-25 ENCOUNTER — Other Ambulatory Visit: Payer: Self-pay | Admitting: Endocrinology

## 2020-04-25 DIAGNOSIS — E201 Pseudohypoparathyroidism: Secondary | ICD-10-CM

## 2020-04-26 ENCOUNTER — Other Ambulatory Visit: Payer: 59

## 2020-04-26 ENCOUNTER — Telehealth (HOSPITAL_COMMUNITY): Payer: Self-pay | Admitting: *Deleted

## 2020-04-26 NOTE — Telephone Encounter (Signed)
Prior auth applied for thru [employer] [employer] which is who does prior [place] for pharmacy for [facility]. Required additional information sent to them, faxed requested notes to prove previous medication failures and need for the [facility], which is the drug requiring PA.

## 2020-04-27 ENCOUNTER — Other Ambulatory Visit: Payer: Self-pay

## 2020-04-27 ENCOUNTER — Other Ambulatory Visit (INDEPENDENT_AMBULATORY_CARE_PROVIDER_SITE_OTHER): Payer: 59

## 2020-04-27 ENCOUNTER — Telehealth (HOSPITAL_COMMUNITY): Payer: Self-pay | Admitting: *Deleted

## 2020-04-27 ENCOUNTER — Other Ambulatory Visit: Payer: Self-pay | Admitting: Endocrinology

## 2020-04-27 ENCOUNTER — Telehealth: Payer: Self-pay | Admitting: Endocrinology

## 2020-04-27 DIAGNOSIS — E201 Pseudohypoparathyroidism: Secondary | ICD-10-CM

## 2020-04-27 LAB — CALCIUM: Calcium: 9.2 mg/dL (ref 8.4–10.5)

## 2020-04-27 MED ORDER — TESTOSTERONE 20.25 MG/ACT (1.62%) TD GEL: TRANSDERMAL | 3 refills | Status: DC

## 2020-04-27 NOTE — Telephone Encounter (Signed)
Pt requests to be called regarding the medication that was sent in instead of his Androderm. Pt says can call with lab results at same time or whatever works.  Ph#  [phone]

## 2020-04-27 NOTE — Telephone Encounter (Signed)
Called [employer] to follow up with PA request for Latuda. He was denied because he doesn't have a diagnosis of schizophrenia or bipolar. Will bring this to Dr Malachy Moan attention as well as the patient.

## 2020-04-27 NOTE — Telephone Encounter (Signed)
This has come back needing a PA

## 2020-04-27 NOTE — Telephone Encounter (Signed)
AndroGel 1.62 has been sent instead of Androderm, please notify patient

## 2020-04-27 NOTE — Telephone Encounter (Signed)
I spoke with patient to let him know that his androderm patches were denied by his insurance company so Dr. Dwyane Dee sent in the Testosterone Gel, which also came back needing a PA. A note has been sent to Dr. Dwyane Dee letting him now that it now needs an appeal.

## 2020-04-29 ENCOUNTER — Telehealth (HOSPITAL_COMMUNITY): Payer: Self-pay | Admitting: *Deleted

## 2020-04-29 NOTE — Telephone Encounter (Signed)
Call from [place] asking what our address is to send the PAP paperwork he completed for his Latuda. He was denied thru his [employer] because his dx is not bipolar or schizophrenia, he may not be eligible for PAP because he does have insurance. Will forward it when I receive it to [facility] to try to get it thru. He reports he feels the best on it he has felt in a long time, so it would be a shame for him not to be able to continue the [place].

## 2020-04-29 NOTE — Telephone Encounter (Signed)
Do I need to send a Rx to [employer] [facility] for [place] ??

## 2020-04-29 NOTE — Telephone Encounter (Signed)
He doesn't need an rx called in until we sign the PAP paperwork.

## 2020-05-02 ENCOUNTER — Other Ambulatory Visit: Payer: Self-pay

## 2020-05-02 ENCOUNTER — Ambulatory Visit: Payer: PRIVATE HEALTH INSURANCE | Admitting: Dermatology

## 2020-05-02 DIAGNOSIS — D235 Other benign neoplasm of skin of trunk: Secondary | ICD-10-CM

## 2020-05-02 DIAGNOSIS — L700 Acne vulgaris: Secondary | ICD-10-CM

## 2020-05-02 DIAGNOSIS — K130 Diseases of lips: Secondary | ICD-10-CM

## 2020-05-02 DIAGNOSIS — L905 Scar conditions and fibrosis of skin: Secondary | ICD-10-CM

## 2020-05-02 DIAGNOSIS — L739 Follicular disorder, unspecified: Secondary | ICD-10-CM | POA: Diagnosis not present

## 2020-05-02 DIAGNOSIS — D239 Other benign neoplasm of skin, unspecified: Secondary | ICD-10-CM

## 2020-05-02 NOTE — Progress Notes (Signed)
   Follow-Up Visit   Subjective  Kevin Ramos is a 25 y.o. male who presents for the following: Acne (3 month follow up - not using any medication right now because he has been clear.) and Follow-up (He saw Dr. Laurence Ferrari for folliculitis of the scalp and chelitis. He used the medications she prescribed they have healed/).  The following portions of the chart were reviewed this encounter and updated as appropriate:   Tobacco  Allergies  Meds  Problems  Med Hx  Surg Hx  Fam Hx     Review of Systems:  No other skin or systemic complaints except as noted in HPI or Assessment and Plan.  Objective  Well appearing patient in no apparent distress; mood and affect are within normal limits.  A focused examination was performed including scalp, face, back, right hip. Relevant physical exam findings are noted in the Assessment and Plan.  Objective  Head - Anterior (Face): Clear today with scarring of face and shoulders  Objective  Right hip: Scar  Objective  Right Upper Back: Scar with no evidence of recurrence.   Objective  Scalp: Clear today  Objective  Oral commissure areas: Clear today   Assessment & Plan  Acne vulgaris Head - Anterior (Face)  25m s/p Isotretinoin 40wk course (2nd course) Total mg of Isotretinoin 17,100 Total mg/kg - 208.54mg /kg  Scar Right hip  Secondary to hip surgery  Recommend Serica gel - samples given  Dysplastic nevus Right Upper Back  Clear. Observe for recurrence. Call clinic for new or changing lesions.  Recommend regular skin exams, daily broad-spectrum spf 30+ sunscreen use, and photoprotection.     Folliculitis Scalp  Resolved. Restart Clindamycin with flares  Other Related Medications clindamycin (CLEOCIN T) 1 % external solution  Cheilitis Oral commissure areas  Resolved. Restart HC 2.5% cream, Ketoconazole 2% cream with flares  Return in about 1 year (around 05/02/2021).   I, Ashok Cordia, CMA, am acting as  scribe for Sarina Ser, MD .  Documentation: I have reviewed the above documentation for accuracy and completeness, and I agree with the above.  Sarina Ser, MD

## 2020-05-03 ENCOUNTER — Telehealth: Payer: Self-pay

## 2020-05-03 NOTE — Telephone Encounter (Signed)
Hi, see the message from [place]. His PAP request for Latuda denied. Can you call him to inform him. Thanks

## 2020-05-03 NOTE — Telephone Encounter (Signed)
LATUDA PATIENT ASSISTANCE WAS DENIED BY [facility] PATIENT ASSIST[facility] TODAY DUE TO THE FACT THAT PATIENT HAS ACTIVE INSURANCE.  HE DOES NOT QUALIFY FOR PARTICIPATION IN THE PROGRAM AT THIS TIME.

## 2020-05-03 NOTE — Telephone Encounter (Signed)
PAP request for Latuda denied. Called to inform patient .

## 2020-05-03 NOTE — Telephone Encounter (Signed)
FAXED COMPLETED LATUDA APP TO [facility] PAP TODAY. WILL FOLLOW-UP IN 3 BUSINESS DAYS TO ALLOW TIME FOR PROCESSING.

## 2020-05-04 ENCOUNTER — Other Ambulatory Visit: Payer: Self-pay

## 2020-05-04 ENCOUNTER — Encounter (HOSPITAL_COMMUNITY): Payer: Self-pay | Admitting: Psychiatry

## 2020-05-04 ENCOUNTER — Telehealth (INDEPENDENT_AMBULATORY_CARE_PROVIDER_SITE_OTHER): Payer: 59 | Admitting: Psychiatry

## 2020-05-04 DIAGNOSIS — F3342 Major depressive disorder, recurrent, in full remission: Secondary | ICD-10-CM

## 2020-05-04 MED ORDER — BREXPIPRAZOLE 1 MG PO TABS: 1.0000 mg | ORAL_TABLET | Freq: Every day | ORAL | 1 refills | Status: DC

## 2020-05-04 MED ORDER — CITALOPRAM HYDROBROMIDE 40 MG PO TABS: 40.0000 mg | ORAL_TABLET | Freq: Every day | ORAL | 1 refills | Status: DC

## 2020-05-04 NOTE — Addendum Note (Signed)
Addended by: [place] Crane on: 05/04/2020 03:55 PM   Modules accepted: Orders

## 2020-05-04 NOTE — Progress Notes (Signed)
[facility] MD/PA/NP OP Progress Note  Virtual Visit via Video Note  I connected with Kevin Tumolo on 05/04/20 at  3:30 PM EST by a video enabled telemedicine application and verified that I am speaking with the correct person using two identifiers.  Location: Patient: Home Provider: Clinic   I discussed the limitations of evaluation and management by telemedicine and the availability of in person appointments. The patient expressed understanding and agreed to proceed.  I provided 16 minutes of non-face-to-face time during this encounter.       05/04/2020 3:48 PM Simuel Stebner  MRN:  [id number]  Chief Complaint: " [name] has helped me so much."  HPI: Patient was last seen on January 12 and at that time had reported that he had started to feel more depressed than usual lately and had asked if medicine can be recommended that can be added to Celexa for augmentation effects.  Writer recommended trial of Abilify.  Patient had taken the Abilify for 2 or 3 days however he developed lot of GI side effects and therefore discontinued taking it.  Writer at offered trial of Latuda and patient had picked up the sample from the clinic. Few days later patient had called to report that [place] had helped him immensely.  The office staff started the prior authorization process however we found out yesterday that his insurance would not cover [place].  Patient seen today and reported that he is doing much better after being started on Latuda.  He still has a week's worth of medicine left.  He stated that his mother also takes [place] and is doing really well on it. Writer informed him that unfortunately his insurance would not cover it.  Writer given the option of coming to pick up another box of sample of Latuda so that he can continue taking it for next month.  Patient asked if there is anything else that he can try which may be covered by his insurance in the future.  Writer given the option of  trying Rexulti which is FDA approved for augmentation of antidepressants.  Patient stated that he is willing to try that and was agreeable to try samples. Writer informed him that we will provide him with samples for 2 to 3 weeks and based on his response we can try getting it prior authorized from his insurance. Potential side effects of medication and risks vs benefits of treatment vs non-treatment were explained and discussed. All questions were answered. Patient was agreeable to try it.  Patient is still planning to move back to [place] however has not decided the actual date yet.  He is contemplating early April but is not sure.   Visit Diagnosis:    ICD-10-CM   1. MDD (major depressive disorder), recurrent, in full remission ([place])  F33.42     Past Psychiatric History: MDD  Past Medical History:  Past Medical History:  Diagnosis Date  . Acne   . Depression   . Dysplastic nevus 02/09/2019   right upper back, moderate atypia, close to margin.   History reviewed. No pertinent surgical history.  Family Psychiatric History: denied  Family History:  Family History  Problem Relation Age of Onset  . Hypoparathyroidism Mother     Social History:  Social History   Socioeconomic History  . Marital status: Single    Spouse name: Not on file  . Number of children: Not on file  . Years of education: Not on file  . Highest education level:  Not on file  Occupational History  . Not on file  Tobacco Use  . Smoking status: Never Smoker  . Smokeless tobacco: Never Used  Substance and Sexual Activity  . Alcohol use: Never  . Drug use: No  . Sexual activity: Not on file  Other Topics Concern  . Not on file  Social History Narrative  . Not on file   Social Determinants of Health   Financial Resource Strain: Not on file  Food Insecurity: Not on file  Transportation Needs: Not on file  Physical Activity: Not on file  Stress: Not on file  Social Connections: Not on file     Allergies: No Known Allergies  Metabolic Disorder Labs: Lab Results  Component Value Date   HGBA1C 4.9 10/29/2017   MPG 93.93 10/29/2017   Lab Results  Component Value Date   PROLACTIN 14.3 06/30/2019   PROLACTIN 17.9 (H) 05/05/2019   Lab Results  Component Value Date   CHOL 162 10/29/2017   TRIG 192 (H) 10/29/2017   HDL 35 (L) 10/29/2017   CHOLHDL 4.6 10/29/2017   VLDL 38 10/29/2017   LDLCALC 89 10/29/2017   Lab Results  Component Value Date   TSH 4.25 12/14/2019   TSH 2.65 08/19/2019    Therapeutic Level Labs: No results found for: LITHIUM No results found for: VALPROATE No components found for:  CBMZ  Current Medications: Current Outpatient Medications  Medication Sig Dispense Refill  . ACCUTANE 40 MG capsule Take 1 capsule (40 mg total) by mouth 2 (two) times daily. (Patient not taking: No sig reported) 60 capsule 0  . acetaminophen (TYLENOL) 500 MG tablet Take 500 mg by mouth every 6 (six) hours as needed.    . ALPRAZolam (XANAX) 0.5 MG tablet Take 0.5 mg by mouth at bedtime as needed for anxiety.    . calcitRIOL (ROCALTROL) 0.5 MCG capsule TAKE TWO CAPSULES BY MOUTH DAILY 180 capsule 2  . Calcium Carbonate-Vit D-Min (CALCIUM 1200 PO) Take 2 capsules by mouth daily.     . Cholecalciferol (VITAMIN D) 50 MCG (2000 UT) tablet Take 2,000 Units by mouth daily.    . citalopram (CELEXA) 40 MG tablet Take 1 tablet (40 mg total) by mouth daily. 90 tablet 1  . clindamycin (CLEOCIN T) 1 % external solution Apply topically in the morning and at bedtime. scalp 30 mL 1  . clotrimazole (MYCELEX) 10 MG troche Take 1 tablet (10 mg total) by mouth 5 (five) times daily. 35 Troche 0  . DESCOVY 200-25 MG tablet Take 1 tablet by mouth daily. (Patient not taking: No sig reported)    . doxepin (SINEQUAN) 10 MG capsule Take 1 to 2 capsules at bedtime as needed for sleep 180 capsule 1  . fluticasone (FLONASE) 50 MCG/ACT nasal spray Place into the nose.    . hydrocortisone 2.5 %  ointment Apply to corner of mouth twice daily for up to 1 week. 30 g 0  . ibuprofen (ADVIL) 200 MG tablet Take 200 mg by mouth every 6 (six) hours as needed.    . ISOtretinoin (ACCUTANE) 40 MG capsule Take 80 mg by mouth daily. (Patient not taking: No sig reported)    . ketoconazole (NIZORAL) 2 % cream Apply 1 application topically in the morning and at bedtime. Apply to corner of mouth 15 g 0  . levothyroxine (SYNTHROID) 75 MCG tablet TAKE ONE TABLET BY MOUTH DAILY BEFORE BREAKFAST 90 tablet 0  . lurasidone (LATUDA) 20 MG TABS tablet Take 1 tablet (20  mg total) by mouth daily with supper. 30 tablet 1  . meloxicam (MOBIC) 7.5 MG tablet Take 7.5 mg by mouth daily. (Patient not taking: No sig reported)    . mupirocin ointment (BACTROBAN) 2 % Apply 1 application topically 2 (two) times daily. Apply to corner of mouth 22 g 0  . pimecrolimus (ELIDEL) 1 % cream Apply topically.    . Testosterone (ANDROGEL PUMP) 20.25 MG/ACT (1.62%) GEL Apply 1 pump on each shoulder after shower daily 75 g 3  . triamcinolone ointment (KENALOG) 0.1 % Apply 1 application topically 2 (two) times daily as needed (Rash). Use for up to 2 weeks on affected areas of arms  Avoid applying to face, groin, and axilla. Use as directed. 60 g 1   No current facility-administered medications for this visit.     Psychiatric Specialty Exam: Review of Systems  There were no vitals taken for this visit.There is no height or weight on file to calculate BMI.  General Appearance: Fairly groomed  Eye Contact: Good  Speech:  Clear and Coherent and Normal Rate  Volume:  Normal  Mood:  Less Depressed  Affect:  Congruent  Thought Process:  Goal Directed, Linear and Descriptions of Associations: Intact  Orientation:  Full (Time, Place, and Person)  Thought Content: Logical   Suicidal Thoughts:  No  Homicidal Thoughts:  No  Memory:  Recent;   Good Remote;   Good  Judgement:  Good  Insight:  Good  Psychomotor Activity:  Normal   Concentration:  Concentration: Good and Attention Span: Good  Recall:  Good  Fund of Knowledge: Good  Language: Good  Akathisia:  Negative  Handed:  Right  AIMS (if indicated): not done  Assets:  Communication Skills Desire for Improvement Financial Resources/Insurance Housing  ADL's:  Intact  Cognition: WNL  Sleep:  Fair    Screenings: PHQ-9 and C-SSRS   Assessment and Plan: Patient has noticed improvement in his depressive symptoms after being started on Latuda to augment Celexa.  However his insurance agency is refusing to provide coverage and therefore patient wants to try something different.  Patient was offered the option of trying Rexulti, Probation officer informed him that we can provide him with samples for a few weeks and he can see how he does on it.  Patient was agreeable to this plan.    1. MDD (major depressive disorder), recurrent, in partial remission (H[facility])  - Continue Celexa 40 mg daily. -Complete Latuda 20 mg with meal after 1 week and then start Rexulti 0.5 mg once a day for 2 weeks and then take 1 mg once a day.   Continue individual therapy with Ms. Lady Deutscher. Follow-up in 6-7 weeks.  [place] Crane, MD 05/04/2020, 3:48 PM

## 2020-05-05 ENCOUNTER — Encounter: Payer: Self-pay | Admitting: Dermatology

## 2020-05-12 ENCOUNTER — Other Ambulatory Visit: Payer: Self-pay

## 2020-05-12 ENCOUNTER — Ambulatory Visit (INDEPENDENT_AMBULATORY_CARE_PROVIDER_SITE_OTHER): Payer: 59 | Admitting: Podiatry

## 2020-05-12 ENCOUNTER — Encounter: Payer: Self-pay | Admitting: Podiatry

## 2020-05-12 DIAGNOSIS — M2170 Unequal limb length (acquired), unspecified site: Secondary | ICD-10-CM

## 2020-05-12 DIAGNOSIS — M722 Plantar fascial fibromatosis: Secondary | ICD-10-CM

## 2020-05-12 NOTE — Progress Notes (Signed)
Subjective:  Patient ID: Kevin Ramos, male    DOB: 04/26/1995,  MRN: [id number]  Chief Complaint  Patient presents with  . Foot Pain    Patient says he had right hip replacement in 10/2019 and since then, his right foot feels funny, has tightness across midfoot and burning in right heel.  He wants to discuss if orthotics need to be adjusted    25 y.o. male presents with the above complaint.  Patient presents with new complaint of right foot plantar fasciitis as well as a new concern for limb length discrepancy after patient had a hip replacement done on 2019-11-13.  Patient states he is healing well from the hip replacement however he is getting more tightness across the midfoot and more burning in the heel since afterwards.  He wants to know if the orthotics are doing anything or can be adjusted.  He denies any other acute complaints.   Review of Systems: Negative except as noted in the HPI. Denies N/V/F/Ch.  Past Medical History:  Diagnosis Date  . Acne   . Depression   . Dysplastic nevus 02/09/2019   right upper back, moderate atypia, close to margin.    Current Outpatient Medications:  .  azelastine (ASTELIN) 0.1 % nasal spray, Place into the nose., Disp: , Rfl:  .  calcium carbonate (OSCAL) 1500 (600 Ca) MG TABS tablet, Take by mouth., Disp: , Rfl:  .  gabapentin (NEURONTIN) 300 MG capsule, Take by mouth., Disp: , Rfl:  .  propranolol (INDERAL) 10 MG tablet, , Disp: , Rfl:  .  traZODone (DESYREL) 50 MG tablet, , Disp: , Rfl:  .  acetaminophen (TYLENOL) 500 MG tablet, Take 500 mg by mouth every 6 (six) hours as needed., Disp: , Rfl:  .  ALPRAZolam (XANAX) 0.5 MG tablet, Take 0.5 mg by mouth at bedtime as needed for anxiety., Disp: , Rfl:  .  brexpiprazole (REXULTI) 1 MG TABS tablet, Take 1 tablet (1 mg total) by mouth daily., Disp: 30 tablet, Rfl: 1 .  calcitRIOL (ROCALTROL) 0.5 MCG capsule, TAKE TWO CAPSULES BY MOUTH DAILY, Disp: 180 capsule, Rfl: 2 .  Calcium  Carbonate-Vit D-Min (CALCIUM 1200 PO), Take 2 capsules by mouth daily. , Disp: , Rfl:  .  cetirizine (ZYRTEC) 10 MG tablet, Take by mouth., Disp: , Rfl:  .  Cholecalciferol (VITAMIN D) 50 MCG (2000 UT) tablet, Take 2,000 Units by mouth daily., Disp: , Rfl:  .  citalopram (CELEXA) 40 MG tablet, Take 1 tablet (40 mg total) by mouth daily., Disp: 90 tablet, Rfl: 1 .  clindamycin (CLEOCIN T) 1 % external solution, Apply topically in the morning and at bedtime. scalp, Disp: 30 mL, Rfl: 1 .  DESCOVY 200-25 MG tablet, Take 1 tablet by mouth daily. (Patient not taking: No sig reported), Disp: , Rfl:  .  doxepin (SINEQUAN) 10 MG capsule, Take 1 to 2 capsules at bedtime as needed for sleep, Disp: 180 capsule, Rfl: 1 .  fluticasone (FLONASE) 50 MCG/ACT nasal spray, Place into the nose., Disp: , Rfl:  .  hydrocortisone 2.5 % ointment, Apply to corner of mouth twice daily for up to 1 week., Disp: 30 g, Rfl: 0 .  ibuprofen (ADVIL) 200 MG tablet, Take 200 mg by mouth every 6 (six) hours as needed., Disp: , Rfl:  .  ketoconazole (NIZORAL) 2 % cream, Apply 1 application topically in the morning and at bedtime. Apply to corner of mouth, Disp: 15 g, Rfl: 0 .  levothyroxine (SYNTHROID)  75 MCG tablet, TAKE ONE TABLET BY MOUTH DAILY BEFORE BREAKFAST, Disp: 90 tablet, Rfl: 0 .  mupirocin ointment (BACTROBAN) 2 %, Apply 1 application topically 2 (two) times daily. Apply to corner of mouth, Disp: 22 g, Rfl: 0 .  naproxen (NAPROSYN) 500 MG tablet, Take 500 mg by mouth 2 (two) times daily., Disp: , Rfl:  .  nystatin (MYCOSTATIN) 100000 UNIT/ML suspension, Take by mouth., Disp: , Rfl:  .  Testosterone (ANDROGEL PUMP) 20.25 MG/ACT (1.62%) GEL, Apply 1 pump on each shoulder after shower daily, Disp: 75 g, Rfl: 3 .  triamcinolone ointment (KENALOG) 0.1 %, Apply 1 application topically 2 (two) times daily as needed (Rash). Use for up to 2 weeks on affected areas of arms  Avoid applying to face, groin, and axilla. Use as directed.,  Disp: 60 g, Rfl: 1  Social History   Tobacco Use  Smoking Status Never Smoker  Smokeless Tobacco Never Used    No Known Allergies Objective:  There were no vitals filed for this visit. There is no height or weight on file to calculate BMI. Constitutional Well developed. Well nourished.  Vascular Dorsalis pedis pulses palpable bilaterally. Posterior tibial pulses palpable bilaterally. Capillary refill normal to all digits.  No cyanosis or clubbing noted. Pedal hair growth normal.  Neurologic Normal speech. Oriented to person, place, and time. Epicritic sensation to light touch grossly present bilaterally.  Dermatologic Nails well groomed and normal in appearance. No open wounds. No skin lesions.  Orthopedic:  Very mild pain on palpation to the right heel calcaneal tuber.  Generalized pain in the arch of the foot as well.  All the pain is on the right side.  Negative Tinel's sign.  No pain at the Achilles, posterior tibial, ATFL ligament.  Evaluation of both limb shows that patient may have about half inch limb length discrepancy approximately.   Radiographs: None Assessment:   1. Acquired unequal limb length   2. Plantar fasciitis, right    Plan:  Patient was evaluated and treated and all questions answered.  Right plantar fasciitis and ligament discrepancy with right side longer than left side -I explained to the patient the etiology of the pain that he is having and given that this is mild to moderate in nature I believe patient may benefit from some adjustments to the orthotics.  The etiology of most of his pain may be due to the new hip surgery as he was doing really well before the hip surgery.  However the orthotics have not been molded to his new foot.  At this time we can consider either redoing the orthotics or making adjustments to the orthotics.  He would benefit from seeing Liliane Channel to make adjustments.  We may also need to incorporate limited discrepancy as well I will  asked with 2 measuring for limb length discrepancy  No follow-ups on file.

## 2020-05-16 ENCOUNTER — Other Ambulatory Visit: Payer: Self-pay | Admitting: *Deleted

## 2020-05-16 MED ORDER — LEVOTHYROXINE SODIUM 75 MCG PO TABS
ORAL_TABLET | ORAL | 0 refills | Status: DC
Start: 2020-05-16 — End: 2020-08-11

## 2020-05-18 ENCOUNTER — Other Ambulatory Visit: Payer: Self-pay

## 2020-05-18 ENCOUNTER — Other Ambulatory Visit: Payer: 59

## 2020-05-18 ENCOUNTER — Ambulatory Visit: Payer: 59 | Admitting: Endocrinology

## 2020-05-18 ENCOUNTER — Encounter: Payer: PRIVATE HEALTH INSURANCE | Admitting: Endocrinology

## 2020-05-18 ENCOUNTER — Other Ambulatory Visit (INDEPENDENT_AMBULATORY_CARE_PROVIDER_SITE_OTHER): Payer: 59

## 2020-05-18 ENCOUNTER — Ambulatory Visit: Payer: 59 | Admitting: Orthotics

## 2020-05-18 ENCOUNTER — Encounter: Payer: Self-pay | Admitting: Endocrinology

## 2020-05-18 ENCOUNTER — Telehealth: Payer: Self-pay

## 2020-05-18 VITALS — BP 110/80 | HR 84 | Ht 68.0 in | Wt 182.0 lb

## 2020-05-18 DIAGNOSIS — E230 Hypopituitarism: Secondary | ICD-10-CM

## 2020-05-18 DIAGNOSIS — E201 Pseudohypoparathyroidism: Secondary | ICD-10-CM

## 2020-05-18 DIAGNOSIS — E038 Other specified hypothyroidism: Secondary | ICD-10-CM

## 2020-05-18 DIAGNOSIS — M722 Plantar fascial fibromatosis: Secondary | ICD-10-CM

## 2020-05-18 DIAGNOSIS — R202 Paresthesia of skin: Secondary | ICD-10-CM | POA: Diagnosis not present

## 2020-05-18 DIAGNOSIS — M2141 Flat foot [pes planus] (acquired), right foot: Secondary | ICD-10-CM

## 2020-05-18 DIAGNOSIS — M2142 Flat foot [pes planus] (acquired), left foot: Secondary | ICD-10-CM

## 2020-05-18 DIAGNOSIS — M2170 Unequal limb length (acquired), unspecified site: Secondary | ICD-10-CM

## 2020-05-18 LAB — TSH: TSH: 2.24 u[IU]/mL (ref 0.35–4.50)

## 2020-05-18 LAB — T4, FREE: Free T4: 0.91 ng/dL (ref 0.60–1.60)

## 2020-05-18 LAB — BASIC METABOLIC PANEL
BUN: 8 mg/dL (ref 6–23)
CO2: 31 mEq/L (ref 19–32)
Calcium: 9.4 mg/dL (ref 8.4–10.5)
Chloride: 100 mEq/L (ref 96–112)
Creatinine, Ser: 0.94 mg/dL (ref 0.40–1.50)
GFR: 111.65 mL/min (ref >60.00)
Glucose, Bld: 85 mg/dL (ref 70–99)
Potassium: 3.6 mEq/L (ref 3.5–5.1)
Sodium: 139 mEq/L (ref 135–145)

## 2020-05-18 LAB — TESTOSTERONE: Testosterone: 426.08 ng/dL (ref 300.00–890.00)

## 2020-05-18 LAB — VITAMIN B12: Vitamin B-12: 279 pg/mL (ref 211–911)

## 2020-05-18 NOTE — Progress Notes (Signed)
Patient ID: Kevin Ramos, male   DOB: 11/09/1995, 25 y.o.   MRN: [id number]             Chief complaint: Endocrinology follow-up  History of Present Illness:  Problem 1: HYPOCALCEMIA  RECENT history:  Since 4/21 he has been on 0.5 mcg of calcitriol, previously had required larger doses Also takes 1 tablets of 1200 mg calcium twice a day and has been regular with days  Usually has complaints of tingling in the hands or face cramping or stiffness associated with low calcium levels   About 3 weeks ago he had complained of tingling in the hands and was empirically told to take extra calcitriol and calcium tablets He does not think this helped much and he still complaining of tingling in his hands and feet and occasional numbness  Labs are pending from today, calcium was normal about 3 weeks ago     Lab Results  Component Value Date   CALCIUM 9.2 04/27/2020   CALCIUM 8.5 (L) 02/18/2020   CALCIUM 9.3 02/16/2020    VITAMIN D deficiency: He is taking vitamin D3, 2000 units daily   Lab Results  Component Value Date   VD25OH 20.83 (L) 03/25/2019   VD25OH 30.7 10/29/2017   VD25OH 41.22 12/27/2016   HYPOTHYROIDISM:  He had thyroid functions checked because of fatigue in 12/20 Free T4 was low normal at 0.63 as of 2/21 With his TSH is mildly increased he was told to start taking levothyroxine 25 mcg daily  Previously had symptoms of fatigue and sleepiness  With his TSH also being consistently mildly increased he was told to go up to 75 mcg levothyroxine He has been trying to take this before breakfast daily  No complaints of fatigue recently   Lab Results  Component Value Date   TSH 4.25 12/14/2019   TSH 2.65 08/19/2019   TSH 3.55 06/30/2019   FREET4 0.77 02/16/2020   FREET4 0.72 12/14/2019   FREET4 0.91 08/19/2019   Problem 2: HYPOGONADISM:  Because of his significant fatigue, lack of energy and motivation testosterone was checked and free testosterone was  low on 2 occasions LH normal along with prolactin  Previously was consistently using his 4 mg Androderm Subsequently complained that the patches were causing some skin irritation and rough texture at the point of applying this without any itching or obvious redness Androgen was sent as this was supposed to be approved by his insurance but this is still undergoing prior authorization  Does not have any increased fatigue lately  Has had excellent levels previously when applying  the patch regularly  Lab Results  Component Value Date   TESTOSTERONE 196.61 (L) 02/16/2020   TESTOSTERONE 239.04 (L) 12/14/2019   TESTOSTERONE 445.12 08/19/2019   TESTOSTERONE 577.3 06/30/2019   TESTOSTERONE 253 (L) 05/05/2019   Lab Results  Component Value Date   TESTOFREE 6.7 (L) 05/05/2019   TESTOFREE 7.5 (L) 04/30/2019      Past Medical History:  Diagnosis Date  . Acne   . Depression   . Dysplastic nevus 02/09/2019   right upper back, moderate atypia, close to margin.    No past surgical history on file.  Family History  Problem Relation Age of Onset  . Hypoparathyroidism Mother     Social History:  reports that he has never smoked. He has never used smokeless tobacco. He reports that he does not drink alcohol and does not use drugs.   Allergies: No Known Allergies  Allergies as of  05/18/2020   No Known Allergies     Medication List       Accurate as of May 18, 2020  9:17 AM. If you have any questions, ask your nurse or doctor.        acetaminophen 500 MG tablet Commonly known as: TYLENOL Take 500 mg by mouth every 6 (six) hours as needed.   ALPRAZolam 0.5 MG tablet Commonly known as: XANAX Take 0.5 mg by mouth at bedtime as needed for anxiety.   azelastine 0.1 % nasal spray Commonly known as: ASTELIN Place into the nose.   brexpiprazole 1 MG Tabs tablet Commonly known as: Rexulti Take 1 tablet (1 mg total) by mouth daily.   calcitRIOL 0.5 MCG capsule Commonly  known as: ROCALTROL TAKE TWO CAPSULES BY MOUTH DAILY   CALCIUM 1200 PO Take 2 capsules by mouth daily.   calcium carbonate 1500 (600 Ca) MG Tabs tablet Commonly known as: OSCAL Take by mouth.   cetirizine 10 MG tablet Commonly known as: ZYRTEC Take by mouth.   citalopram 40 MG tablet Commonly known as: CELEXA Take 1 tablet (40 mg total) by mouth daily.   clindamycin 1 % external solution Commonly known as: CLEOCIN T Apply topically in the morning and at bedtime. scalp   Descovy 200-25 MG tablet Generic drug: emtricitabine-tenofovir AF Take 1 tablet by mouth daily.   doxepin 10 MG capsule Commonly known as: SINEQUAN Take 1 to 2 capsules at bedtime as needed for sleep   fluticasone 50 MCG/ACT nasal spray Commonly known as: FLONASE Place into the nose.   gabapentin 300 MG capsule Commonly known as: NEURONTIN Take by mouth.   hydrocortisone 2.5 % ointment Apply to corner of mouth twice daily for up to 1 week.   ibuprofen 200 MG tablet Commonly known as: ADVIL Take 200 mg by mouth every 6 (six) hours as needed.   ketoconazole 2 % cream Commonly known as: NIZORAL Apply 1 application topically in the morning and at bedtime. Apply to corner of mouth   levothyroxine 75 MCG tablet Commonly known as: SYNTHROID TAKE ONE TABLET BY MOUTH DAILY BEFORE BREAKFAST   mupirocin ointment 2 % Commonly known as: BACTROBAN Apply 1 application topically 2 (two) times daily. Apply to corner of mouth   naproxen 500 MG tablet Commonly known as: NAPROSYN Take 500 mg by mouth 2 (two) times daily.   nystatin 100000 UNIT/ML suspension Commonly known as: MYCOSTATIN Take by mouth.   propranolol 10 MG tablet Commonly known as: INDERAL   Testosterone 20.25 MG/ACT (1.62%) Gel Commonly known as: AndroGel Pump Apply 1 pump on each shoulder after shower daily   traZODone 50 MG tablet Commonly known as: DESYREL   triamcinolone ointment 0.1 % Commonly known as: KENALOG Apply 1  application topically 2 (two) times daily as needed (Rash). Use for up to 2 weeks on affected areas of arms  Avoid applying to face, groin, and axilla. Use as directed.   Vitamin D 50 MCG (2000 UT) tablet Take 2,000 Units by mouth daily.           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 Previously adequately replaced with 4 mg Androderm He is still waiting for prior authorization for AndroGel The last 2 testosterone levels have been consistently low when he was not able to get his medication consistently  PLAN:   Doses of calcium, calcitriol and levothyroxine to be decided based on labs We will check with his insurance about the status of the prior authorization for the AndroGel which is reportedly covered Gaynelle Cage is currently not covered  B12 to be checked since he is having paresthesiae Otherwise we will need to have him follow-up with PCP regarding the  symptoms   Elayne Snare 05/18/2020, 9:17 AM

## 2020-05-18 NOTE — Progress Notes (Signed)
Lowered arch by excvating material and added poron patch to heel.

## 2020-05-18 NOTE — Telephone Encounter (Signed)
Previous fax from [employer] stated AndroGel was covered. We will need to call them, thanks

## 2020-05-18 NOTE — Addendum Note (Signed)
Addended by: Kaylyn Lim I on: 05/18/2020 11:58 AM   Modules accepted: Orders

## 2020-05-18 NOTE — Telephone Encounter (Signed)
Spoke with [facility] about patients rx for testosterone gel. They stated PA was sent but is still pending after they ran it through insurance again.

## 2020-05-20 NOTE — Progress Notes (Signed)
Testosterone level is back to normal and we can hold off on starting the AndroGel for now.  Also calcium and thyroid levels are excellent.  Please confirm how much calcitriol patient is taking

## 2020-05-21 NOTE — Telephone Encounter (Signed)
[employer] letter---stated needed additional info. By 05/22/2020. Faxed chart notes [phone]

## 2020-05-23 ENCOUNTER — Telehealth (HOSPITAL_COMMUNITY): Payer: Self-pay | Admitting: *Deleted

## 2020-05-23 NOTE — Telephone Encounter (Signed)
Per Provider due to patient noted that he may be [place] : [place]  FOR brexpiprazole (REXULTI) 1 MG TABS tablet HAS BEEN PLACED ON HOLD

## 2020-05-26 NOTE — Progress Notes (Signed)
This encounter was created in error - please disregard.

## 2020-05-27 ENCOUNTER — Telehealth: Payer: Self-pay | Admitting: Endocrinology

## 2020-05-27 NOTE — Telephone Encounter (Signed)
Please advise 

## 2020-05-27 NOTE — Telephone Encounter (Signed)
Patient requests to be called at ph# [phone] re: Patient still has approx. 40 testosterone patches left and would like to know if he should continue using them until they run out.

## 2020-05-27 NOTE — Telephone Encounter (Signed)
Please clarify that he had not been using the patches including when he came for his office visit last month.  His testosterone levels normal last month, presumably without being on the medication

## 2020-05-28 NOTE — Telephone Encounter (Signed)
Pt verified using the patch and pick up the Rx testosterone  gel to the pharmacy. Pt questioning if needed to stop taking the testosterone. Please advise

## 2020-05-28 NOTE — Telephone Encounter (Signed)
It is not clear whether he was using the patch when he had his testosterone level checked on his last lab visit.  If he was taking the patch at that time he can continue using it now as long as he can tolerate the skin reaction

## 2020-05-30 ENCOUNTER — Other Ambulatory Visit: Payer: Self-pay | Admitting: Endocrinology

## 2020-05-30 ENCOUNTER — Telehealth (HOSPITAL_COMMUNITY): Payer: Self-pay | Admitting: *Deleted

## 2020-05-30 MED ORDER — TESTOSTERONE 20.25 MG/ACT (1.62%) TD GEL: TRANSDERMAL | 2 refills | Status: DC

## 2020-05-30 NOTE — Telephone Encounter (Signed)
Fax from his insurance indicates that testosterone gel 1.62% is covered and this will be sent again

## 2020-05-30 NOTE — Telephone Encounter (Signed)
Call from patient checking in to see what next step is re his medicine since the [place] was not approved per his insurance. He states he felt ok on it but not as good as he felt when he was taking the latuda that also wasn't approved per his insurance co. He said he was doing some research and it looks like Seroquel and Lithium would be on his approved list if Dr Toy Care was in agreement to trying them. He reports he does not have a move out of state date, he had hoped for it to be April, but he was recently diagnosed with fibramyalgia and he would like to be stabilized on that first before relocating. He does not want to continue to be on medicines he is reliant on samples for . Will bring his concern to Dr Malachy Moan attention for further directions.

## 2020-05-30 NOTE — Telephone Encounter (Signed)
Pt stated been using the patches especially on the last OV. Pt agreed to use the rest of the patch

## 2020-05-31 NOTE — Telephone Encounter (Signed)
All right, if the pt is agreeable we can continue to provide him with samples of either Latuda or rexulti until he moves. Please contact him and ask if he wants to do that.

## 2020-06-02 ENCOUNTER — Telehealth (HOSPITAL_COMMUNITY): Payer: Self-pay | Admitting: *Deleted

## 2020-06-02 MED ORDER — QUETIAPINE FUMARATE 50 MG PO TABS: 50.0000 mg | ORAL_TABLET | Freq: Every day | ORAL | 1 refills | Status: DC

## 2020-06-02 NOTE — Telephone Encounter (Signed)
Call from [facility] to follow up on our last conversation re trying another medicine since his insurance wont pay for the rexulti and wouldn't pay for the [place] either. He would like to try something else rather than be dependent on samples. He wont be moving till after April, no set date. Will ask Dr Toy Care about a different med for him, he thinks either Nicoletta Dress or Seroquel might work because he did some research and he thinks his insurance will pay for that.

## 2020-06-02 NOTE — Addendum Note (Signed)
Addended by: [place] Crane on: 06/02/2020 02:25 PM   Modules accepted: Orders

## 2020-06-02 NOTE — Telephone Encounter (Signed)
Spoke with Denyse Amass re starting a trial of Seroquel. He would like to try it and asked we call rx to his preferred pharmacy which [facility] in [place]

## 2020-06-02 NOTE — Telephone Encounter (Signed)
We can do a trial of Seroquel 50 mg HS which will also be helpful for sleep. However, it is associated with increase in appetite. If he is agreeable to try that I can send a Rx for that.

## 2020-06-02 NOTE — Telephone Encounter (Signed)
Rx sent to his preferred pharmacy.

## 2020-06-22 ENCOUNTER — Encounter (HOSPITAL_COMMUNITY): Payer: Self-pay | Admitting: Psychiatry

## 2020-06-22 ENCOUNTER — Telehealth (INDEPENDENT_AMBULATORY_CARE_PROVIDER_SITE_OTHER): Payer: 59 | Admitting: Psychiatry

## 2020-06-22 ENCOUNTER — Other Ambulatory Visit: Payer: Self-pay

## 2020-06-22 DIAGNOSIS — F3342 Major depressive disorder, recurrent, in full remission: Secondary | ICD-10-CM | POA: Diagnosis not present

## 2020-06-22 DIAGNOSIS — F900 Attention-deficit hyperactivity disorder, predominantly inattentive type: Secondary | ICD-10-CM | POA: Insufficient documentation

## 2020-06-22 MED ORDER — LISDEXAMFETAMINE DIMESYLATE 40 MG PO CAPS: 40.0000 mg | ORAL_CAPSULE | ORAL | 0 refills | Status: DC

## 2020-06-22 MED ORDER — QUETIAPINE FUMARATE 50 MG PO TABS: 50.0000 mg | ORAL_TABLET | Freq: Every day | ORAL | 1 refills | Status: DC

## 2020-06-22 MED ORDER — DOXEPIN HCL 10 MG PO CAPS
ORAL_CAPSULE | ORAL | 1 refills | Status: DC
Start: 2020-06-22 — End: 2020-09-07

## 2020-06-22 MED ORDER — CITALOPRAM HYDROBROMIDE 40 MG PO TABS
40.0000 mg | ORAL_TABLET | Freq: Every day | ORAL | 1 refills | Status: DC
Start: 2020-06-22 — End: 2020-07-25

## 2020-06-22 NOTE — Progress Notes (Signed)
[place] MD/PA/NP OP Progress Note  Virtual Visit via Video Note  I connected with Kevin Ramos on 06/22/20 at  2:20 PM EDT by a video enabled telemedicine application and verified that I am speaking with the correct person using two identifiers.  Location: Patient: Home Provider: Clinic   I discussed the limitations of evaluation and management by telemedicine and the availability of in person appointments. The patient expressed understanding and agreed to proceed.  I provided 16 minutes of non-face-to-face time during this encounter.    06/22/2020 4:00 PM Kevin Ramos  MRN:  [id number]  Chief Complaint: " I like Seroquel."  HPI: Patient had contacted the clinic a few weeks ago to report that he found the Latuda to be very helpful however since his insurance would not cover it he would like to try something different.  He was given samples for Rexulti which he thought was not too helpful.  However that also was not converted by his insurance.  Based on that he contacted the clinic again and reported that he found out from his insurance that they would cover Seroquel or lithium.  He stated that he would like to try Seroquel. A prescription for Seroquel 50 mg at bedtime was sent to the pharmacy a few weeks ago. Today, patient reported that he feels Seroquel is helpful.  He is sleeping a lot better.  His mood is stable however he still feels that we do not work even better than this. He stated that he has been seeing his therapist regularly and recently was discussing with her about the fact that he just lays around in the house and does not do much. He informed that he has history of ADHD and used to take Adderall about 8 years ago in college.  He stated that he wants to try something similar to Adderall because Adderall made him very energetic. Writer asked him if he would like to try Vyvanse which is also amphetamine-based medication like Adderall. Patient verbalized his  agreement to try it. He informed that he is staying local until mid May and then after that he will move back to [place].  He has a few more important doctors appointments that he is to attend before he moves back and that is why he has postponed his moving date from April to May.   Visit Diagnosis:    ICD-10-CM   1. MDD (major depressive disorder), recurrent, in full remission ([place])  F33.42 citalopram (CELEXA) 40 MG tablet    doxepin (SINEQUAN) 10 MG capsule    QUEtiapine (SEROQUEL) 50 MG tablet  2. Attention deficit hyperactivity disorder (ADHD), predominantly inattentive type  F90.0 lisdexamfetamine (VYVANSE) 40 MG capsule    lisdexamfetamine (VYVANSE) 40 MG capsule    Past Psychiatric History: MDD  Past Medical History:  Past Medical History:  Diagnosis Date  . Acne   . Depression   . Dysplastic nevus 02/09/2019   right upper back, moderate atypia, close to margin.   No past surgical history on file.  Family Psychiatric History: denied  Family History:  Family History  Problem Relation Age of Onset  . Hypoparathyroidism Mother     Social History:  Social History   Socioeconomic History  . Marital status: Single    Spouse name: Not on file  . Number of children: Not on file  . Years of education: Not on file  . Highest education level: Not on file  Occupational History  . Not on file  Tobacco  Use  . Smoking status: Never Smoker  . Smokeless tobacco: Never Used  Substance and Sexual Activity  . Alcohol use: Never  . Drug use: No  . Sexual activity: Not on file  Other Topics Concern  . Not on file  Social History Narrative  . Not on file   Social Determinants of Health   Financial Resource Strain: Not on file  Food Insecurity: Not on file  Transportation Needs: Not on file  Physical Activity: Not on file  Stress: Not on file  Social Connections: Not on file    Allergies: No Known Allergies  Metabolic Disorder Labs: Lab Results  Component Value  Date   HGBA1C 4.9 10/29/2017   MPG 93.93 10/29/2017   Lab Results  Component Value Date   PROLACTIN 14.3 06/30/2019   PROLACTIN 17.9 (H) 05/05/2019   Lab Results  Component Value Date   CHOL 162 10/29/2017   TRIG 192 (H) 10/29/2017   HDL 35 (L) 10/29/2017   CHOLHDL 4.6 10/29/2017   VLDL 38 10/29/2017   LDLCALC 89 10/29/2017   Lab Results  Component Value Date   TSH 2.24 05/18/2020   TSH 4.25 12/14/2019    Therapeutic Level Labs: No results found for: LITHIUM No results found for: VALPROATE No components found for:  CBMZ  Current Medications: Current Outpatient Medications  Medication Sig Dispense Refill  . lisdexamfetamine (VYVANSE) 40 MG capsule Take 1 capsule (40 mg total) by mouth every morning. 30 capsule 0  . [START ON 07/22/2020] lisdexamfetamine (VYVANSE) 40 MG capsule Take 1 capsule (40 mg total) by mouth every morning. 30 capsule 0  . acetaminophen (TYLENOL) 500 MG tablet Take 500 mg by mouth every 6 (six) hours as needed.    Marland Kitchen azelastine (ASTELIN) 0.1 % nasal spray Place into the nose.    . calcitRIOL (ROCALTROL) 0.5 MCG capsule TAKE TWO CAPSULES BY MOUTH DAILY 180 capsule 2  . calcium carbonate (OSCAL) 1500 (600 Ca) MG TABS tablet Take by mouth.    . Calcium Carbonate-Vit D-Min (CALCIUM 1200 PO) Take 2 capsules by mouth daily.     . cetirizine (ZYRTEC) 10 MG tablet Take by mouth.    . Cholecalciferol (VITAMIN D) 50 MCG (2000 UT) tablet Take 2,000 Units by mouth daily.    . citalopram (CELEXA) 40 MG tablet Take 1 tablet (40 mg total) by mouth daily. 90 tablet 1  . clindamycin (CLEOCIN T) 1 % external solution Apply topically in the morning and at bedtime. scalp 30 mL 1  . DESCOVY 200-25 MG tablet Take 1 tablet by mouth daily.    Marland Kitchen doxepin (SINEQUAN) 10 MG capsule Take 1 to 2 capsules at bedtime as needed for sleep 180 capsule 1  . fluticasone (FLONASE) 50 MCG/ACT nasal spray Place into the nose.    . gabapentin (NEURONTIN) 300 MG capsule Take by mouth.    .  hydrocortisone 2.5 % ointment Apply to corner of mouth twice daily for up to 1 week. 30 g 0  . ibuprofen (ADVIL) 200 MG tablet Take 200 mg by mouth every 6 (six) hours as needed.    Marland Kitchen levothyroxine (SYNTHROID) 75 MCG tablet TAKE ONE TABLET BY MOUTH DAILY BEFORE BREAKFAST 90 tablet 0  . mupirocin ointment (BACTROBAN) 2 % Apply 1 application topically 2 (two) times daily. Apply to corner of mouth 22 g 0  . naproxen (NAPROSYN) 500 MG tablet Take 500 mg by mouth 2 (two) times daily.    Marland Kitchen nystatin (MYCOSTATIN) 100000 UNIT/ML suspension Take  by mouth.    . propranolol (INDERAL) 10 MG tablet     . QUEtiapine (SEROQUEL) 50 MG tablet Take 1 tablet (50 mg total) by mouth at bedtime. 30 tablet 1  . Testosterone (ANDROGEL PUMP) 20.25 MG/ACT (1.62%) GEL Apply 1 pump on each shoulder daily after shower 75 g 2  . triamcinolone ointment (KENALOG) 0.1 % Apply 1 application topically 2 (two) times daily as needed (Rash). Use for up to 2 weeks on affected areas of arms  Avoid applying to face, groin, and axilla. Use as directed. 60 g 1   No current facility-administered medications for this visit.     Psychiatric Specialty Exam: Review of Systems  There were no vitals taken for this visit.There is no height or weight on file to calculate BMI.  General Appearance: Fairly groomed  Eye Contact: Good  Speech:  Clear and Coherent and Normal Rate  Volume:  Normal  Mood:  Euthymic  Affect:  Congruent  Thought Process:  Goal Directed, Linear and Descriptions of Associations: Intact  Orientation:  Full (Time, Place, and Person)  Thought Content: Logical   Suicidal Thoughts:  No  Homicidal Thoughts:  No  Memory:  Recent;   Good Remote;   Good  Judgement:  Good  Insight:  Good  Psychomotor Activity:  Normal  Concentration:  Concentration: Good and Attention Span: Good  Recall:  Good  Fund of Knowledge: Good  Language: Good  Akathisia:  Negative  Handed:  Right  AIMS (if indicated): not done  Assets:   Communication Skills Desire for Improvement Financial Resources/Insurance Housing  ADL's:  Intact  Cognition: WNL  Sleep:  Good    Screenings:    Assessment and Plan: Patient reported remission of his depression symptoms after Seroquel was added to his regimen.  He still takes 1 tablet of doxepin 10 mg at bedtime along with Seroquel and is sleeping well. He requested to be started on something to help him with his ADHD symptoms.  He has taken Adderall XR in the past and would like to try something similar, was recommended a trial of Vyvanse. Potential side effects of medication and risks vs benefits of treatment vs non-treatment were explained and discussed. All questions were answered.    1. MDD (major depressive disorder), recurrent, in full remission ([place])  -Continue citalopram (CELEXA) 40 MG tablet; Take 1 tablet (40 mg total) by mouth daily.  Dispense: 90 tablet; Refill: 1 -Continue doxepin (SINEQUAN) 10 MG capsule; Take 1 to 2 capsules at bedtime as needed for sleep  Dispense: 180 capsule; Refill: 1 -  QUEtiapine (SEROQUEL) 50 MG tablet; Take 1 tablet (50 mg total) by mouth at bedtime.  Dispense: 30 tablet; Refill: 1  2. Attention deficit hyperactivity disorder (ADHD), predominantly inattentive type  - Start lisdexamfetamine (VYVANSE) 40 MG capsule; Take 1 capsule (40 mg total) by mouth every morning.  Dispense: 30 capsule; Refill: 0 - lisdexamfetamine (VYVANSE) 40 MG capsule; Take 1 capsule (40 mg total) by mouth every morning.  Dispense: 30 capsule; Refill: 0   Continue individual therapy with Ms. Lady Deutscher. Follow-up in 6 weeks.  [place] Crane, MD 06/22/2020, 4:00 PM

## 2020-06-29 ENCOUNTER — Telehealth (HOSPITAL_COMMUNITY): Payer: Self-pay | Admitting: *Deleted

## 2020-06-29 MED ORDER — LISDEXAMFETAMINE DIMESYLATE 20 MG PO CAPS: 20.0000 mg | ORAL_CAPSULE | Freq: Every day | ORAL | 0 refills | Status: DC

## 2020-06-29 NOTE — Telephone Encounter (Signed)
Called Kevin Ramos back with instructions re the med change to 20 mg. He said he would try it and if didn't feet better on the lesser dose he would call back next week.

## 2020-06-29 NOTE — Addendum Note (Signed)
Addended by: [place] Crane on: 06/29/2020 12:06 PM   Modules accepted: Orders

## 2020-06-29 NOTE — Telephone Encounter (Signed)
Call from patient stating he was recently started on Vyvanse and would like the Dr to consider a lower dose. He is feeling "wired" having difficulty sleeping, and has a decreased appetite. Will bring his concern to the provider.

## 2020-06-29 NOTE — Telephone Encounter (Signed)
New Rx for Vyvanse 20 mg sent to his preferred pharmacy

## 2020-07-06 ENCOUNTER — Telehealth (HOSPITAL_COMMUNITY): Payer: Self-pay | Admitting: *Deleted

## 2020-07-06 NOTE — Telephone Encounter (Signed)
VM left on writers phone from patient asking if his Vyvanse rx can be cut down again. Initially he was given 40 mg and asked it to be reduced and it was to 20 mg, asking it be reduced again as he is still having difficulty sleeping and eating. Informed him Dr Toy Care was out of the office till Monday, would let her know on her return and follow up then.

## 2020-07-10 NOTE — Telephone Encounter (Signed)
This concern will be addressed at the time of his next scheduled appointment with the writer.

## 2020-07-11 ENCOUNTER — Telehealth (HOSPITAL_COMMUNITY): Payer: Self-pay | Admitting: Psychiatry

## 2020-07-11 MED ORDER — LISDEXAMFETAMINE DIMESYLATE 20 MG PO CAPS: 20.0000 mg | ORAL_CAPSULE | Freq: Every morning | ORAL | 0 refills | Status: DC

## 2020-07-11 NOTE — Telephone Encounter (Signed)
Patient contacted the office for a second time, concerning medications. He has stopped his Vyvanse as he feels that it is too high of a dosage. He was told that this concern would be handled at next appt, but does not want to be unmedicated until then as he has stopped taking Vyvanse because of side effects. Patient was requesting to have a lower dosage sent to pharmacy.

## 2020-07-11 NOTE — Telephone Encounter (Signed)
Prescription for Vyvanse 20 mg sent to his pharmacy.

## 2020-07-13 ENCOUNTER — Telehealth (HOSPITAL_COMMUNITY): Payer: Self-pay | Admitting: *Deleted

## 2020-07-13 MED ORDER — AMPHETAMINE-DEXTROAMPHETAMINE 5 MG PO TABS: 5.0000 mg | ORAL_TABLET | Freq: Every day | ORAL | 0 refills | Status: DC

## 2020-07-13 NOTE — Telephone Encounter (Signed)
VM left for writer stating he has been without his Vyvanse for a week, and doesn't want to have to wait till mid May when he has an appt with Dr Toy Care for his Vyvanse dose to be possibly decreased from the 20 mg he is taking. States he is still having difficulty sleeping and doesn't have much of an appetite and is thin to begin with. The Vyvanse is a capsule and cant be broken. Will bring this concern to Dr Malachy Moan attention.

## 2020-07-13 NOTE — Telephone Encounter (Addendum)
Okay, if 20 mg dose Vyvanse is too strong for him then the other option is to try Adderall.

## 2020-07-13 NOTE — Telephone Encounter (Signed)
Spoke with Jayin and he is onboard to try Adderall as long as its the lowest dose.

## 2020-07-13 NOTE — Telephone Encounter (Signed)
Called him to make him aware the Dr called in a rx for adderall for him. He will pick it up tonight and start it tomorrow.

## 2020-07-13 NOTE — Addendum Note (Signed)
Addended by: [place] Crane on: 07/13/2020 02:56 PM   Modules accepted: Orders

## 2020-07-13 NOTE — Telephone Encounter (Signed)
Rx for Adderall 5 mg sent to his pharmacy

## 2020-07-15 ENCOUNTER — Telehealth (HOSPITAL_COMMUNITY): Payer: Self-pay | Admitting: *Deleted

## 2020-07-15 NOTE — Telephone Encounter (Signed)
There is nothing sooner, he will have to hold till his appt on the 11 th

## 2020-07-15 NOTE — Telephone Encounter (Signed)
It is very difficult to keep addressing these issues with out conducting a proper psychiatric assessment of the patient. Please communicate with the front desk staff to see if there is any appointment available for him to be seen sooner than his scheduled appointment on May 11.

## 2020-07-15 NOTE — Telephone Encounter (Signed)
Call from patient stating he is having a lot of pain related to his new diagnosis of fibramyalgia and his Dr suggested he be on Cymbalta but concerned about him being on it and Celexa. Wants Dr Toy Care to weigh in on it and then see if she will prescribe it or the Dr treating his fibramyalgia. Will bring this concern to Dr Malachy Moan attention.

## 2020-07-18 ENCOUNTER — Telehealth (HOSPITAL_COMMUNITY): Payer: Self-pay | Admitting: *Deleted

## 2020-07-18 NOTE — Telephone Encounter (Signed)
Called patient back as promised to follow up with his concern about changing his antidepressant per his neurologists recommendation to help manage his fibramyalgia. Dr Toy Care would prefer to see him at this point rather than continue to make med changes without seeing him. Scheduled with her for 5/2 at 220 as a virtual appt as she just had a patient cancel for that time slot. Called [place] and he accepted the appt and no further changes till seen.

## 2020-07-20 ENCOUNTER — Other Ambulatory Visit: Payer: Self-pay | Admitting: Endocrinology

## 2020-07-20 ENCOUNTER — Encounter: Payer: Self-pay | Admitting: Endocrinology

## 2020-07-20 ENCOUNTER — Ambulatory Visit: Payer: 59 | Admitting: Endocrinology

## 2020-07-20 ENCOUNTER — Other Ambulatory Visit: Payer: Self-pay

## 2020-07-20 VITALS — BP 120/78 | HR 78 | Ht 68.0 in | Wt 181.6 lb

## 2020-07-20 DIAGNOSIS — E230 Hypopituitarism: Secondary | ICD-10-CM

## 2020-07-20 DIAGNOSIS — E201 Pseudohypoparathyroidism: Secondary | ICD-10-CM | POA: Diagnosis not present

## 2020-07-20 DIAGNOSIS — E038 Other specified hypothyroidism: Secondary | ICD-10-CM | POA: Diagnosis not present

## 2020-07-20 LAB — BASIC METABOLIC PANEL
BUN: 11 mg/dL (ref 6–23)
CO2: 28 mEq/L (ref 19–32)
Calcium: 9.4 mg/dL (ref 8.4–10.5)
Chloride: 100 mEq/L (ref 96–112)
Creatinine, Ser: 0.98 mg/dL (ref 0.40–1.50)
GFR: 106.07 mL/min (ref >60.00)
Glucose, Bld: 87 mg/dL (ref 70–99)
Potassium: 3.3 mEq/L — ABNORMAL LOW (ref 3.5–5.1)
Sodium: 140 mEq/L (ref 135–145)

## 2020-07-20 LAB — TESTOSTERONE: Testosterone: 194.35 ng/dL — ABNORMAL LOW (ref 300.00–890.00)

## 2020-07-20 LAB — T4, FREE: Free T4: 0.91 ng/dL (ref 0.60–1.60)

## 2020-07-20 LAB — TSH: TSH: 1.92 u[IU]/mL (ref 0.35–4.50)

## 2020-07-20 MED ORDER — POTASSIUM CHLORIDE CRYS ER 20 MEQ PO TBCR: 20.0000 meq | EXTENDED_RELEASE_TABLET | Freq: Every day | ORAL | 3 refills | Status: DC

## 2020-07-20 NOTE — Progress Notes (Signed)
Patient ID: Kevin Ramos, male   DOB: [birth year], 25 y.o.   MRN: [id number]             Chief complaint: Endocrinology follow-up  History of Present Illness:  Problem 1: HYPOCALCEMIA  RECENT history:  Since 4/21 he has been on 0.5 mcg of calcitriol, previously had required larger doses Also takes 1 tablets of 1200 mg calcium twice a day and has been regular with days  Usually has complaints of tingling in the hands or face cramping or stiffness associated with low calcium levels   He has still complaining of intermittent tingling in his hands and feet and also numbness. No twitching or cramping  Labs are pending from today, calcium was normal about 8 weeks ago     Lab Results  Component Value Date   CALCIUM 9.4 05/18/2020   CALCIUM 9.2 04/27/2020   CALCIUM 8.5 (L) 02/18/2020    VITAMIN D deficiency: He is taking vitamin D3, 2000 units daily   Lab Results  Component Value Date   VD25OH 20.83 (L) 03/25/2019   VD25OH 30.7 10/29/2017   VD25OH 41.22 12/27/2016   HYPOTHYROIDISM:  He had thyroid functions checked because of fatigue in 12/20 Free T4 was low normal at 0.63 as of 2/21 With his TSH is mildly increased he was told to start taking levothyroxine 25 mcg daily  Previously had symptoms of fatigue and sleepiness  He is now on 75 mcg levothyroxine since 9/21 He has been able to take this before breakfast daily  He does have some fatigue which is not worse than before   Lab Results  Component Value Date   TSH 2.24 05/18/2020   TSH 4.25 12/14/2019   TSH 2.65 08/19/2019   FREET4 0.91 05/18/2020   FREET4 0.77 02/16/2020   FREET4 0.72 12/14/2019   Problem 2: HYPOGONADISM:  Because of his significant fatigue, lack of energy and motivation testosterone was checked and free testosterone was low on 2 occasions LH normal along with prolactin  Previously was consistently using his 4 mg Androderm and was able to continue despite some scaling irritation He  was switched to AndroGel but he misunderstood the instructions and has not started this as yet He has been out of his AndroGel for about 3 weeks  Does not have any increased fatigue lately, still has some tiredness related to other issues  Has had normal testosterone levels previously when applying  the patch regularly  Lab Results  Component Value Date   TESTOSTERONE 426.08 05/18/2020   TESTOSTERONE 196.61 (L) 02/16/2020   TESTOSTERONE 239.04 (L) 12/14/2019   TESTOSTERONE 445.12 08/19/2019   TESTOSTERONE 577.3 06/30/2019   Lab Results  Component Value Date   TESTOFREE 6.7 (L) 05/05/2019   TESTOFREE 7.5 (L) 04/30/2019      Past Medical History:  Diagnosis Date  . Acne   . Depression   . Dysplastic nevus 02/09/2019   right upper back, moderate atypia, close to margin.    No past surgical history on file.  Family History  Problem Relation Age of Onset  . Hypoparathyroidism Mother     Social History:  reports that he has never smoked. He has never used smokeless tobacco. He reports that he does not drink alcohol and does not use drugs.   Allergies: No Known Allergies  Allergies as of 07/20/2020   No Known Allergies     Medication List       Accurate as of July 20, 2020  8:53 AM. If  you have any questions, ask your nurse or doctor.        acetaminophen 500 MG tablet Commonly known as: TYLENOL Take 500 mg by mouth every 6 (six) hours as needed.   amphetamine-dextroamphetamine 5 MG tablet Commonly known as: Adderall Take 1 tablet (5 mg total) by mouth daily.   azelastine 0.1 % nasal spray Commonly known as: ASTELIN Place into the nose.   calcitRIOL 0.5 MCG capsule Commonly known as: ROCALTROL TAKE TWO CAPSULES BY MOUTH DAILY   CALCIUM 1200 PO Take 2 capsules by mouth daily.   calcium carbonate 1500 (600 Ca) MG Tabs tablet Commonly known as: OSCAL Take by mouth.   cetirizine 10 MG tablet Commonly known as: ZYRTEC Take by mouth.   citalopram 40  MG tablet Commonly known as: CELEXA Take 1 tablet (40 mg total) by mouth daily.   clindamycin 1 % external solution Commonly known as: CLEOCIN T Apply topically in the morning and at bedtime. scalp   Descovy 200-25 MG tablet Generic drug: emtricitabine-tenofovir AF Take 1 tablet by mouth daily.   doxepin 10 MG capsule Commonly known as: SINEQUAN Take 1 to 2 capsules at bedtime as needed for sleep   fluticasone 50 MCG/ACT nasal spray Commonly known as: FLONASE Place into the nose.   gabapentin 300 MG capsule Commonly known as: NEURONTIN Take by mouth.   hydrocortisone 2.5 % ointment Apply to corner of mouth twice daily for up to 1 week.   ibuprofen 200 MG tablet Commonly known as: ADVIL Take 200 mg by mouth every 6 (six) hours as needed.   levothyroxine 75 MCG tablet Commonly known as: SYNTHROID TAKE ONE TABLET BY MOUTH DAILY BEFORE BREAKFAST   lisdexamfetamine 20 MG capsule Commonly known as: Vyvanse Take 1 capsule (20 mg total) by mouth daily.   lisdexamfetamine 20 MG capsule Commonly known as: Vyvanse Take 1 capsule (20 mg total) by mouth in the morning.   mupirocin ointment 2 % Commonly known as: BACTROBAN Apply 1 application topically 2 (two) times daily. Apply to corner of mouth   naproxen 500 MG tablet Commonly known as: NAPROSYN Take 500 mg by mouth 2 (two) times daily.   nystatin 100000 UNIT/ML suspension Commonly known as: MYCOSTATIN Take by mouth.   propranolol 10 MG tablet Commonly known as: INDERAL   QUEtiapine 50 MG tablet Commonly known as: SEROQUEL Take 1 tablet (50 mg total) by mouth at bedtime.   Testosterone 20.25 MG/ACT (1.62%) Gel Commonly known as: AndroGel Pump Apply 1 pump on each shoulder daily after shower   triamcinolone ointment 0.1 % Commonly known as: KENALOG Apply 1 application topically 2 (two) times daily as needed (Rash). Use for up to 2 weeks on affected areas of arms  Avoid applying to face, groin, and axilla. Use  as directed.   Vitamin D 50 MCG (2000 UT) tablet Take 2,000 Units by mouth daily.           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

## 2020-07-25 ENCOUNTER — Encounter (HOSPITAL_COMMUNITY): Payer: Self-pay | Admitting: Psychiatry

## 2020-07-25 ENCOUNTER — Telehealth (INDEPENDENT_AMBULATORY_CARE_PROVIDER_SITE_OTHER): Payer: 59 | Admitting: Psychiatry

## 2020-07-25 ENCOUNTER — Other Ambulatory Visit: Payer: Self-pay

## 2020-07-25 DIAGNOSIS — F3341 Major depressive disorder, recurrent, in partial remission: Secondary | ICD-10-CM

## 2020-07-25 DIAGNOSIS — F900 Attention-deficit hyperactivity disorder, predominantly inattentive type: Secondary | ICD-10-CM

## 2020-07-25 DIAGNOSIS — M797 Fibromyalgia: Secondary | ICD-10-CM | POA: Diagnosis not present

## 2020-07-25 MED ORDER — DULOXETINE HCL 30 MG PO CPEP: 30.0000 mg | ORAL_CAPSULE | Freq: Two times a day (BID) | ORAL | 1 refills | Status: DC

## 2020-07-25 MED ORDER — QUETIAPINE FUMARATE 50 MG PO TABS: 50.0000 mg | ORAL_TABLET | Freq: Every day | ORAL | 1 refills | Status: DC

## 2020-07-25 MED ORDER — AMPHETAMINE-DEXTROAMPHETAMINE 5 MG PO TABS: 5.0000 mg | ORAL_TABLET | Freq: Every day | ORAL | 0 refills | Status: DC

## 2020-07-25 NOTE — Progress Notes (Signed)
[facility] MD/PA/NP OP Progress Note  Virtual Visit via Telephone Note  I connected with Kevin Ramos on 07/25/20 at  2:20 PM EDT by telephone and verified that I am speaking with the correct person using two identifiers.  Location: Patient: home Provider: Clinic   I discussed the limitations, risks, security and privacy concerns of performing an evaluation and management service by telephone and the availability of in person appointments. I also discussed with the patient that there may be a patient responsible charge related to this service. The patient expressed understanding and agreed to proceed.   I provided 18 minutes of non-face-to-face time during this encounter.    07/25/2020 2:38 PM Kevin Ramos  MRN:  [id number]  Chief Complaint: "I would like to try Cymbalta."  HPI: Patient is connected the clinic several times since his last appointment with the writer.  Initially he had contacted the clinic to report that the dose of Vyvanse 40 mg was too strong for him.  He will try lower dose. The dose was lowered to 20 mg however despite the reduction in the dose he continued to complain of dose being too strong and him not being able to sleep and also having poor appetite. Vyvanse was not eventually discontinued and replaced by Adderall 5 mg daily. Last week patient called to report that he spoke with his PCP and they are recommending trial of Cymbalta due to his fibromyalgia-like symptoms. The PCP wanted the patient to speak to the writer to make this decision as he was taking Celexa 40 mg daily for his depression symptoms at that time. Writer recommended that patient's appointment is rescheduled for an earlier date so that also can be discussed during the session for further recommendations.  Today, patient reported that his depression symptoms are in remission however he is dealing with a lot of pain.  He stated that he continues to have pain that starts from his neck area  and then radiates to his shoulders and down his back.  He stated that he is in very severe pain on some days.  He had seen a rheumatologist in [place] in March and the other ones were diagnosed with fibromyalgia.  He stated that at that time he was taking gabapentin that was prescribed by his surgeon at at the dose of 300 mg 3 times daily. He stated that after he saw the PCP a few days ago he stated that his symptoms concur with fibromyalgia and that he will benefit from a trial of Cymbalta.  However since he was taking Celexa 40 mg prescribed by the writer they wanted the patient to be discussing this with the Probation officer first. Today, Probation officer asked if the patient is agreeable to trying Cymbalta as it does have evidence supporting its use and fibromyalgia symptoms, patient replied yes.  Writer recommended starting Cymbalta 30 mg daily once a day for a week and then going up to 1 tablet twice a day based on his response. Patient was agreeable to try that.  Regarding Adderall, patient stated that he feels Adderall is much helpful.  He stated that Vyvanse was causing him to have significantly poor appetite and also was keeping him up at night.  Writer asked him to elaborate the benefits he was getting from low-dose of Adderall at present, he replied that he feels he can focus better and in a way helps him to stay calm.  He Stated That He Feels More Structured and Organized. Writer asked if he thinks  he needs a higher dose, he reported that he feels the current dose is working well as long as he takes it before 11:30 AM.  He stated that if he takes it too late that he is not able to sleep at night.  He still taking Seroquel at bedtime which is helpful with his mood and sleep, he also takes doxepin 1 to 2 capsules at bedtime as needed.  He stated that he is hoping that he can get his pain under control.  Writer noted that he has been seeing other specialist as well and he was recently diagnosed with low testosterone  levels and is currently on testosterone replacement therapy. Patient reported that he is scheduled to see his PCP again in a few days and he is going to discuss with them about the possibility of continuing gabapentin 300 mg 3 times daily as he is scared to discontinue it. He informed that his mother also has similar conditions as him and she takes gabapentin at a higher dose which helps her immensely.   Visit Diagnosis:    ICD-10-CM   1. MDD (major depressive disorder), recurrent, in partial remission (HCC)  F33.41 DULoxetine (CYMBALTA) 30 MG capsule    QUEtiapine (SEROQUEL) 50 MG tablet  2. Attention deficit hyperactivity disorder (ADHD), predominantly inattentive type  F90.0 amphetamine-dextroamphetamine (ADDERALL) 5 MG tablet    amphetamine-dextroamphetamine (ADDERALL) 5 MG tablet  3. Fibromyalgia  M79.7     Past Psychiatric History: MDD  Past Medical History:  Past Medical History:  Diagnosis Date  . Acne   . Depression   . Dysplastic nevus 02/09/2019   right upper back, moderate atypia, close to margin.   History reviewed. No pertinent surgical history.  Family Psychiatric History: denied  Family History:  Family History  Problem Relation Age of Onset  . Hypoparathyroidism Mother     Social History:  Social History   Socioeconomic History  . Marital status: Single    Spouse name: Not on file  . Number of children: Not on file  . Years of education: Not on file  . Highest education level: Not on file  Occupational History  . Not on file  Tobacco Use  . Smoking status: Never Smoker  . Smokeless tobacco: Never Used  Substance and Sexual Activity  . Alcohol use: Never  . Drug use: No  . Sexual activity: Not on file  Other Topics Concern  . Not on file  Social History Narrative  . Not on file   Social Determinants of Health   Financial Resource Strain: Not on file  Food Insecurity: Not on file  Transportation Needs: Not on file  Physical Activity: Not on  file  Stress: Not on file  Social Connections: Not on file    Allergies: No Known Allergies  Metabolic Disorder Labs: Lab Results  Component Value Date   HGBA1C 4.9 10/29/2017   MPG 93.93 10/29/2017   Lab Results  Component Value Date   PROLACTIN 14.3 06/30/2019   PROLACTIN 17.9 (H) 05/05/2019   Lab Results  Component Value Date   CHOL 162 10/29/2017   TRIG 192 (H) 10/29/2017   HDL 35 (L) 10/29/2017   CHOLHDL 4.6 10/29/2017   VLDL 38 10/29/2017   LDLCALC 89 10/29/2017   Lab Results  Component Value Date   TSH 1.92 07/20/2020   TSH 2.24 05/18/2020    Therapeutic Level Labs: No results found for: LITHIUM No results found for: VALPROATE No components found for:  CBMZ  Current  Medications: Current Outpatient Medications  Medication Sig Dispense Refill  . [START ON 08/24/2020] amphetamine-dextroamphetamine (ADDERALL) 5 MG tablet Take 1 tablet (5 mg total) by mouth daily. 30 tablet 0  . DULoxetine (CYMBALTA) 30 MG capsule Take 1 capsule (30 mg total) by mouth 2 (two) times daily. 60 capsule 1  . acetaminophen (TYLENOL) 500 MG tablet Take 500 mg by mouth every 6 (six) hours as needed.    Marland Kitchen amphetamine-dextroamphetamine (ADDERALL) 5 MG tablet Take 1 tablet (5 mg total) by mouth daily. 30 tablet 0  . azelastine (ASTELIN) 0.1 % nasal spray Place into the nose.    . calcitRIOL (ROCALTROL) 0.5 MCG capsule TAKE TWO CAPSULES BY MOUTH DAILY 180 capsule 2  . calcium carbonate (OSCAL) 1500 (600 Ca) MG TABS tablet Take by mouth.    . Calcium Carbonate-Vit D-Min (CALCIUM 1200 PO) Take 2 capsules by mouth daily.     . cetirizine (ZYRTEC) 10 MG tablet Take by mouth.    . Cholecalciferol (VITAMIN D) 50 MCG (2000 UT) tablet Take 2,000 Units by mouth daily.    . clindamycin (CLEOCIN T) 1 % external solution Apply topically in the morning and at bedtime. scalp 30 mL 1  . DESCOVY 200-25 MG tablet Take 1 tablet by mouth daily.    Marland Kitchen doxepin (SINEQUAN) 10 MG capsule Take 1 to 2 capsules at  bedtime as needed for sleep 180 capsule 1  . fluticasone (FLONASE) 50 MCG/ACT nasal spray Place into the nose.    . gabapentin (NEURONTIN) 300 MG capsule Take by mouth.    . hydrocortisone 2.5 % ointment Apply to corner of mouth twice daily for up to 1 week. 30 g 0  . ibuprofen (ADVIL) 200 MG tablet Take 200 mg by mouth every 6 (six) hours as needed.    Marland Kitchen levothyroxine (SYNTHROID) 75 MCG tablet TAKE ONE TABLET BY MOUTH DAILY BEFORE BREAKFAST 90 tablet 0  . mupirocin ointment (BACTROBAN) 2 % Apply 1 application topically 2 (two) times daily. Apply to corner of mouth 22 g 0  . nystatin (MYCOSTATIN) 100000 UNIT/ML suspension Take by mouth.    . potassium chloride SA (KLOR-CON) 20 MEQ tablet Take 1 tablet (20 mEq total) by mouth daily. 30 tablet 3  . propranolol (INDERAL) 10 MG tablet     . QUEtiapine (SEROQUEL) 50 MG tablet Take 1 tablet (50 mg total) by mouth at bedtime. 30 tablet 1  . Testosterone (ANDROGEL PUMP) 20.25 MG/ACT (1.62%) GEL Apply 1 pump on each shoulder daily after shower (Patient not taking: Reported on 07/20/2020) 75 g 2  . triamcinolone ointment (KENALOG) 0.1 % Apply 1 application topically 2 (two) times daily as needed (Rash). Use for up to 2 weeks on affected areas of arms  Avoid applying to face, groin, and axilla. Use as directed. 60 g 1   No current facility-administered medications for this visit.     Psychiatric Specialty Exam: Review of Systems  There were no vitals taken for this visit.There is no height or weight on file to calculate BMI.  General Appearance: unable to assess due to phone visit  Eye Contact: unable to assess due to phone visit  Speech:  Clear and Coherent and Normal Rate  Volume:  Normal  Mood:  Euthymic  Affect:  Congruent  Thought Process:  Goal Directed, Linear and Descriptions of Associations: Intact  Orientation:  Full (Time, Place, and Person)  Thought Content: Logical   Suicidal Thoughts:  No  Homicidal Thoughts:  No  Memory:  Recent;    Good Remote;   Good  Judgement:  Good  Insight:  Good  Psychomotor Activity:  Normal  Concentration:  Concentration: Good and Attention Span: Good  Recall:  Good  Fund of Knowledge: Good  Language: Good  Akathisia:  Negative  Handed:  Right  AIMS (if indicated): not done  Assets:  Communication Skills Desire for Improvement Financial Resources/Insurance Housing  ADL's:  Intact  Cognition: WNL  Sleep:  Good      Assessment and Plan: Patient is really concerned about his chronic pain issues.  He stated that he was seen by rheumatology at [facility] in March and was informed that he has fibromyalgia.  He stated that he really wants to try something different like Cymbalta to help with his fibromyalgia symptoms.  Writer explained to him that Celexa can be discontinued after 24-hour washout.  He can start Cymbalta 30 mg once a day for a week followed by 1 capsule twice a day.  Patient also found Adderall to be very helpful for his attention issues and would like to continue the same dose for now.  1. MDD (major depressive disorder), recurrent, in partial remission ([facility])  -Start DULoxetine (CYMBALTA) 30 MG capsule; Take 1 capsule (30 mg total) by mouth 2 (two) times daily.  Dispense: 60 capsule; Refill: 1 -Discontinue Celexa and try Cymbalta due to chronic pain issues. -Continue QUEtiapine (SEROQUEL) 50 MG tablet; Take 1 tablet (50 mg total) by mouth at bedtime.  Dispense: 30 tablet; Refill: 1 - Continue doxepin 10 to 20 mg at bedtime as needed for sleep.  2. Attention deficit hyperactivity disorder (ADHD), predominantly inattentive type  -Continue amphetamine-dextroamphetamine (ADDERALL) 5 MG tablet; Take 1 tablet (5 mg total) by mouth daily.  Dispense: 30 tablet; Refill: 0 - amphetamine-dextroamphetamine (ADDERALL) 5 MG tablet; Take 1 tablet (5 mg total) by mouth daily.  Dispense: 30 tablet; Refill: 0  3.  Fibromyalgia Start DULoxetine (CYMBALTA) 30 MG capsule; Take 1 capsule (30 mg  total) by mouth 2 (two) times daily.  Dispense: 60 capsule; Refill: 1   Continue individual therapy with Ms. Lady Deutscher. Follow-up in 6 weeks.  [place] Crane, MD 07/25/2020, 2:38 PM

## 2020-08-03 ENCOUNTER — Telehealth (HOSPITAL_COMMUNITY): Payer: 59 | Admitting: Psychiatry

## 2020-08-11 ENCOUNTER — Other Ambulatory Visit: Payer: Self-pay | Admitting: Endocrinology

## 2020-08-15 ENCOUNTER — Emergency Department
Admission: EM | Admit: 2020-08-15 | Discharge: 2020-08-15 | Disposition: A | Discharge status: Home / Self Care | Payer: 59 | Attending: Emergency Medicine | Admitting: Emergency Medicine

## 2020-08-15 ENCOUNTER — Other Ambulatory Visit: Payer: Self-pay

## 2020-08-15 DIAGNOSIS — Z96641 Presence of right artificial hip joint: Secondary | ICD-10-CM | POA: Diagnosis not present

## 2020-08-15 DIAGNOSIS — E039 Hypothyroidism, unspecified: Secondary | ICD-10-CM | POA: Insufficient documentation

## 2020-08-15 DIAGNOSIS — Y93G1 Activity, food preparation and clean up: Secondary | ICD-10-CM | POA: Insufficient documentation

## 2020-08-15 DIAGNOSIS — Z23 Encounter for immunization: Secondary | ICD-10-CM | POA: Diagnosis not present

## 2020-08-15 DIAGNOSIS — Y280XXA Contact with sharp glass, undetermined intent, initial encounter: Secondary | ICD-10-CM | POA: Insufficient documentation

## 2020-08-15 DIAGNOSIS — Z79899 Other long term (current) drug therapy: Secondary | ICD-10-CM | POA: Diagnosis not present

## 2020-08-15 DIAGNOSIS — S61012A Laceration without foreign body of left thumb without damage to nail, initial encounter: Secondary | ICD-10-CM

## 2020-08-15 MED ORDER — ACETAMINOPHEN 325 MG PO TABS
ORAL_TABLET | ORAL | Status: AC
  Administered 2020-08-15: 650 mg via ORAL
  Filled 2020-08-15: qty 2

## 2020-08-15 MED ORDER — TETANUS-DIPHTH-ACELL PERTUSSIS 5-2.5-18.5 LF-MCG/0.5 IM SUSY
0.5000 mL | PREFILLED_SYRINGE | Freq: Once | INTRAMUSCULAR | Status: AC
  Administered 2020-08-15: 0.5 mL via INTRAMUSCULAR
  Filled 2020-08-15: qty 0.5

## 2020-08-15 MED ORDER — LIDOCAINE HCL (PF) 1 % IJ SOLN
10.0000 mL | Freq: Once | INTRAMUSCULAR | Status: AC
  Administered 2020-08-15: 10 mL
  Filled 2020-08-15: qty 10

## 2020-08-15 MED ORDER — ACETAMINOPHEN 500 MG PO TABS: 1000.0000 mg | ORAL_TABLET | Freq: Once | ORAL | Status: DC

## 2020-08-15 MED ORDER — CEPHALEXIN 500 MG PO CAPS: 1000.0000 mg | ORAL_CAPSULE | Freq: Two times a day (BID) | ORAL | 0 refills | Status: DC

## 2020-08-15 MED ORDER — ACETAMINOPHEN 325 MG PO TABS: 650.0000 mg | ORAL_TABLET | Freq: Once | ORAL | Status: AC

## 2020-08-15 NOTE — ED Triage Notes (Addendum)
Pt with lac to left thumb after glass broke while washing dishes. Lac noted with small amt bleeding, pressure bandage applied.

## 2020-08-15 NOTE — ED Provider Notes (Signed)
[facility] Emergency Department Provider Note  ____________________________________________  Time seen: Approximately 11:22 PM  I have reviewed the triage vital signs and the nursing notes.   HISTORY  Chief Complaint Laceration    HPI Kevin Ramos is a 25 y.o. male who presents the emergency department complaining of a laceration to the left thumb.  Patient was cleaning some dishes this evening went to dishes collided breaking causing a laceration to his thumb.  Patient sustained a flap style laceration that had some difficulty in controlling the bleeding.  It is currently controlled.  Full range of motion is intact.  Patient denies any other complaint or injury.  Unsure of his last tetanus shot.         Past Medical History:  Diagnosis Date  . Acne   . Depression   . Dysplastic nevus 02/09/2019   right upper back, moderate atypia, close to margin.    Patient Active Problem List   Diagnosis Date Noted  . MDD (major depressive disorder), recurrent, in partial remission ([place]) 07/25/2020  . Fibromyalgia 07/25/2020  . Attention deficit hyperactivity disorder (ADHD), predominantly inattentive type 06/22/2020  . Status post hip replacement, right 12/11/2019  . Hypokalemia 11/23/2019  . Hypertrophy of nasal turbinates 10/12/2019  . Deviated nasal septum 10/12/2019  . Osteoarthritis of right hip 10/10/2019  . Legg-Calve-Perthes disease, right 09/22/2019  . Non-seasonal allergic rhinitis 09/21/2019  . Sacroiliitis ([place]) 07/23/2019  . Acute hip pain, bilateral 07/23/2019  . MDD (major depressive disorder), recurrent, in full remission ([place]) 07/22/2019  . Dyspnea on exertion 07/02/2019  . Low testosterone in male 06/30/2019  . Vitamin D deficiency 06/16/2019  . Acquired hypothyroidism 06/16/2019  . Anxiety 05/08/2018  . 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 Noncompliance 10/30/2017  . Major depressive disorder,  recurrent severe without psychotic features ([place]) 10/29/2017  . Suicidal ideation 10/29/2017  . 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 Acne 08/10/2011  . Anxiety and depression 08/10/2011  . Osteopenia 08/10/2011    No past surgical history on file.  Prior to Admission medications   Medication Sig Start Date End Date Taking? Authorizing Provider  cephALEXin (KEFLEX) 500 MG capsule Take 2 capsules (1,000 mg total) by mouth 2 (two) times daily. 08/15/20  Yes Keylin Podolsky, Charline Bills, PA-C  acetaminophen (TYLENOL) 500 MG tablet Take 500 mg by mouth every 6 (six) hours as needed.    [provider]  amphetamine-dextroamphetamine (ADDERALL) 5 MG tablet Take 1 tablet (5 mg total) by mouth daily. 07/25/20 07/25/21  [place] Crane, MD  amphetamine-dextroamphetamine (ADDERALL) 5 MG tablet Take 1 tablet (5 mg total) by mouth daily. 08/24/20   [place] Crane, MD  azelastine (ASTELIN) 0.1 % nasal spray Place into the nose. 12/16/16   [provider]  calcitRIOL (ROCALTROL) 0.5 MCG capsule TAKE TWO CAPSULES BY MOUTH DAILY 12/15/19   Elayne Snare, MD  calcium carbonate (OSCAL) 1500 (600 Ca) MG TABS tablet Take by mouth. 01/14/18   [provider]  Calcium Carbonate-Vit D-Min (CALCIUM 1200 PO) Take 2 capsules by mouth daily.     [provider]  cetirizine (ZYRTEC) 10 MG tablet Take by mouth.    [provider]  Cholecalciferol (VITAMIN D) 50 MCG (2000 UT) tablet Take 2,000 Units by mouth daily.    [provider]  clindamycin (CLEOCIN T) 1 % external solution Apply topically in the morning and at bedtime. scalp 04/14/20 04/14/21  Alfonso Patten, MD  DESCOVY 200-25 MG  tablet Take 1 tablet by mouth daily. 09/09/18   [provider]  doxepin (SINEQUAN) 10 MG capsule Take 1 to 2 capsules at bedtime as needed for sleep 06/22/20   Zena Amos, MD  DULoxetine (CYMBALTA) 30 MG capsule Take 1 capsule (30 mg total) by mouth 2 (two) times  daily. 07/25/20   Zena Amos, MD  fluticasone (FLONASE) 50 MCG/ACT nasal spray Place into the nose.    [provider]  gabapentin (NEURONTIN) 300 MG capsule Take by mouth. 04/22/20 04/22/21  [provider]  hydrocortisone 2.5 % ointment Apply to corner of mouth twice daily for up to 1 week. 04/19/20   [place], [place], MD  ibuprofen (ADVIL) 200 MG tablet Take 200 mg by mouth every 6 (six) hours as needed.    [provider]  levothyroxine (SYNTHROID) 75 MCG tablet TAKE ONE TABLET BY MOUTH EVERY MORNING BEFORE BREAKFAST 08/11/20   Reather Littler, MD  mupirocin ointment (BACTROBAN) 2 % Apply 1 application topically 2 (two) times daily. Apply to corner of mouth 04/14/20   [facility], [place], MD  nystatin (MYCOSTATIN) 100000 UNIT/ML suspension Take by mouth. 02/23/20   [provider]  potassium chloride SA (KLOR-CON) 20 MEQ tablet Take 1 tablet (20 mEq total) by mouth daily. 07/20/20   Reather Littler, MD  propranolol (INDERAL) 10 MG tablet  11/17/16   [provider]  QUEtiapine (SEROQUEL) 50 MG tablet Take 1 tablet (50 mg total) by mouth at bedtime. 07/25/20   Zena Amos, MD  Testosterone (ANDROGEL PUMP) 20.25 MG/ACT (1.62%) GEL Apply 1 pump on each shoulder daily after shower Patient not taking: Reported on 07/20/2020 05/30/20   Reather Littler, MD  triamcinolone ointment (KENALOG) 0.1 % Apply 1 application topically 2 (two) times daily as needed (Rash). Use for up to 2 weeks on affected areas of arms  Avoid applying to face, groin, and axilla. Use as directed. 04/14/20   Sandi Mealy, MD    Allergies Abilify [aripiprazole]  Family History  Problem Relation Age of Onset  . Hypoparathyroidism Mother     Social History Social History   Tobacco Use  . Smoking status: Never Smoker  . Smokeless tobacco: Never Used  Substance Use Topics  . Alcohol use: Never  . Drug use: No     Review of Systems  Constitutional: No fever/chills Eyes: No visual changes. No  discharge ENT: No upper respiratory complaints. Cardiovascular: no chest pain. Respiratory: no cough. No SOB. Gastrointestinal: No abdominal pain.  No nausea, no vomiting.  No diarrhea.  No constipation. Musculoskeletal: Positive for laceration to the left thumb Skin: Negative for rash, abrasions, lacerations, ecchymosis. Neurological: Negative for headaches, focal weakness or numbness.  10 System ROS otherwise negative.  ____________________________________________   PHYSICAL EXAM:  VITAL SIGNS: ED Triage Vitals  Enc Vitals Group     BP 08/15/20 1958 (!) 130/95     Pulse Rate 08/15/20 1958 97     Resp 08/15/20 1958 18     Temp 08/15/20 1958 98.6 F (37 C)     Temp Source 08/15/20 1958 Oral     SpO2 08/15/20 1958 97 %     Weight 08/15/20 1956 180 lb (81.6 kg)     Height 08/15/20 1956 5\' 8"  (1.727 m)     Head Circumference --      Peak Flow --      Pain Score 08/15/20 1956 10     Pain Loc --      Pain Edu? --  Excl. in [place]? --      Constitutional: Alert and oriented. Well appearing and in no acute distress. Eyes: Conjunctivae are normal. PERRL. EOMI. Head: Atraumatic. ENT:      Ears:       Nose: No congestion/rhinnorhea.      Mouth/Throat: Mucous membranes are moist.  Neck: No stridor.    Cardiovascular: Normal rate, regular rhythm. Normal S1 and S2.  Good peripheral circulation. Respiratory: Normal respiratory effort without tachypnea or retractions. Lungs CTAB. Good air entry to the bases with no decreased or absent breath sounds. Musculoskeletal: Full range of motion to all extremities. No gross deformities appreciated.  Visualization of the left arm reveals a flap style laceration measuring approximately 2 cm along the edge with some active bleeding.  This is nonpulsatile.  Full range of motion to the digit.  Laceration lies along the lateral aspect of the thumb over the distal phalanx.  No involvement of the nailbed.  Sensation intact.  Capillary refill less than  2 seconds. Neurologic:  Normal speech and language. No gross focal neurologic deficits are appreciated.  Skin:  Skin is warm, dry and intact. No rash noted. Psychiatric: Mood and affect are normal. Speech and behavior are normal. Patient exhibits appropriate insight and judgement.   ____________________________________________   LABS (all labs ordered are listed, but only abnormal results are displayed)  Labs Reviewed - No data to display ____________________________________________  EKG   ____________________________________________  RADIOLOGY   No results found.  ____________________________________________    PROCEDURES  Procedure(s) performed:    Marland KitchenMarland KitchenLaceration Repair  Date/Time: 08/15/2020 11:26 PM Performed by: Darletta Moll, PA-C Authorized by: Darletta Moll, PA-C   Consent:    Consent obtained:  Verbal   Consent given by:  Patient   Risks, benefits, and alternatives were discussed: yes     Risks discussed:  Infection and pain Universal protocol:    Procedure explained and questions answered to patient or proxy's satisfaction: yes     Immediately prior to procedure, a time out was called: yes     Patient identity confirmed:  Verbally with patient Anesthesia:    Anesthesia method:  Nerve block   Block location:  L thumb   Block needle gauge:  27 G   Block anesthetic:  Lidocaine 1% w/o epi   Block technique:  Digital block   Block injection procedure:  Anatomic landmarks identified, introduced needle, negative aspiration for blood and incremental injection   Block outcome:  Anesthesia achieved Laceration details:    Location:  Finger   Finger location:  L thumb   Length (cm):  2 Exploration:    Limited defect created (wound extended): no     Hemostasis achieved with:  Direct pressure   Imaging outcome: foreign body not noted     Wound exploration: wound explored through full range of motion and entire depth of wound visualized      Wound extent: no foreign bodies/material noted, no nerve damage noted, no tendon damage noted and no underlying fracture noted     Contaminated: no   Treatment:    Area cleansed with:  Povidone-iodine and saline   Amount of cleaning:  Standard   Irrigation solution:  Sterile saline   Irrigation volume:  568ml   Irrigation method:  Syringe Skin repair:    Repair method:  Sutures   Suture size:  4-0   Suture material:  Nylon   Suture technique:  Running locked   Number of sutures:  1 (1  running interlock suture with 8 throws) Approximation:    Approximation:  Close Repair type:    Repair type:  Simple Post-procedure details:    Dressing:  Adhesive bandage   Procedure completion:  Tolerated well, no immediate complications      Medications  acetaminophen (TYLENOL) tablet 650 mg (650 mg Oral Given 08/15/20 2024)  lidocaine (PF) (XYLOCAINE) 1 % injection 10 mL (10 mLs Infiltration Given 08/15/20 2242)  Tdap (BOOSTRIX) injection 0.5 mL (0.5 mLs Intramuscular Given 08/15/20 2243)     ____________________________________________   INITIAL IMPRESSION / ASSESSMENT AND PLAN / ED COURSE  Pertinent labs & imaging results that were available during my care of the patient were reviewed by me and considered in my medical decision making (see chart for details).  Review of the [place] CSRS was performed in accordance of the [facility] prior to dispensing any controlled drugs.           Patient's diagnosis is consistent with thumb laceration.  Patient presented to emergency department with a laceration to the left thumb.  This occurred from broken glass.  No visible foreign body.  Good range of motion intact to the digit.  There was some bleeding on arrival but this had good cessation with suturing.  Wound care instructions discussed with the patient.  Antibiotics prophylactically.  Return precautions discussed with the patient.  Otherwise follow-up with primary care and in 1 week for suture removal.  Patient is given ED precautions to return to the ED for any worsening or new symptoms.     ____________________________________________  FINAL CLINICAL IMPRESSION(S) / ED DIAGNOSES  Final diagnoses:  Laceration of left thumb without foreign body without damage to nail, initial encounter      NEW MEDICATIONS STARTED DURING THIS VISIT:  ED Discharge Orders         Ordered    cephALEXin (KEFLEX) 500 MG capsule  2 times daily        08/15/20 2323              This chart was dictated using voice recognition software/Dragon. Despite best efforts to proofread, errors can occur which can change the meaning. Any change was purely unintentional.    Darletta Moll, PA-C 08/15/20 2329    Naaman Plummer, MD 08/15/20 [phone]

## 2020-08-19 ENCOUNTER — Other Ambulatory Visit: Payer: Self-pay | Admitting: Endocrinology

## 2020-08-19 ENCOUNTER — Other Ambulatory Visit: Payer: Self-pay

## 2020-08-19 DIAGNOSIS — E230 Hypopituitarism: Secondary | ICD-10-CM

## 2020-08-19 DIAGNOSIS — E201 Pseudohypoparathyroidism: Secondary | ICD-10-CM

## 2020-08-23 ENCOUNTER — Other Ambulatory Visit: Payer: 59

## 2020-08-23 NOTE — Telephone Encounter (Signed)
Please advise 

## 2020-08-24 LAB — BASIC METABOLIC PANEL
BUN/Creatinine Ratio: 13 (ref 9–20)
BUN: 11 mg/dL (ref 6–20)
CO2: 23 mmol/L (ref 20–29)
Calcium: 8.8 mg/dL (ref 8.7–10.2)
Chloride: 101 mmol/L (ref 96–106)
Creatinine, Ser: 0.85 mg/dL (ref 0.76–1.27)
Glucose: 86 mg/dL (ref 65–99)
Potassium: 3.6 mmol/L (ref 3.5–5.2)
Sodium: 142 mmol/L (ref 134–144)
eGFR: 122 mL/min/{1.73_m2} (ref >59)

## 2020-08-24 LAB — TESTOSTERONE: Testosterone: 666 ng/dL (ref 264–916)

## 2020-09-07 ENCOUNTER — Other Ambulatory Visit: Payer: Self-pay

## 2020-09-07 ENCOUNTER — Telehealth (INDEPENDENT_AMBULATORY_CARE_PROVIDER_SITE_OTHER): Payer: 59 | Admitting: Psychiatry

## 2020-09-07 ENCOUNTER — Encounter (HOSPITAL_COMMUNITY): Payer: Self-pay | Admitting: Psychiatry

## 2020-09-07 DIAGNOSIS — F900 Attention-deficit hyperactivity disorder, predominantly inattentive type: Secondary | ICD-10-CM | POA: Diagnosis not present

## 2020-09-07 DIAGNOSIS — F3341 Major depressive disorder, recurrent, in partial remission: Secondary | ICD-10-CM

## 2020-09-07 DIAGNOSIS — M797 Fibromyalgia: Secondary | ICD-10-CM | POA: Diagnosis not present

## 2020-09-07 DIAGNOSIS — F3342 Major depressive disorder, recurrent, in full remission: Secondary | ICD-10-CM | POA: Diagnosis not present

## 2020-09-07 MED ORDER — DULOXETINE HCL 30 MG PO CPEP: ORAL_CAPSULE | ORAL | 1 refills | Status: AC

## 2020-09-07 MED ORDER — QUETIAPINE FUMARATE 50 MG PO TABS: 50.0000 mg | ORAL_TABLET | Freq: Every day | ORAL | 1 refills | Status: DC

## 2020-09-07 MED ORDER — AMPHETAMINE-DEXTROAMPHETAMINE 5 MG PO TABS: 5.0000 mg | ORAL_TABLET | Freq: Every day | ORAL | 0 refills | Status: DC

## 2020-09-07 MED ORDER — AMPHETAMINE-DEXTROAMPHETAMINE 5 MG PO TABS: 5.0000 mg | ORAL_TABLET | Freq: Every day | ORAL | 0 refills | Status: AC

## 2020-09-07 MED ORDER — DOXEPIN HCL 10 MG PO CAPS
ORAL_CAPSULE | ORAL | 1 refills | Status: DC
Start: 2020-09-07 — End: 2021-04-12

## 2020-09-07 NOTE — Progress Notes (Signed)
[place] MD/PA/NP OP Progress Note  Virtual Visit via Telephone Note  I connected with Kevin Ramos on 09/07/20 at 11:30 AM EDT by telephone and verified that I am speaking with the correct person using two identifiers.  Location: Patient: home Provider: Clinic   I discussed the limitations, risks, security and privacy concerns of performing an evaluation and management service by telephone and the availability of in person appointments. I also discussed with the patient that there may be a patient responsible charge related to this service. The patient expressed understanding and agreed to proceed.   I provided 17 minutes of non-face-to-face time during this encounter.    09/07/2020 11:22 AM Kevin Ramos  MRN:  [id number]  Chief Complaint: "I feel I am doing better but can we increase the dose of Cymbalta?"  HPI: Patient reported he is doing well and he has started noticing some improvement in his fibromyalgia symptoms after being started on Cymbalta.  He stated that he has been on it for over a month then he asked if the dose can be increased because he wants to see if that would help him better.  He stated that he still dealing with the shoulder pain and is hoping to get some improvement in that soon. He stated that overall his mood has been stable and he has not been depressed lately.  He also has been taking Seroquel regularly at bedtime which helps him sleep well.  He also takes doxepin 10 mg capsule but it.    Patient stated that he had a lot of plans to return back to [place] by now however all his plans are still on hold because of his health conditions.  He stated that he is hoping once he gets everything under control and is on a good regimen he will pack up and return back there.  He stated that he feels 75 percent improved and wants to see if he can improve further.  Patient is already aware that writer is leaving this office. He informed that he has scheduled an  appointment to see his old psychiatrist Dr. Susanne Borders in [place] in August.  He stated that his mother has been going to see her for [place] in her clinic and when her mother brought this up then Dr. Jake Michaelis was willing to bring him back on.  He stated that the appointment is not August and he will see if he can get a sooner appointment.   Visit Diagnosis:    ICD-10-CM   1. MDD (major depressive disorder), recurrent, in partial remission ([place])  F33.41     2. Attention deficit hyperactivity disorder (ADHD), predominantly inattentive type  F90.0     3. Fibromyalgia  M79.7        Past Psychiatric History: MDD  Past Medical History:  Past Medical History:  Diagnosis Date   Acne    Depression    Dysplastic nevus 02/09/2019   right upper back, moderate atypia, close to margin.   No past surgical history on file.  Family Psychiatric History: denied  Family History:  Family History  Problem Relation Age of Onset   Hypoparathyroidism Mother     Social History:  Social History   Socioeconomic History   Marital status: Single    Spouse name: Not on file   Number of children: Not on file   Years of education: Not on file   Highest education level: Not on file  Occupational History   Not on file  Tobacco Use   Smoking status: Never   Smokeless tobacco: Never  Substance and Sexual Activity   Alcohol use: Never   Drug use: No   Sexual activity: Not on file  Other Topics Concern   Not on file  Social History Narrative   Not on file   Social Determinants of Health   Financial Resource Strain: Not on file  Food Insecurity: Not on file  Transportation Needs: Not on file  Physical Activity: Not on file  Stress: Not on file  Social Connections: Not on file    Allergies:  Allergies  Allergen Reactions   Abilify [Aripiprazole] Nausea Only    Metabolic Disorder Labs: Lab Results  Component Value Date   HGBA1C 4.9 10/29/2017   MPG 93.93 10/29/2017   Lab Results   Component Value Date   PROLACTIN 14.3 06/30/2019   PROLACTIN 17.9 (H) 05/05/2019   Lab Results  Component Value Date   CHOL 162 10/29/2017   TRIG 192 (H) 10/29/2017   HDL 35 (L) 10/29/2017   CHOLHDL 4.6 10/29/2017   VLDL 38 10/29/2017   LDLCALC 89 10/29/2017   Lab Results  Component Value Date   TSH 1.92 07/20/2020   TSH 2.24 05/18/2020    Therapeutic Level Labs: No results found for: LITHIUM No results found for: VALPROATE No components found for:  CBMZ  Current Medications: Current Outpatient Medications  Medication Sig Dispense Refill   acetaminophen (TYLENOL) 500 MG tablet Take 500 mg by mouth every 6 (six) hours as needed.     amphetamine-dextroamphetamine (ADDERALL) 5 MG tablet Take 1 tablet (5 mg total) by mouth daily. 30 tablet 0   amphetamine-dextroamphetamine (ADDERALL) 5 MG tablet Take 1 tablet (5 mg total) by mouth daily. 30 tablet 0   azelastine (ASTELIN) 0.1 % nasal spray Place into the nose.     calcitRIOL (ROCALTROL) 0.5 MCG capsule TAKE TWO CAPSULES BY MOUTH DAILY 180 capsule 2   calcium carbonate (OSCAL) 1500 (600 Ca) MG TABS tablet Take by mouth.     Calcium Carbonate-Vit D-Min (CALCIUM 1200 PO) Take 2 capsules by mouth daily.      cephALEXin (KEFLEX) 500 MG capsule Take 2 capsules (1,000 mg total) by mouth 2 (two) times daily. 28 capsule 0   cetirizine (ZYRTEC) 10 MG tablet Take by mouth.     Cholecalciferol (VITAMIN D) 50 MCG (2000 UT) tablet Take 2,000 Units by mouth daily.     clindamycin (CLEOCIN T) 1 % external solution Apply topically in the morning and at bedtime. scalp 30 mL 1   DESCOVY 200-25 MG tablet Take 1 tablet by mouth daily.     doxepin (SINEQUAN) 10 MG capsule Take 1 to 2 capsules at bedtime as needed for sleep 180 capsule 1   DULoxetine (CYMBALTA) 30 MG capsule Take 1 capsule (30 mg total) by mouth 2 (two) times daily. 60 capsule 1   fluticasone (FLONASE) 50 MCG/ACT nasal spray Place into the nose.     gabapentin (NEURONTIN) 300 MG  capsule Take by mouth.     hydrocortisone 2.5 % ointment Apply to corner of mouth twice daily for up to 1 week. 30 g 0   ibuprofen (ADVIL) 200 MG tablet Take 200 mg by mouth every 6 (six) hours as needed.     levothyroxine (SYNTHROID) 75 MCG tablet TAKE ONE TABLET BY MOUTH EVERY MORNING BEFORE BREAKFAST 90 tablet 0   mupirocin ointment (BACTROBAN) 2 % Apply 1 application topically 2 (two) times daily. Apply to corner of  mouth 22 g 0   nystatin (MYCOSTATIN) 100000 UNIT/ML suspension Take by mouth.     potassium chloride SA (KLOR-CON) 20 MEQ tablet Take 1 tablet (20 mEq total) by mouth daily. 30 tablet 3   propranolol (INDERAL) 10 MG tablet      QUEtiapine (SEROQUEL) 50 MG tablet Take 1 tablet (50 mg total) by mouth at bedtime. 30 tablet 1   Testosterone (ANDROGEL PUMP) 20.25 MG/ACT (1.62%) GEL Apply 1 pump on each shoulder daily after shower (Patient not taking: Reported on 07/20/2020) 75 g 2   triamcinolone ointment (KENALOG) 0.1 % Apply 1 application topically 2 (two) times daily as needed (Rash). Use for up to 2 weeks on affected areas of arms  Avoid applying to face, groin, and axilla. Use as directed. 60 g 1   No current facility-administered medications for this visit.     Psychiatric Specialty Exam: Review of Systems  There were no vitals taken for this visit.There is no height or weight on file to calculate BMI.  General Appearance: unable to assess due to phone visit  Eye Contact: unable to assess due to phone visit  Speech:  Clear and Coherent and Normal Rate  Volume:  Normal  Mood:  Euthymic  Affect:  Congruent  Thought Process:  Goal Directed, Linear and Descriptions of Associations: Intact  Orientation:  Full (Time, Place, and Person)  Thought Content: Logical   Suicidal Thoughts:  No  Homicidal Thoughts:  No  Memory:  Recent;   Good Remote;   Good  Judgement:  Good  Insight:  Good  Psychomotor Activity:  Normal  Concentration:  Concentration: Good and Attention Span:  Good  Recall:  Good  Fund of Knowledge: Good  Language: Good  Akathisia:  Negative  Handed:  Right  AIMS (if indicated): not done  Assets:  Communication Skills Desire for Improvement Financial Resources/Insurance Housing  ADL's:  Intact  Cognition: WNL  Sleep:  Good      Assessment and Plan: Patient is reporting improvement in his depression symptoms and also has fibromyalgia symptoms after being switched to Cymbalta about 6 weeks ago. He was to see if he can try higher dose to see if that would help him better.  We will increase the dose of Cymbalta to 90 mg/day. Potential side effects of medication and risks vs benefits of treatment vs non-treatment were explained and discussed. All questions were answered.   1. MDD (major depressive disorder), recurrent, in partial remission (H[facility])  - Increase DULoxetine (CYMBALTA) 30 MG capsule; Take 2 capsules in the morning and 1 capsule in the evening  Dispense: 90 capsule; Refill: 1 - QUEtiapine (SEROQUEL) 50 MG tablet; Take 1 tablet (50 mg total) by mouth at bedtime.  Dispense: 30 tablet; Refill: 1 - doxepin (SINEQUAN) 10 MG capsule; Take 1 to 2 capsules at bedtime as needed for sleep  Dispense: 180 capsule; Refill: 1  2. Attention deficit hyperactivity disorder (ADHD), predominantly inattentive type  - amphetamine-dextroamphetamine (ADDERALL) 5 MG tablet; Take 1 tablet (5 mg total) by mouth daily.  Dispense: 30 tablet; Refill: 0 - amphetamine-dextroamphetamine (ADDERALL) 5 MG tablet; Take 1 tablet (5 mg total) by mouth daily.  Dispense: 30 tablet; Refill: 0  3. Fibromyalgia  - Increase DULoxetine (CYMBALTA) 30 MG capsule; Take 2 capsules in the morning and 1 capsule in the evening  Dispense: 90 capsule; Refill: 1   Continue individual therapy with Ms. Kevin Ramos. Patient has scheduled an appointment to see Dr. Susanne Borders for his psychiatry  follow-up care after the writer leaves the office.  He informed that he has an appointment  scheduled with her office in August.  [place] Crane, MD 09/07/2020, 11:22 AM

## 2020-10-12 ENCOUNTER — Other Ambulatory Visit: Payer: Self-pay

## 2020-10-12 ENCOUNTER — Ambulatory Visit (INDEPENDENT_AMBULATORY_CARE_PROVIDER_SITE_OTHER): Payer: 59 | Admitting: Dermatology

## 2020-10-12 DIAGNOSIS — D229 Melanocytic nevi, unspecified: Secondary | ICD-10-CM

## 2020-10-12 DIAGNOSIS — L700 Acne vulgaris: Secondary | ICD-10-CM | POA: Diagnosis not present

## 2020-10-12 DIAGNOSIS — L821 Other seborrheic keratosis: Secondary | ICD-10-CM | POA: Diagnosis not present

## 2020-10-12 DIAGNOSIS — L708 Other acne: Secondary | ICD-10-CM

## 2020-10-12 DIAGNOSIS — Z1283 Encounter for screening for malignant neoplasm of skin: Secondary | ICD-10-CM

## 2020-10-12 DIAGNOSIS — Z86018 Personal history of other benign neoplasm: Secondary | ICD-10-CM

## 2020-10-12 DIAGNOSIS — L578 Other skin changes due to chronic exposure to nonionizing radiation: Secondary | ICD-10-CM

## 2020-10-12 DIAGNOSIS — L814 Other melanin hyperpigmentation: Secondary | ICD-10-CM | POA: Diagnosis not present

## 2020-10-12 DIAGNOSIS — D1800 Hemangioma unspecified site: Secondary | ICD-10-CM

## 2020-10-12 MED ORDER — DOXYCYCLINE HYCLATE 100 MG PO TABS: ORAL_TABLET | ORAL | 1 refills | Status: DC

## 2020-10-12 MED ORDER — MUPIROCIN 2 % EX OINT: TOPICAL_OINTMENT | CUTANEOUS | 0 refills | Status: DC

## 2020-10-12 NOTE — Patient Instructions (Addendum)

## 2020-10-12 NOTE — Progress Notes (Signed)
Follow-Up Visit   Subjective  Kevin Ramos is a 25 y.o. male who presents for the following: Acne (Pt c/o acne flare on the left cheek and back, treating with otc Cetaphil cream,  past treatment Isotretinoin therapy 3 different courses ) and Skin Problem (Check a growth on the penis pt recently noticed 1 day ago. ). The patient presents for Upper Body Skin Exam (UBSE) for skin cancer screening and mole check.   The following portions of the chart were reviewed this encounter and updated as appropriate:   Tobacco  Allergies  Meds  Problems  Med Hx  Surg Hx  Fam Hx     Review of Systems:  No other skin or systemic complaints except as noted in HPI or Assessment and Plan.  Objective  Well appearing patient in no apparent distress; mood and affect are within normal limits.  A focused examination was performed including head, including the scalp, face, neck, nose, ears, eyelids, and lips and face,back, groin . Relevant physical exam findings are noted in the Assessment and Plan.  face,chest,back 1 active papule on the left cheek, scarring on the face and back   penile shaft Pink papule   Assessment & Plan  Acne vulgaris face,chest,back  Chronic and persistent, but much improved after third course of isotretinoin.   1 persistent papule the cheek is bothersome to patient.   Start Doxycyline 100 mg take 1 tablet daily with food  Intralesional injection - face,chest,back Location: left cheek   Informed Consent: Discussed risks (infection, pain, bleeding, bruising, thinning of the skin, loss of skin pigment, lack of resolution, and recurrence of lesion) and benefits of the procedure, as well as the alternatives. Informed consent was obtained. Preparation: The area was prepared a standard fashion.  Procedure Details: An intralesional injection was performed with Kenalog 2 mg/cc. 0.05 cc in total were injected.  Total number of injections: 2  Plan: The patient was  instructed on post-op care. Recommend OTC analgesia as needed for pain.  [place] number [id number] LOT [id number] EXP Sept 2023  Related Medications doxycycline (VIBRA-TABS) 100 MG tablet Take 1 tablet daily with food  Other acne penile shaft Acne pimple vs Bite   Start Doxycyline 100 mg take 1 tablet daily with food Start Mupirocin ointment apply to affected skin qd-bid   Related Medications doxycycline (VIBRA-TABS) 100 MG tablet Take 1 tablet daily with food  mupirocin ointment (BACTROBAN) 2 % Apply to skin qd-bid  Lentigines - Scattered tan macules - Due to sun exposure - Benign-appering, observe - Recommend daily broad spectrum sunscreen SPF 30+ to sun-exposed areas, reapply every 2 hours as needed. - Call for any changes  Seborrheic Keratoses - Stuck-on, waxy, tan-brown papules and/or plaques  - Benign-appearing - Discussed benign etiology and prognosis. - Observe - Call for any changes  Melanocytic Nevi - Tan-brown and/or pink-flesh-colored symmetric macules and papules - Benign appearing on exam today - Observation - Call clinic for new or changing moles - Recommend daily use of broad spectrum spf 30+ sunscreen to sun-exposed areas.   Hemangiomas - Red papules - Discussed benign nature - Observe - Call for any changes  Actinic Damage - Chronic condition, secondary to cumulative UV/sun exposure - diffuse scaly erythematous macules with underlying dyspigmentation - Recommend daily broad spectrum sunscreen SPF 30+ to sun-exposed areas, reapply every 2 hours as needed.  - Staying in the shade or wearing long sleeves, sun glasses (UVA+UVB protection) and wide brim hats (4-inch brim around the entire  circumference of the hat) are also recommended for sun protection.  - Call for new or changing lesions.  History of Dysplastic Nevi Multiple see history  - No evidence of recurrence today - Recommend regular full body skin exams - Recommend daily broad spectrum  sunscreen SPF 30+ to sun-exposed areas, reapply every 2 hours as needed.  - Call if any new or changing lesions are noted between office visits  Skin cancer screening performed today.   Return for as scheduled Feb 2023 for TBSE .  IMarye Round, CMA, am acting as scribe for Sarina Ser, MD .  Documentation: I have reviewed the above documentation for accuracy and completeness, and I agree with the above.  Sarina Ser, MD

## 2020-10-13 ENCOUNTER — Other Ambulatory Visit: Payer: Self-pay | Admitting: Podiatry

## 2020-10-13 ENCOUNTER — Ambulatory Visit (INDEPENDENT_AMBULATORY_CARE_PROVIDER_SITE_OTHER): Payer: 59

## 2020-10-13 ENCOUNTER — Encounter: Payer: Self-pay | Admitting: Dermatology

## 2020-10-13 ENCOUNTER — Ambulatory Visit: Payer: 59 | Admitting: Podiatry

## 2020-10-13 ENCOUNTER — Encounter: Payer: Self-pay | Admitting: Podiatry

## 2020-10-13 DIAGNOSIS — M722 Plantar fascial fibromatosis: Secondary | ICD-10-CM

## 2020-10-13 NOTE — Progress Notes (Signed)
Subjective:  Patient ID: Kevin Ramos, male    DOB: 1996/02/06,  MRN: [id number]  Chief Complaint  Patient presents with   Plantar Fasciitis    Right heel pain mainly after driving     25 y.o. male presents with the above complaint.  Patient presents with right heel pain mainly while driving.  Patient states painful to touch.  He thinks the plantar fasciitis may come back.  He was treated in the past for Planter fasciitis by me.  He has given his orthotics and shoes.  He denies any other acute complaints he would like to get the treated.   Review of Systems: Negative except as noted in the HPI. Denies N/V/F/Ch.  Past Medical History:  Diagnosis Date   Acne    Depression    Dysplastic nevus 02/09/2019   right upper back, moderate atypia, close to margin.    Current Outpatient Medications:    acetaminophen (TYLENOL) 500 MG tablet, Take 500 mg by mouth every 6 (six) hours as needed., Disp: , Rfl:    amphetamine-dextroamphetamine (ADDERALL) 5 MG tablet, Take 1 tablet (5 mg total) by mouth daily., Disp: 30 tablet, Rfl: 0   amphetamine-dextroamphetamine (ADDERALL) 5 MG tablet, Take 1 tablet (5 mg total) by mouth daily., Disp: 30 tablet, Rfl: 0   azelastine (ASTELIN) 0.1 % nasal spray, Place into the nose., Disp: , Rfl:    calcitRIOL (ROCALTROL) 0.5 MCG capsule, TAKE TWO CAPSULES BY MOUTH DAILY, Disp: 180 capsule, Rfl: 2   calcium carbonate (OSCAL) 1500 (600 Ca) MG TABS tablet, Take by mouth., Disp: , Rfl:    Calcium Carbonate-Vit D-Min (CALCIUM 1200 PO), Take 2 capsules by mouth daily. , Disp: , Rfl:    cephALEXin (KEFLEX) 500 MG capsule, Take 2 capsules (1,000 mg total) by mouth 2 (two) times daily., Disp: 28 capsule, Rfl: 0   cetirizine (ZYRTEC) 10 MG tablet, Take by mouth., Disp: , Rfl:    Cholecalciferol (VITAMIN D) 50 MCG (2000 UT) tablet, Take 2,000 Units by mouth daily., Disp: , Rfl:    clindamycin (CLEOCIN T) 1 % external solution, Apply topically in the morning and at  bedtime. scalp, Disp: 30 mL, Rfl: 1   DESCOVY 200-25 MG tablet, Take 1 tablet by mouth daily., Disp: , Rfl:    doxepin (SINEQUAN) 10 MG capsule, Take 1 to 2 capsules at bedtime as needed for sleep, Disp: 180 capsule, Rfl: 1   doxycycline (VIBRA-TABS) 100 MG tablet, Take 1 tablet daily with food, Disp: 30 tablet, Rfl: 1   DULoxetine (CYMBALTA) 30 MG capsule, Take 2 capsules in the morning and 1 capsule in the evening, Disp: 90 capsule, Rfl: 1   fluticasone (FLONASE) 50 MCG/ACT nasal spray, Place into the nose., Disp: , Rfl:    gabapentin (NEURONTIN) 300 MG capsule, Take by mouth., Disp: , Rfl:    hydrocortisone 2.5 % ointment, Apply to corner of mouth twice daily for up to 1 week., Disp: 30 g, Rfl: 0   levothyroxine (SYNTHROID) 75 MCG tablet, TAKE ONE TABLET BY MOUTH EVERY MORNING BEFORE BREAKFAST, Disp: 90 tablet, Rfl: 0   mupirocin ointment (BACTROBAN) 2 %, Apply to skin qd-bid, Disp: 22 g, Rfl: 0   nystatin (MYCOSTATIN) 100000 UNIT/ML suspension, Take by mouth., Disp: , Rfl:    potassium chloride SA (KLOR-CON) 20 MEQ tablet, Take 1 tablet (20 mEq total) by mouth daily., Disp: 30 tablet, Rfl: 3   propranolol (INDERAL) 10 MG tablet, , Disp: , Rfl:    QUEtiapine (SEROQUEL) 50  MG tablet, Take 1 tablet (50 mg total) by mouth at bedtime., Disp: 30 tablet, Rfl: 1   triamcinolone ointment (KENALOG) 0.1 %, Apply 1 application topically 2 (two) times daily as needed (Rash). Use for up to 2 weeks on affected areas of arms  Avoid applying to face, groin, and axilla. Use as directed., Disp: 60 g, Rfl: 1  Social History   Tobacco Use  Smoking Status Never  Smokeless Tobacco Never    Allergies  Allergen Reactions   Abilify [Aripiprazole] Nausea Only   Objective:  There were no vitals filed for this visit. There is no height or weight on file to calculate BMI. Constitutional Well developed. Well nourished.  Vascular Dorsalis pedis pulses palpable bilaterally. Posterior tibial pulses palpable  bilaterally. Capillary refill normal to all digits.  No cyanosis or clubbing noted. Pedal hair growth normal.  Neurologic Normal speech. Oriented to person, place, and time. Epicritic sensation to light touch grossly present bilaterally.  Dermatologic Nails well groomed and normal in appearance. No open wounds. No skin lesions.  Orthopedic: Normal joint ROM without pain or crepitus bilaterally. No visible deformities. Tender to palpation at the calcaneal tuber right No pain with calcaneal squeeze right Ankle ROM diminished range of motion right Silfverskiold Test: positive right   Radiographs: Taken and reviewed. No acute fractures or dislocations. No evidence of stress fracture.  Plantar heel spur present. Posterior heel spur present.   Assessment:   1. Plantar fasciitis, right    Plan:  Patient was evaluated and treated and all questions answered.  Plantar Fasciitis, right - XR reviewed as above.  - Educated on icing and stretching. Instructions given.  - Injection delivered to the plantar fascia as below. - DME: Plantar Fascial Brace - Pharmacologic management: None  Procedure: Injection Tendon/Ligament Location: Right plantar fascia at the glabrous junction; medial approach. Skin Prep: alcohol Injectate: 0.5 cc 0.5% marcaine plain, 0.5 cc of 1% Lidocaine, 0.5 cc kenalog 10. Disposition: Patient tolerated procedure well. Injection site dressed with a band-aid.  No follow-ups on file.

## 2020-10-17 ENCOUNTER — Telehealth: Payer: Self-pay | Admitting: Family Medicine

## 2020-10-17 NOTE — Telephone Encounter (Signed)
Patient called because he was wondering if he could or should get the monkey pox vaccine (if available) as he is an immunocompromised, gay man moving to [place] soon. Will be available all day this week for phone call.

## 2020-10-18 NOTE — Telephone Encounter (Signed)
Spoke with patient regarding Monkeypox vaccine and eligibility.  Referred to [place], [place].

## 2020-11-02 NOTE — Progress Notes (Addendum)
Patient ID: Kevin Ramos, male   DOB: 09-Jan-1996, 25 y.o.   MRN: [id number]             Chief complaint: Endocrinology follow-up  History of Present Illness:  Problem 1: HYPOCALCEMIA  RECENT history:  Since 4/21 he has been on 0.5 mcg of calcitriol, previously had required larger doses Also takes 1 tablets of 1200 mg calcium twice a day  He has taken his regimen quite regularly now  Usually has complaints of tingling in the hands or face cramping or stiffness associated with low calcium levels No recurrence of symptoms  No twitching or cramping in the muscles or stiffness  Labs are pending from today, calcium was normal the last 3 times     Lab Results  Component Value Date   CALCIUM 8.8 08/23/2020   CALCIUM 9.4 07/20/2020   CALCIUM 9.4 05/18/2020    VITAMIN D deficiency: He is taking vitamin D3, 2000 units daily   Lab Results  Component Value Date   VD25OH 20.83 (L) 03/25/2019   VD25OH 30.7 10/29/2017   VD25OH 41.22 12/27/2016   HYPOTHYROIDISM:  He had thyroid functions checked because of fatigue in 12/20 Free T4 was low normal at 0.63 as of 2/21 With his TSH is mildly increased he was told to start taking levothyroxine 25 mcg daily  Previously had symptoms of fatigue and sleepiness His only complaint is weight gain  He is now on 75 mcg levothyroxine since 9/21 He has not able to take this consistently as he did not like the routine of taking it 30 hours before eating   Lab Results  Component Value Date   TSH 1.92 07/20/2020   TSH 2.24 05/18/2020   TSH 4.25 12/14/2019   FREET4 0.91 07/20/2020   FREET4 0.91 05/18/2020   FREET4 0.77 02/16/2020   Problem 2: HYPOGONADISM:  Because of his significant fatigue, lack of energy and motivation testosterone was checked and free testosterone was low on 2 occasions LH normal along with prolactin  Previously was consistently using his 4 mg Androderm and was able to continue despite some scaling  irritation He was switched to AndroGel   Recently has had fairly good energy without a previous fatigue  Has had normal testosterone level but because of the relatively high level within a month of starting the gel he was told to reduce the dose to 1 pump daily  Lab Results  Component Value Date   TESTOSTERONE 666 08/23/2020   TESTOSTERONE 194.35 (L) 07/20/2020   TESTOSTERONE 426.08 05/18/2020   TESTOSTERONE 196.61 (L) 02/16/2020   TESTOSTERONE 239.04 (L) 12/14/2019   Lab Results  Component Value Date   TESTOFREE 6.7 (L) 05/05/2019   TESTOFREE 7.5 (L) 04/30/2019      Past Medical History:  Diagnosis Date   Acne    Depression    Dysplastic nevus 02/09/2019   right upper back, moderate atypia, close to margin.    No past surgical history on file.  Family History  Problem Relation Age of Onset   Hypoparathyroidism Mother     Social History:  reports that he has never smoked. He has never used smokeless tobacco. He reports that he does not drink alcohol and does not use drugs.   Allergies:  Allergies  Allergen Reactions   Abilify [Aripiprazole] Nausea Only    Allergies as of 11/03/2020       Reactions   Abilify [aripiprazole] Nausea Only        Medication List  Accurate as of November 03, 2020  9:12 AM. If you have any questions, ask your nurse or doctor.          acetaminophen 500 MG tablet Commonly known as: TYLENOL Take 500 mg by mouth every 6 (six) hours as needed.   amphetamine-dextroamphetamine 5 MG tablet Commonly known as: Adderall Take 1 tablet (5 mg total) by mouth daily. What changed: Another medication with the same name was removed. Continue taking this medication, and follow the directions you see here. Changed by: Elayne Snare, MD   azelastine 0.1 % nasal spray Commonly known as: ASTELIN Place into the nose.   calcitRIOL 0.5 MCG capsule Commonly known as: ROCALTROL TAKE TWO CAPSULES BY MOUTH DAILY   CALCIUM 1200 PO Take 2  capsules by mouth daily.   calcium carbonate 1500 (600 Ca) MG Tabs tablet Commonly known as: OSCAL Take by mouth.   cephALEXin 500 MG capsule Commonly known as: KEFLEX Take 2 capsules (1,000 mg total) by mouth 2 (two) times daily.   cetirizine 10 MG tablet Commonly known as: ZYRTEC Take by mouth.   clindamycin 1 % external solution Commonly known as: CLEOCIN T Apply topically in the morning and at bedtime. scalp   Descovy 200-25 MG tablet Generic drug: emtricitabine-tenofovir AF Take 1 tablet by mouth daily.   doxepin 10 MG capsule Commonly known as: SINEQUAN Take 1 to 2 capsules at bedtime as needed for sleep   doxycycline 100 MG tablet Commonly known as: VIBRA-TABS Take 1 tablet daily with food   DULoxetine 30 MG capsule Commonly known as: Cymbalta Take 2 capsules in the morning and 1 capsule in the evening   fluticasone 50 MCG/ACT nasal spray Commonly known as: FLONASE Place into the nose.   gabapentin 300 MG capsule Commonly known as: NEURONTIN Take by mouth.   hydrocortisone 2.5 % ointment Apply to corner of mouth twice daily for up to 1 week.   levothyroxine 75 MCG tablet Commonly known as: SYNTHROID TAKE ONE TABLET BY MOUTH EVERY MORNING BEFORE BREAKFAST   mupirocin ointment 2 % Commonly known as: BACTROBAN Apply to skin qd-bid   nystatin 100000 UNIT/ML suspension Commonly known as: MYCOSTATIN Take by mouth.   potassium chloride SA 20 MEQ tablet Commonly known as: KLOR-CON Take 1 tablet (20 mEq total) by mouth daily.   propranolol 10 MG tablet Commonly known as: INDERAL   QUEtiapine 50 MG tablet Commonly known as: SEROQUEL Take 1 tablet (50 mg total) by mouth at bedtime.   triamcinolone ointment 0.1 % Commonly known as: KENALOG Apply 1 application topically 2 (two) times daily as needed (Rash). Use for up to 2 weeks on affected areas of arms  Avoid applying to face, groin, and axilla. Use as directed.   Vitamin D 50 MCG (2000 UT)  tablet Take 2,000 Units by mouth daily.            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  Has been very regular now with his calcitriol and calcium supplements  No symptoms of hypocalcemia at this time This year her calcium has been consistently normal  Labs pending  HYPOTHYROIDISM: 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, TSH is relatively higher than before from the outside lab without any change in free T4  Since he has been somewhat irregular with his levothyroxine in the morning this may be the reason for his high TSH  Weight gain: This does not appear to be related to endocrine causes   HYPOGONADISM: 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

## 2020-11-03 ENCOUNTER — Encounter: Payer: Self-pay | Admitting: Endocrinology

## 2020-11-03 ENCOUNTER — Ambulatory Visit (INDEPENDENT_AMBULATORY_CARE_PROVIDER_SITE_OTHER): Payer: Managed Care, Other (non HMO) | Admitting: Endocrinology

## 2020-11-03 ENCOUNTER — Other Ambulatory Visit: Payer: Self-pay

## 2020-11-03 VITALS — BP 118/78 | HR 93 | Ht 69.5 in | Wt 191.0 lb

## 2020-11-03 DIAGNOSIS — E038 Other specified hypothyroidism: Secondary | ICD-10-CM

## 2020-11-03 DIAGNOSIS — E201 Pseudohypoparathyroidism: Secondary | ICD-10-CM

## 2020-11-03 DIAGNOSIS — E230 Hypopituitarism: Secondary | ICD-10-CM

## 2020-11-03 LAB — BASIC METABOLIC PANEL
BUN: 10 mg/dL (ref 6–23)
CO2: 31 mEq/L (ref 19–32)
Calcium: 9.2 mg/dL (ref 8.4–10.5)
Chloride: 101 mEq/L (ref 96–112)
Creatinine, Ser: 0.91 mg/dL (ref 0.40–1.50)
GFR: 115.7 mL/min (ref >60.00)
Glucose, Bld: 71 mg/dL (ref 70–99)
Potassium: 3.7 mEq/L (ref 3.5–5.1)
Sodium: 139 mEq/L (ref 135–145)

## 2020-11-03 LAB — TESTOSTERONE: Testosterone: 260.28 ng/dL — ABNORMAL LOW (ref 300.00–890.00)

## 2020-11-08 ENCOUNTER — Other Ambulatory Visit: Payer: Self-pay | Admitting: Endocrinology

## 2020-11-10 ENCOUNTER — Ambulatory Visit: Payer: Managed Care, Other (non HMO) | Admitting: Podiatry

## 2020-11-10 ENCOUNTER — Other Ambulatory Visit: Payer: Self-pay

## 2020-11-10 ENCOUNTER — Encounter: Payer: Self-pay | Admitting: Podiatry

## 2020-11-10 DIAGNOSIS — G589 Mononeuropathy, unspecified: Secondary | ICD-10-CM | POA: Diagnosis not present

## 2020-11-10 DIAGNOSIS — L600 Ingrowing nail: Secondary | ICD-10-CM

## 2020-11-10 DIAGNOSIS — M792 Neuralgia and neuritis, unspecified: Secondary | ICD-10-CM

## 2020-11-10 DIAGNOSIS — R200 Anesthesia of skin: Secondary | ICD-10-CM | POA: Diagnosis not present

## 2020-11-10 DIAGNOSIS — R202 Paresthesia of skin: Secondary | ICD-10-CM | POA: Diagnosis not present

## 2020-11-10 NOTE — Progress Notes (Signed)
Subjective:  Patient ID: Kevin Ramos, male    DOB: 1995/07/18,  MRN: [id number]  Chief Complaint  Patient presents with   Plantar Fasciitis    PT stated that he is still having pain when he drives     25 y.o. male presents with the above complaint.  Patient presents with recurring right hallux medial border ingrown.  Patient states is painful to touch.  Patient would like to have it removed.  He also has secondary complaint of numbness tingling to his plantar foot that happens especially while driving.  He has had a history of hip surgery.  He wanted to get it evaluated.  He states that the numbness tingling tends to get progressively worse.  He denies any other acute complaints.  He is scheduled to see his orthopedic doctor for further evaluation as well.  He has not had a nerve conduction study done since before his hip surgery   Review of Systems: Negative except as noted in the HPI. Denies N/V/F/Ch.  Past Medical History:  Diagnosis Date   Acne    Depression    Dysplastic nevus 02/09/2019   right upper back, moderate atypia, close to margin.    Current Outpatient Medications:    acetaminophen (TYLENOL) 500 MG tablet, Take 500 mg by mouth every 6 (six) hours as needed., Disp: , Rfl:    amphetamine-dextroamphetamine (ADDERALL) 5 MG tablet, Take 1 tablet (5 mg total) by mouth daily., Disp: 30 tablet, Rfl: 0   azelastine (ASTELIN) 0.1 % nasal spray, Place into the nose., Disp: , Rfl:    calcitRIOL (ROCALTROL) 0.5 MCG capsule, TAKE TWO CAPSULES BY MOUTH DAILY, Disp: 180 capsule, Rfl: 2   calcium carbonate (OSCAL) 1500 (600 Ca) MG TABS tablet, Take by mouth., Disp: , Rfl:    Calcium Carbonate-Vit D-Min (CALCIUM 1200 PO), Take 2 capsules by mouth daily. , Disp: , Rfl:    cephALEXin (KEFLEX) 500 MG capsule, Take 2 capsules (1,000 mg total) by mouth 2 (two) times daily., Disp: 28 capsule, Rfl: 0   cetirizine (ZYRTEC) 10 MG tablet, Take by mouth., Disp: , Rfl:    Cholecalciferol  (VITAMIN D) 50 MCG (2000 UT) tablet, Take 2,000 Units by mouth daily., Disp: , Rfl:    clindamycin (CLEOCIN T) 1 % external solution, Apply topically in the morning and at bedtime. scalp, Disp: 30 mL, Rfl: 1   DESCOVY 200-25 MG tablet, Take 1 tablet by mouth daily., Disp: , Rfl:    doxepin (SINEQUAN) 10 MG capsule, Take 1 to 2 capsules at bedtime as needed for sleep, Disp: 180 capsule, Rfl: 1   doxycycline (VIBRA-TABS) 100 MG tablet, Take 1 tablet daily with food, Disp: 30 tablet, Rfl: 1   DULoxetine (CYMBALTA) 30 MG capsule, Take 2 capsules in the morning and 1 capsule in the evening, Disp: 90 capsule, Rfl: 1   fluticasone (FLONASE) 50 MCG/ACT nasal spray, Place into the nose., Disp: , Rfl:    gabapentin (NEURONTIN) 300 MG capsule, Take by mouth., Disp: , Rfl:    hydrocortisone 2.5 % ointment, Apply to corner of mouth twice daily for up to 1 week., Disp: 30 g, Rfl: 0   levothyroxine (SYNTHROID) 75 MCG tablet, TAKE ONE TABLET BY MOUTH EVERY MORNING BEFORE BREAKFAST, Disp: 90 tablet, Rfl: 0   mupirocin ointment (BACTROBAN) 2 %, Apply to skin qd-bid, Disp: 22 g, Rfl: 0   nystatin (MYCOSTATIN) 100000 UNIT/ML suspension, Take by mouth., Disp: , Rfl:    potassium chloride SA (KLOR-CON) 20 MEQ  tablet, Take 1 tablet (20 mEq total) by mouth daily., Disp: 30 tablet, Rfl: 3   propranolol (INDERAL) 10 MG tablet, , Disp: , Rfl:    QUEtiapine (SEROQUEL) 50 MG tablet, Take 1 tablet (50 mg total) by mouth at bedtime., Disp: 30 tablet, Rfl: 1   triamcinolone ointment (KENALOG) 0.1 %, Apply 1 application topically 2 (two) times daily as needed (Rash). Use for up to 2 weeks on affected areas of arms  Avoid applying to face, groin, and axilla. Use as directed., Disp: 60 g, Rfl: 1  Social History   Tobacco Use  Smoking Status Never  Smokeless Tobacco Never    Allergies  Allergen Reactions   Abilify [Aripiprazole] Nausea Only   Objective:  There were no vitals filed for this visit. There is no height or  weight on file to calculate BMI. Constitutional Well developed. Well nourished.  Vascular Dorsalis pedis pulses palpable bilaterally. Posterior tibial pulses palpable bilaterally. Capillary refill normal to all digits.  No cyanosis or clubbing noted. Pedal hair growth normal.  Neurologic Normal speech. Oriented to person, place, and time. Epicritic sensation to light touch grossly present bilaterally.  Positive Tinel's sign noted to the right lower extremity negative Tinel's sign to the left lower extremity.  Positive Tinel's sign at the tarsal tunnel and as well as at the fibular head.  Dermatologic Painful ingrowing nail at medial nail borders of the hallux nail right. No other open wounds. No skin lesions.  Orthopedic: Normal joint ROM without pain or crepitus bilaterally. No visible deformities. No bony tenderness.   Radiographs: None Assessment:   1. Neuritis   2. Numbness and tingling   3. Ingrown right greater toenail   4. Nerve compression syndrome    Plan:  Patient was evaluated and treated and all questions answered.  Nerve compression/numbness tingling right lower extremity -I explained the patient the etiology of nerve compression syndrome and various treatment options were extensively discussed.  Ultimately given that patient has not had a nerve conduction study done since before the hip surgery I believe patient will benefit from a nerve conduction study to further evaluate the positive Tinel's sign to the tarsal tunnel as well as the fibular head for common peroneal nerve.  It is possible that it could be coming from impingement of the sciatic nerve versus the lower back.  I discussed this with patient he states understand would like to further evaluate -He was scheduled for nerve conduction study  Ingrown Nail, right -Patient elects to proceed with minor surgery to remove ingrown toenail removal today. Consent reviewed and signed by patient. -Ingrown nail excised.  See procedure note. -Educated on post-procedure care including soaking. Written instructions provided and reviewed. -Patient to follow up in 2 weeks for nail check.  Procedure: Excision of Ingrown Toenail Location: Right 1st toe medial nail borders. Anesthesia: Lidocaine 1% plain; 1.5 mL and Marcaine 0.5% plain; 1.5 mL, digital block. Skin Prep: Betadine. Dressing: Silvadene; telfa; dry, sterile, compression dressing. Technique: Following skin prep, the toe was exsanguinated and a tourniquet was secured at the base of the toe. The affected nail border was freed, split with a nail splitter, and excised. Chemical matrixectomy was then performed with phenol and irrigated out with alcohol. The tourniquet was then removed and sterile dressing applied. Disposition: Patient tolerated procedure well. Patient to return in 2 weeks for follow-up.   No follow-ups on file.

## 2020-12-13 ENCOUNTER — Other Ambulatory Visit: Payer: Self-pay

## 2020-12-13 ENCOUNTER — Ambulatory Visit: Payer: Managed Care, Other (non HMO) | Admitting: Podiatry

## 2020-12-13 DIAGNOSIS — R202 Paresthesia of skin: Secondary | ICD-10-CM

## 2020-12-13 DIAGNOSIS — M792 Neuralgia and neuritis, unspecified: Secondary | ICD-10-CM

## 2020-12-13 DIAGNOSIS — R200 Anesthesia of skin: Secondary | ICD-10-CM

## 2020-12-13 NOTE — Progress Notes (Signed)
Subjective:  Patient ID: Kevin Ramos, male    DOB: 1995-04-14,  MRN: [id number]  Chief Complaint  Patient presents with   Foot Pain    PT stated that he is doing well he is scheduled for his nerve conduction test tomorrow     25 y.o. male presents with the above complaint.  Patient presents with follow-up to right hallux medial border ingrown removal.  Patient states is doing a lot better no pain.  He denies any other acute complaints.  He also has secondary complaint of numbness tingling to his plantar foot that happens especially while driving.  He has had a history of hip surgery.  He wanted to get it evaluated.  He states that the numbness tingling tends to get progressively worse.  He denies any other acute complaints.  He is scheduled to see his orthopedic doctor for further evaluation as well.  He is scheduled to get nerve conduction done tomorrow.  Review of Systems: Negative except as noted in the HPI. Denies N/V/F/Ch.  Past Medical History:  Diagnosis Date   Acne    Depression    Dysplastic nevus 02/09/2019   right upper back, moderate atypia, close to margin.    Current Outpatient Medications:    acetaminophen (TYLENOL) 500 MG tablet, Take 500 mg by mouth every 6 (six) hours as needed., Disp: , Rfl:    amphetamine-dextroamphetamine (ADDERALL) 5 MG tablet, Take 1 tablet (5 mg total) by mouth daily., Disp: 30 tablet, Rfl: 0   azelastine (ASTELIN) 0.1 % nasal spray, Place into the nose., Disp: , Rfl:    calcitRIOL (ROCALTROL) 0.5 MCG capsule, TAKE TWO CAPSULES BY MOUTH DAILY, Disp: 180 capsule, Rfl: 2   calcium carbonate (OSCAL) 1500 (600 Ca) MG TABS tablet, Take by mouth., Disp: , Rfl:    Calcium Carbonate-Vit D-Min (CALCIUM 1200 PO), Take 2 capsules by mouth daily. , Disp: , Rfl:    cephALEXin (KEFLEX) 500 MG capsule, Take 2 capsules (1,000 mg total) by mouth 2 (two) times daily., Disp: 28 capsule, Rfl: 0   cetirizine (ZYRTEC) 10 MG tablet, Take by mouth., Disp: ,  Rfl:    Cholecalciferol (VITAMIN D) 50 MCG (2000 UT) tablet, Take 2,000 Units by mouth daily., Disp: , Rfl:    clindamycin (CLEOCIN T) 1 % external solution, Apply topically in the morning and at bedtime. scalp, Disp: 30 mL, Rfl: 1   DESCOVY 200-25 MG tablet, Take 1 tablet by mouth daily., Disp: , Rfl:    doxepin (SINEQUAN) 10 MG capsule, Take 1 to 2 capsules at bedtime as needed for sleep, Disp: 180 capsule, Rfl: 1   doxycycline (VIBRA-TABS) 100 MG tablet, Take 1 tablet daily with food, Disp: 30 tablet, Rfl: 1   DULoxetine (CYMBALTA) 30 MG capsule, Take 2 capsules in the morning and 1 capsule in the evening, Disp: 90 capsule, Rfl: 1   fluticasone (FLONASE) 50 MCG/ACT nasal spray, Place into the nose., Disp: , Rfl:    gabapentin (NEURONTIN) 300 MG capsule, Take by mouth., Disp: , Rfl:    hydrocortisone 2.5 % ointment, Apply to corner of mouth twice daily for up to 1 week., Disp: 30 g, Rfl: 0   levothyroxine (SYNTHROID) 75 MCG tablet, TAKE ONE TABLET BY MOUTH EVERY MORNING BEFORE BREAKFAST, Disp: 90 tablet, Rfl: 0   mupirocin ointment (BACTROBAN) 2 %, Apply to skin qd-bid, Disp: 22 g, Rfl: 0   nystatin (MYCOSTATIN) 100000 UNIT/ML suspension, Take by mouth., Disp: , Rfl:    potassium chloride SA (  KLOR-CON) 20 MEQ tablet, Take 1 tablet (20 mEq total) by mouth daily., Disp: 30 tablet, Rfl: 3   propranolol (INDERAL) 10 MG tablet, , Disp: , Rfl:    QUEtiapine (SEROQUEL) 50 MG tablet, Take 1 tablet (50 mg total) by mouth at bedtime., Disp: 30 tablet, Rfl: 1   triamcinolone ointment (KENALOG) 0.1 %, Apply 1 application topically 2 (two) times daily as needed (Rash). Use for up to 2 weeks on affected areas of arms  Avoid applying to face, groin, and axilla. Use as directed., Disp: 60 g, Rfl: 1  Social History   Tobacco Use  Smoking Status Never  Smokeless Tobacco Never    Allergies  Allergen Reactions   Abilify [Aripiprazole] Nausea Only   Objective:  There were no vitals filed for this  visit. There is no height or weight on file to calculate BMI. Constitutional Well developed. Well nourished.  Vascular Dorsalis pedis pulses palpable bilaterally. Posterior tibial pulses palpable bilaterally. Capillary refill normal to all digits.  No cyanosis or clubbing noted. Pedal hair growth normal.  Neurologic Normal speech. Oriented to person, place, and time. Epicritic sensation to light touch grossly present bilaterally.  Positive Tinel's sign noted to the right lower extremity negative Tinel's sign to the left lower extremity.  Positive Tinel's sign at the tarsal tunnel and as well as at the fibular head.  Dermatologic Painful ingrowing nail at medial nail borders of the hallux nail right. No other open wounds. No skin lesions.  Orthopedic: Normal joint ROM without pain or crepitus bilaterally. No visible deformities. No bony tenderness.   Radiographs: None Assessment:   1. Neuritis   2. Numbness and tingling     Plan:  Patient was evaluated and treated and all questions answered.  Nerve compression/numbness tingling right lower extremity -I explained the patient the etiology of nerve compression syndrome and various treatment options were extensively discussed.  Ultimately given that patient has not had a nerve conduction study done since before the hip surgery I believe patient will benefit from a nerve conduction study to further evaluate the positive Tinel's sign to the tarsal tunnel as well as the fibular head for common peroneal nerve.  It is possible that it could be coming from impingement of the sciatic nerve versus the lower back.  I discussed this with patient he states understand would like to further evaluate -He is scheduled for nerve conduction study tomorrow.  I will call him back with the results once I receive them.  Ingrown Nail, right -Clinically healed.  I discussed prevention in extensive detail.  Patient states understanding.  No follow-ups on  file.

## 2021-02-04 ENCOUNTER — Other Ambulatory Visit: Payer: Self-pay | Admitting: Endocrinology

## 2021-02-14 ENCOUNTER — Other Ambulatory Visit: Payer: Self-pay

## 2021-02-14 ENCOUNTER — Ambulatory Visit: Payer: Managed Care, Other (non HMO) | Admitting: Endocrinology

## 2021-02-14 ENCOUNTER — Encounter: Payer: Self-pay | Admitting: Endocrinology

## 2021-02-14 VITALS — BP 98/80 | HR 93 | Ht 69.5 in | Wt 198.4 lb

## 2021-02-14 DIAGNOSIS — I959 Hypotension, unspecified: Secondary | ICD-10-CM | POA: Diagnosis not present

## 2021-02-14 DIAGNOSIS — E201 Pseudohypoparathyroidism: Secondary | ICD-10-CM

## 2021-02-14 DIAGNOSIS — E230 Hypopituitarism: Secondary | ICD-10-CM | POA: Diagnosis not present

## 2021-02-14 DIAGNOSIS — E038 Other specified hypothyroidism: Secondary | ICD-10-CM

## 2021-02-14 LAB — BASIC METABOLIC PANEL
BUN: 8 mg/dL (ref 6–23)
CO2: 30 mEq/L (ref 19–32)
Calcium: 8.9 mg/dL (ref 8.4–10.5)
Chloride: 100 mEq/L (ref 96–112)
Creatinine, Ser: 0.99 mg/dL (ref 0.40–1.50)
GFR: 104.37 mL/min (ref >60.00)
Glucose, Bld: 80 mg/dL (ref 70–99)
Potassium: 3.3 mEq/L — ABNORMAL LOW (ref 3.5–5.1)
Sodium: 140 mEq/L (ref 135–145)

## 2021-02-14 LAB — CORTISOL: Cortisol, Plasma: 7.4 ug/dL

## 2021-02-14 LAB — TESTOSTERONE: Testosterone: 208.68 ng/dL — ABNORMAL LOW (ref 300.00–890.00)

## 2021-02-14 LAB — TSH: TSH: 4.06 u[IU]/mL (ref 0.35–5.50)

## 2021-02-14 LAB — T4, FREE: Free T4: 0.8 ng/dL (ref 0.60–1.60)

## 2021-02-14 MED ORDER — CALCITRIOL 0.5 MCG PO CAPS: ORAL_CAPSULE | ORAL | 1 refills | Status: DC

## 2021-02-14 NOTE — Progress Notes (Addendum)
Patient ID: Kevin Ramos, male   DOB: 06-Nov-1995, 25 y.o.   MRN: [id number]             Chief complaint: Endocrinology follow-up  History of Present Illness:  Problem 1: HYPOCALCEMIA  RECENT history:  Since 4/21 he has been on 0.5 mcg of calcitriol, previously had required larger doses Also takes 2 tablets of 1200 mg calcium once or twice a day  He has taken his regimen very regularly now  Usually has complaints of tingling in the hands or face cramping or stiffness associated with low calcium levels No recurrence of symptoms However he has had some numbness and tingling in his hands and feet for some time  No twitching or cramping in the muscles or stiffness  Labs are pending from today, calcium was normal in August again     Lab Results  Component Value Date   CALCIUM 9.2 11/03/2020   CALCIUM 8.8 08/23/2020   CALCIUM 9.4 07/20/2020    VITAMIN D deficiency: He is taking vitamin D3, 2000 units daily   Lab Results  Component Value Date   VD25OH 20.83 (L) 03/25/2019   VD25OH 30.7 10/29/2017   VD25OH 41.22 12/27/2016   HYPOTHYROIDISM:  He had thyroid functions checked because of fatigue in 12/20 Free T4 was low normal at 0.63 as of 2/21 With his TSH is mildly increased he was told to start taking levothyroxine 25 mcg daily  Previously had symptoms of fatigue and sleepiness His only difficulty is weight gain which continues  He is now on 75 mcg levothyroxine since 9/21 Has taken this more regularly now, was told to take it about 15 minutes before breakfast   Lab Results  Component Value Date   TSH 1.92 07/20/2020   TSH 2.24 05/18/2020   TSH 4.25 12/14/2019   FREET4 0.91 07/20/2020   FREET4 0.91 05/18/2020   FREET4 0.77 02/16/2020   Problem 2: HYPOGONADISM:  Because of his significant fatigue, lack of energy and motivation testosterone was checked and free testosterone was low on 2 occasions LH normal along with prolactin  Previously was  consistently using his 4 mg Androderm and was able to continue despite some scaling irritation He was switched to AndroGel  This was increased to 1 pump on each shoulder which he takes regularly at night after his shower He is not clear if he felt better with increasing the dose on the last visit, still has some nonspecific fatigue   Lab Results  Component Value Date   TESTOSTERONE 260.28 (L) 11/03/2020   TESTOSTERONE 666 08/23/2020   TESTOSTERONE 194.35 (L) 07/20/2020   TESTOSTERONE 426.08 05/18/2020   TESTOSTERONE 196.61 (L) 02/16/2020   Lab Results  Component Value Date   TESTOFREE 6.7 (L) 05/05/2019   TESTOFREE 7.5 (L) 04/30/2019      Past Medical History:  Diagnosis Date   Acne    Depression    Dysplastic nevus 02/09/2019   right upper back, moderate atypia, close to margin.    No past surgical history on file.  Family History  Problem Relation Age of Onset   Hypoparathyroidism Mother     Social History:  reports that he has never smoked. He has never used smokeless tobacco. He reports that he does not drink alcohol and does not use drugs.   Allergies:  Allergies  Allergen Reactions   Abilify [Aripiprazole] Nausea Only    Allergies as of 02/14/2021       Reactions   Abilify [aripiprazole] Nausea Only  Medication List        Accurate as of February 14, 2021  9:49 AM. If you have any questions, ask your nurse or doctor.          acetaminophen 500 MG tablet Commonly known as: TYLENOL Take 500 mg by mouth every 6 (six) hours as needed.   amphetamine-dextroamphetamine 5 MG tablet Commonly known as: Adderall Take 1 tablet (5 mg total) by mouth daily.   azelastine 0.1 % nasal spray Commonly known as: ASTELIN Place into the nose.   calcitRIOL 0.5 MCG capsule Commonly known as: ROCALTROL TAKE TWO CAPSULES BY MOUTH DAILY   CALCIUM 1200 PO Take 2 capsules by mouth daily.   calcium carbonate 1500 (600 Ca) MG Tabs tablet Commonly known  as: OSCAL Take by mouth.   cephALEXin 500 MG capsule Commonly known as: KEFLEX Take 2 capsules (1,000 mg total) by mouth 2 (two) times daily.   cetirizine 10 MG tablet Commonly known as: ZYRTEC Take by mouth.   clindamycin 1 % external solution Commonly known as: CLEOCIN T Apply topically in the morning and at bedtime. scalp   Descovy 200-25 MG tablet Generic drug: emtricitabine-tenofovir AF Take 1 tablet by mouth daily.   doxepin 10 MG capsule Commonly known as: SINEQUAN Take 1 to 2 capsules at bedtime as needed for sleep   doxycycline 100 MG tablet Commonly known as: VIBRA-TABS Take 1 tablet daily with food   DULoxetine 30 MG capsule Commonly known as: Cymbalta Take 2 capsules in the morning and 1 capsule in the evening   fluticasone 50 MCG/ACT nasal spray Commonly known as: FLONASE Place into the nose.   gabapentin 300 MG capsule Commonly known as: NEURONTIN Take by mouth.   hydrocortisone 2.5 % ointment Apply to corner of mouth twice daily for up to 1 week.   levothyroxine 75 MCG tablet Commonly known as: SYNTHROID TAKE ONE TABLET BY MOUTH EVERY MORNING BEFORE BREAKFAST   mupirocin ointment 2 % Commonly known as: BACTROBAN Apply to skin qd-bid   nabumetone 500 MG tablet Commonly known as: RELAFEN Take 500 mg by mouth 2 (two) times daily.   nystatin 100000 UNIT/ML suspension Commonly known as: MYCOSTATIN Take by mouth.   potassium chloride SA 20 MEQ tablet Commonly known as: KLOR-CON Take 1 tablet (20 mEq total) by mouth daily.   pregabalin 75 MG capsule Commonly known as: LYRICA Start with 75mg  capsule at bedtime and if doing well, may increase to twice a day after 5 days   propranolol 10 MG tablet Commonly known as: INDERAL   QUEtiapine 50 MG tablet Commonly known as: SEROQUEL Take 1 tablet (50 mg total) by mouth at bedtime.   Testosterone 25 MG/2.5GM (1%) Gel Apply topically.   triamcinolone ointment 0.1 % Commonly known as:  KENALOG Apply 1 application topically 2 (two) times daily as needed (Rash). Use for up to 2 weeks on affected areas of arms  Avoid applying to face, groin, and axilla. Use as directed.   Vitamin D 50 MCG (2000 UT) tablet Take 2,000 Units by mouth daily.            Review of Systems  Weight gain:   Wt Readings from Last 3 Encounters:  02/14/21 198 lb 6.4 oz (90 kg)  11/03/20 191 lb (86.6 kg)  08/15/20 180 lb (81.6 kg)    He is taking Seroquel now along with duloxetine  Vitamin B12 level has been normal  No symptoms of lightheadedness or nausea, blood pressure is relatively lower  today  BP Readings from Last 3 Encounters:  02/14/21 98/80  11/03/20 118/78  08/15/20 128/88   On Lyrica 75  PHYSICAL EXAM:  BP 98/80 (Patient Position: Standing)   Pulse 93   Ht 5' 9.5" (1.765 m)   Wt 198 lb 6.4 oz (90 kg)   SpO2 94%   BMI 28.88 kg/m    ASSESSMENT:   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  Has been very consistent now with his calcitriol and calcium supplements  No symptoms of hypocalcemia again Likely his symptoms of numbness and tingling in his hands and feet are related to neuropathy  Labs pending  HYPOTHYROIDISM: 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, TSH is relatively higher at times   He has been more regular with his levothyroxine and will check his labs again today   Weight gain: This does not appear to be related to endocrine causes antidepressants Also has not started back on his exercise as yet   HYPOGONADISM: 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  His fatigue is likely multifactorial and unlikely to be from endocrine deficiency at this time Labs to be checked today   PLAN:   Labs will be checked as above and further adjustment in his medication made if needed Otherwise follow-up in 4 months Although he does not have any signs or symptoms  of adrenal insufficiency he is blood pressure is relatively lower today; we will also check cortisol  Will recheck labs on the next visit before his visit at [facility] 02/14/2021, 9:49 AM   Addendum: Testosterone level 209, to increase dose to 3 pumps daily Also restart potassium supplements, calcium okay

## 2021-02-15 ENCOUNTER — Other Ambulatory Visit: Payer: Self-pay | Admitting: Endocrinology

## 2021-02-15 ENCOUNTER — Encounter: Payer: Self-pay | Admitting: Endocrinology

## 2021-02-15 MED ORDER — POTASSIUM CHLORIDE CRYS ER 20 MEQ PO TBCR: 20.0000 meq | EXTENDED_RELEASE_TABLET | Freq: Every day | ORAL | 3 refills | Status: DC

## 2021-02-20 DIAGNOSIS — E781 Pure hyperglyceridemia: Secondary | ICD-10-CM | POA: Insufficient documentation

## 2021-02-20 DIAGNOSIS — E6609 Other obesity due to excess calories: Secondary | ICD-10-CM | POA: Insufficient documentation

## 2021-02-20 DIAGNOSIS — G5793 Unspecified mononeuropathy of bilateral lower limbs: Secondary | ICD-10-CM | POA: Insufficient documentation

## 2021-04-12 ENCOUNTER — Ambulatory Visit (INDEPENDENT_AMBULATORY_CARE_PROVIDER_SITE_OTHER): Payer: Managed Care, Other (non HMO) | Admitting: Endocrinology

## 2021-04-12 ENCOUNTER — Other Ambulatory Visit: Payer: Self-pay

## 2021-04-12 ENCOUNTER — Encounter: Payer: Self-pay | Admitting: Endocrinology

## 2021-04-12 VITALS — BP 122/86 | HR 95 | Ht 69.5 in | Wt 207.0 lb

## 2021-04-12 DIAGNOSIS — E201 Pseudohypoparathyroidism: Secondary | ICD-10-CM | POA: Diagnosis not present

## 2021-04-12 DIAGNOSIS — R635 Abnormal weight gain: Secondary | ICD-10-CM

## 2021-04-12 DIAGNOSIS — E876 Hypokalemia: Secondary | ICD-10-CM

## 2021-04-12 DIAGNOSIS — E038 Other specified hypothyroidism: Secondary | ICD-10-CM

## 2021-04-12 DIAGNOSIS — E230 Hypopituitarism: Secondary | ICD-10-CM

## 2021-04-12 LAB — CBC
HCT: 49 % (ref 39.0–52.0)
Hemoglobin: 16.1 g/dL (ref 13.0–17.0)
MCHC: 32.8 g/dL (ref 30.0–36.0)
MCV: 88.6 fl (ref 78.0–100.0)
Platelets: 200 10*3/uL (ref 150.0–400.0)
RBC: 5.54 Mil/uL (ref 4.22–5.81)
RDW: 15.2 % (ref 11.5–15.5)
WBC: 6.6 10*3/uL (ref 4.0–10.5)

## 2021-04-12 LAB — BASIC METABOLIC PANEL
BUN: 8 mg/dL (ref 6–23)
CO2: 29 mEq/L (ref 19–32)
Calcium: 8.7 mg/dL (ref 8.4–10.5)
Chloride: 99 mEq/L (ref 96–112)
Creatinine, Ser: 0.88 mg/dL (ref 0.40–1.50)
GFR: 117.68 mL/min (ref >60.00)
Glucose, Bld: 94 mg/dL (ref 70–99)
Potassium: 3.3 mEq/L — ABNORMAL LOW (ref 3.5–5.1)
Sodium: 139 mEq/L (ref 135–145)

## 2021-04-12 LAB — TSH: TSH: 3.85 u[IU]/mL (ref 0.35–5.50)

## 2021-04-12 LAB — TESTOSTERONE: Testosterone: 271.44 ng/dL — ABNORMAL LOW (ref 300.00–890.00)

## 2021-04-12 LAB — MAGNESIUM: Magnesium: 2.1 mg/dL (ref 1.5–2.5)

## 2021-04-12 LAB — T4, FREE: Free T4: 0.66 ng/dL (ref 0.60–1.60)

## 2021-04-12 MED ORDER — NATESTO 5.5 MG/ACT NA GEL: NASAL | 2 refills | Status: DC

## 2021-04-12 NOTE — Addendum Note (Signed)
Addended by: Elayne Snare on: 04/12/2021 08:33 AM   Modules accepted: Orders

## 2021-04-12 NOTE — Progress Notes (Signed)
Patient ID: Kevin Ramos, male   DOB: June 20, 1995, 26 y.o.   MRN: [id number]             Chief complaint: Endocrinology follow-up  History of Present Illness:  Problem 1: HYPOCALCEMIA  RECENT history:  Since 4/21 he has been on 0.5 mcg of calcitriol, previously had required larger doses Also takes 2 tablets of 1200 mg calcium once or twice a day  He has taken his regimen very regularly now  Usually has complaints of tingling in the hands or face cramping or stiffness associated with low calcium levels No recurrence of symptoms He tends to have numbness and tingling in his hands and feet for other reasons  No twitching or cramping in the muscles, no weakness are stiffness  Labs are pending from today, calcium was normal on the last 2 visits     Lab Results  Component Value Date   CALCIUM 8.9 02/14/2021   CALCIUM 9.2 11/03/2020   CALCIUM 8.8 08/23/2020    VITAMIN D deficiency: He is taking vitamin D3, 2000 units daily   Lab Results  Component Value Date   VD25OH 20.83 (L) 03/25/2019   VD25OH 30.7 10/29/2017   VD25OH 41.22 12/27/2016   HYPOTHYROIDISM:  He had thyroid functions checked because of fatigue in 12/20 Free T4 was low normal at 0.63 as of 2/21 With his TSH is mildly increased he was told to start taking levothyroxine 25 mcg daily  Previously had symptoms of fatigue and sleepiness This has been present mostly since his COVID infection last month His only difficulty is weight gain which continues  He is now on 75 mcg levothyroxine since 9/21 Free T4 has been fairly stable   Lab Results  Component Value Date   TSH 4.06 02/14/2021   TSH 1.92 07/20/2020   TSH 2.24 05/18/2020   FREET4 0.80 02/14/2021   FREET4 0.91 07/20/2020   FREET4 0.91 05/18/2020   Problem 2: HYPOGONADISM:  Because of his significant fatigue, lack of energy and motivation testosterone was checked and free testosterone was low on 2 occasions LH normal along with  prolactin  Previously was consistently using his 4 mg Androderm and was able to continue despite some scaling irritation He was switched to AndroGel   This was increased to 2 pump on each shoulder which he takes regularly at night after his shower He has not reported any change in his energy level after the dosage change but recently has had fatigue related to other causes   Lab Results  Component Value Date   TESTOSTERONE 208.68 (L) 02/14/2021   TESTOSTERONE 260.28 (L) 11/03/2020   TESTOSTERONE 666 08/23/2020   TESTOSTERONE 194.35 (L) 07/20/2020   TESTOSTERONE 426.08 05/18/2020   Lab Results  Component Value Date   TESTOFREE 6.7 (L) 05/05/2019   TESTOFREE 7.5 (L) 04/30/2019      Past Medical History:  Diagnosis Date   Acne    Depression    Dysplastic nevus 02/09/2019   right upper back, moderate atypia, close to margin.    No past surgical history on file.  Family History  Problem Relation Age of Onset   Hypoparathyroidism Mother     Social History:  reports that he has never smoked. He has never used smokeless tobacco. He reports that he does not drink alcohol and does not use drugs.   Allergies:  Allergies  Allergen Reactions   Abilify [Aripiprazole] Nausea Only    Allergies as of 04/12/2021  Reactions   Abilify [aripiprazole] Nausea Only        Medication List        Accurate as of April 12, 2021  8:25 AM. If you have any questions, ask your nurse or doctor.          acetaminophen 500 MG tablet Commonly known as: TYLENOL Take 500 mg by mouth every 6 (six) hours as needed.   amphetamine-dextroamphetamine 5 MG tablet Commonly known as: Adderall Take 1 tablet (5 mg total) by mouth daily.   azelastine 0.1 % nasal spray Commonly known as: ASTELIN Place into the nose.   calcitRIOL 0.5 MCG capsule Commonly known as: ROCALTROL TAKE 1 CAPSULE BY MOUTH DAILY   CALCIUM 1200 PO Take 2 capsules by mouth daily.   calcium carbonate 1500  (600 Ca) MG Tabs tablet Commonly known as: OSCAL Take by mouth.   cetirizine 10 MG tablet Commonly known as: ZYRTEC Take by mouth.   Descovy 200-25 MG tablet Generic drug: emtricitabine-tenofovir AF Take 1 tablet by mouth daily.   doxepin 10 MG capsule Commonly known as: SINEQUAN Take 1 to 2 capsules at bedtime as needed for sleep   DULoxetine 30 MG capsule Commonly known as: Cymbalta Take 2 capsules in the morning and 1 capsule in the evening   fluticasone 50 MCG/ACT nasal spray Commonly known as: FLONASE Place into the nose.   gabapentin 300 MG capsule Commonly known as: NEURONTIN Take by mouth.   hydrocortisone 2.5 % ointment Apply to corner of mouth twice daily for up to 1 week.   levothyroxine 75 MCG tablet Commonly known as: SYNTHROID TAKE ONE TABLET BY MOUTH EVERY MORNING BEFORE BREAKFAST   mupirocin ointment 2 % Commonly known as: BACTROBAN Apply to skin qd-bid   nabumetone 500 MG tablet Commonly known as: RELAFEN Take 500 mg by mouth 2 (two) times daily.   nystatin 100000 UNIT/ML suspension Commonly known as: MYCOSTATIN Take by mouth.   potassium chloride SA 20 MEQ tablet Commonly known as: KLOR-CON M Take 1 tablet (20 mEq total) by mouth daily.   pregabalin 75 MG capsule Commonly known as: LYRICA Start with 75mg  capsule at bedtime and if doing well, may increase to twice a day after 5 days   propranolol 10 MG tablet Commonly known as: INDERAL   QUEtiapine 50 MG tablet Commonly known as: SEROQUEL Take 1 tablet (50 mg total) by mouth at bedtime.   Testosterone 25 MG/2.5GM (1%) Gel Apply topically.   triamcinolone ointment 0.1 % Commonly known as: KENALOG Apply 1 application topically 2 (two) times daily as needed (Rash). Use for up to 2 weeks on affected areas of arms  Avoid applying to face, groin, and axilla. Use as directed.   Vitamin D 50 MCG (2000 UT) tablet Take 2,000 Units by mouth daily.            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  Has been very consistent with his calcitriol and calcium supplements  No symptoms of hypocalcemia recently Again his symptoms of numbness and tingling in his hands and feet are related to neuropathy  Labs pending  HYPOTHYROIDISM: This is from mixed primary and secondary hypothyroidism, TSH is relatively higher at times  He has been regular with his levothyroxine Free T4 to be checked again especially with his weight gain   HYPOGONADISM: 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 Now taking 4 pumps a day of the AndroGel  His symptoms of fatigue have been previously multifactorial  Labs to be checked today   PLAN:   Labs will be checked as above  If no changes made he will follow-up in 4 months on the same doses  Otherwise follow-up in 4 months  He will discuss his weight gain symptoms with his psychiatrist and also consider reducing Lyrica  Reminded him to recheck labs on the next visit before his visit at [facility] 04/12/2021, 8:25 AM   Addendum: Eston Mould prescription has been sent, this will require prior authorization, currently still has low T levels He will take potassium tablets by crushing them as his level is still low

## 2021-04-13 ENCOUNTER — Other Ambulatory Visit (HOSPITAL_COMMUNITY): Payer: Self-pay

## 2021-04-13 ENCOUNTER — Telehealth: Payer: Self-pay

## 2021-04-13 NOTE — Telephone Encounter (Signed)
Patient Advocate Encounter   Received notification from [facility] that prior authorization for Natesto 5.5mg  gel is required by his/her insurance [employer].   PA submitted on 04/13/21  Key#: [id number]  Status is pending    [facility] will continue to follow:  Patient Advocate Fax: [phone]

## 2021-04-17 ENCOUNTER — Encounter: Payer: Self-pay | Admitting: Endocrinology

## 2021-05-01 ENCOUNTER — Other Ambulatory Visit (HOSPITAL_COMMUNITY): Payer: Self-pay

## 2021-05-01 NOTE — Telephone Encounter (Signed)
Patient Advocate Encounter  Received notification from Christella Scheuermann that the request for prior authorization for Natesto gel has been denied due to no lab results of low testosterone.    This determination is currently being appealed.  The appeal was faxed to [phone].   This encounter will continue to be updated until final determination.     Specialty Pharmacy Patient Advocate Fax: [phone]

## 2021-05-03 ENCOUNTER — Other Ambulatory Visit (HOSPITAL_COMMUNITY): Payer: Self-pay

## 2021-05-04 ENCOUNTER — Other Ambulatory Visit (HOSPITAL_COMMUNITY): Payer: Self-pay

## 2021-05-04 NOTE — Telephone Encounter (Signed)
Any update on PA?

## 2021-05-05 ENCOUNTER — Other Ambulatory Visit (HOSPITAL_COMMUNITY): Payer: Self-pay

## 2021-05-08 ENCOUNTER — Other Ambulatory Visit: Payer: Self-pay

## 2021-05-08 ENCOUNTER — Ambulatory Visit: Payer: Managed Care, Other (non HMO) | Admitting: Dermatology

## 2021-05-08 ENCOUNTER — Other Ambulatory Visit (HOSPITAL_COMMUNITY): Payer: Self-pay

## 2021-05-08 DIAGNOSIS — L814 Other melanin hyperpigmentation: Secondary | ICD-10-CM

## 2021-05-08 DIAGNOSIS — L700 Acne vulgaris: Secondary | ICD-10-CM | POA: Diagnosis not present

## 2021-05-08 DIAGNOSIS — D229 Melanocytic nevi, unspecified: Secondary | ICD-10-CM

## 2021-05-08 DIAGNOSIS — D1800 Hemangioma unspecified site: Secondary | ICD-10-CM

## 2021-05-08 DIAGNOSIS — Q825 Congenital non-neoplastic nevus: Secondary | ICD-10-CM | POA: Diagnosis not present

## 2021-05-08 DIAGNOSIS — L813 Cafe au lait spots: Secondary | ICD-10-CM

## 2021-05-08 DIAGNOSIS — Z1283 Encounter for screening for malignant neoplasm of skin: Secondary | ICD-10-CM

## 2021-05-08 DIAGNOSIS — L578 Other skin changes due to chronic exposure to nonionizing radiation: Secondary | ICD-10-CM | POA: Diagnosis not present

## 2021-05-08 DIAGNOSIS — L821 Other seborrheic keratosis: Secondary | ICD-10-CM

## 2021-05-08 NOTE — Patient Instructions (Signed)

## 2021-05-08 NOTE — Progress Notes (Signed)
° °  Follow-Up Visit   Subjective  Kevin Ramos is a 26 y.o. male who presents for the following: Annual Exam (Hx dysplastic nevus on the R upper back. Hx acne S/P 3 courses of Isotretinoin. ). The patient presents for Total-Body Skin Exam (TBSE) for skin cancer screening and mole check.  The patient has spots, moles and lesions to be evaluated, some may be new or changing.  The following portions of the chart were reviewed this encounter and updated as appropriate:   Tobacco   Allergies   Meds   Problems   Med Hx   Surg Hx   Fam Hx      Review of Systems:  No other skin or systemic complaints except as noted in HPI or Assessment and Plan.  Objective  Well appearing patient in no apparent distress; mood and affect are within normal limits.  A full examination was performed including scalp, head, eyes, ears, nose, lips, neck, chest, axillae, abdomen, back, buttocks, bilateral upper extremities, bilateral lower extremities, hands, feet, fingers, toes, fingernails, and toenails. All findings within normal limits unless otherwise noted below.  Face Some scarring on the face, chest, and shoulders.   Assessment & Plan  Acne vulgaris Face S/P Isotretinoin x 3 courses - Clear other than scarring.   Vascular birthmark L ant thigh Benign-appearing.  Observation.  Call clinic for new or changing lesions.  Recommend daily use of broad spectrum spf 30+ sunscreen to sun-exposed areas.   Lentigines - Scattered tan macules - Due to sun exposure - Benign-appearing, observe - Recommend daily broad spectrum sunscreen SPF 30+ to sun-exposed areas, reapply every 2 hours as needed. - Call for any changes  Seborrheic Keratoses - Stuck-on, waxy, tan-brown papules and/or plaques  - Benign-appearing - Discussed benign etiology and prognosis. - Observe - Call for any changes  Melanocytic Nevi - Tan-brown and/or pink-flesh-colored symmetric macules and papules - Benign appearing on exam  today - Observation - Call clinic for new or changing moles - Recommend daily use of broad spectrum spf 30+ sunscreen to sun-exposed areas.   Hemangiomas - Red papules - Discussed benign nature - Observe - Call for any changes  Actinic Damage - Chronic condition, secondary to cumulative UV/sun exposure - diffuse scaly erythematous macules with underlying dyspigmentation - Recommend daily broad spectrum sunscreen SPF 30+ to sun-exposed areas, reapply every 2 hours as needed.  - Staying in the shade or wearing long sleeves, sun glasses (UVA+UVB protection) and wide brim hats (4-inch brim around the entire circumference of the hat) are also recommended for sun protection.  - Call for new or changing lesions.  History of Dysplastic Nevus - R upper back  - No evidence of recurrence today - Recommend regular full body skin exams - Recommend daily broad spectrum sunscreen SPF 30+ to sun-exposed areas, reapply every 2 hours as needed.  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  Return for TBSE in 1-2 years.  [name], CMA, am acting as scribe for Sarina Ser, MD . Documentation: I have reviewed the above documentation for accuracy and completeness, and I agree with the above.  Sarina Ser, MD

## 2021-05-08 NOTE — Telephone Encounter (Signed)
Patient Advocate Encounter  The request for prior authorization appeal for Natesto gel has been denied due to the patient not trying all three meds.: Testosterone 30 mg tabs., Androgel 1% & Androgel 1.62% gel.  The denial letter is being mailed to pt & provider.   This encounter will continue to be updated until final determination.    Specialty Pharmacy Patient Advocate Fax:  [phone]

## 2021-05-09 ENCOUNTER — Telehealth: Payer: Self-pay

## 2021-05-09 NOTE — Telephone Encounter (Signed)
Patient Advocate Encounter   Received notification from patient calls that prior authorization for Jatenzo 198mg  capsules is required by his/her insurance [employer].   PA submitted on 05/09/21  Key#: [id number]  Status is pending    [facility] will continue to follow:  Patient Advocate Fax: [phone]

## 2021-05-12 ENCOUNTER — Other Ambulatory Visit (HOSPITAL_COMMUNITY): Payer: Self-pay

## 2021-05-12 ENCOUNTER — Encounter: Payer: Self-pay | Admitting: Dermatology

## 2021-05-17 ENCOUNTER — Other Ambulatory Visit: Payer: Self-pay

## 2021-05-17 DIAGNOSIS — R7989 Other specified abnormal findings of blood chemistry: Secondary | ICD-10-CM

## 2021-05-17 MED ORDER — POTASSIUM CHLORIDE CRYS ER 20 MEQ PO TBCR: 20.0000 meq | EXTENDED_RELEASE_TABLET | Freq: Every day | ORAL | 3 refills | Status: DC

## 2021-05-23 ENCOUNTER — Other Ambulatory Visit: Payer: Self-pay | Admitting: Endocrinology

## 2021-05-23 ENCOUNTER — Other Ambulatory Visit (HOSPITAL_COMMUNITY): Payer: Self-pay

## 2021-05-23 MED ORDER — JATENZO 198 MG PO CAPS: 1.0000 | ORAL_CAPSULE | Freq: Two times a day (BID) | ORAL | 3 refills | Status: DC

## 2021-05-23 NOTE — Telephone Encounter (Addendum)
Patient Advocate Encounter  Prior Authorization for [employer] 198mg  caps. has been approved.    PA# [id number]  Effective dates: 05/09/21 through 05/22/22  Copay is $10.00  PLEASE SEND SCRIPT TO PATIENTS PHARMACY TO FILL  Patient Advocate Fax: [phone]

## 2021-05-29 ENCOUNTER — Other Ambulatory Visit: Payer: Self-pay | Admitting: Endocrinology

## 2021-05-29 MED ORDER — JATENZO 198 MG PO CAPS: 1.0000 | ORAL_CAPSULE | Freq: Two times a day (BID) | ORAL | 3 refills | Status: DC

## 2021-05-30 ENCOUNTER — Other Ambulatory Visit: Payer: Self-pay

## 2021-06-07 ENCOUNTER — Other Ambulatory Visit: Payer: Self-pay | Admitting: Endocrinology

## 2021-06-07 MED ORDER — JATENZO 198 MG PO CAPS: 1.0000 | ORAL_CAPSULE | Freq: Two times a day (BID) | ORAL | 3 refills | Status: DC

## 2021-06-08 ENCOUNTER — Encounter: Payer: Self-pay | Admitting: Endocrinology

## 2021-06-09 ENCOUNTER — Other Ambulatory Visit: Payer: Self-pay | Admitting: Endocrinology

## 2021-06-12 LAB — TESTOSTERONE, TOTAL, LC/MS/MS: Testosterone, total: 227.1 ng/dL — ABNORMAL LOW (ref 264.0–916.0)

## 2021-06-15 ENCOUNTER — Ambulatory Visit: Payer: Managed Care, Other (non HMO) | Admitting: Endocrinology

## 2021-06-16 ENCOUNTER — Other Ambulatory Visit: Payer: Self-pay | Admitting: Endocrinology

## 2021-06-16 DIAGNOSIS — R7989 Other specified abnormal findings of blood chemistry: Secondary | ICD-10-CM

## 2021-06-20 ENCOUNTER — Ambulatory Visit: Payer: Managed Care, Other (non HMO) | Admitting: Endocrinology

## 2021-07-19 ENCOUNTER — Telehealth: Payer: Self-pay

## 2021-07-19 NOTE — Telephone Encounter (Signed)
Patient called in states that the [facility] that you put him on is not helping him. Patient still feels tired and has no energy. Wants to know if maybe he can have labs done and move his appt up? Please advise ?

## 2021-07-20 ENCOUNTER — Other Ambulatory Visit: Payer: Self-pay | Admitting: Endocrinology

## 2021-07-20 DIAGNOSIS — E038 Other specified hypothyroidism: Secondary | ICD-10-CM

## 2021-07-20 DIAGNOSIS — E201 Pseudohypoparathyroidism: Secondary | ICD-10-CM

## 2021-07-20 DIAGNOSIS — E230 Hypopituitarism: Secondary | ICD-10-CM

## 2021-07-20 DIAGNOSIS — R200 Anesthesia of skin: Secondary | ICD-10-CM

## 2021-07-20 DIAGNOSIS — M792 Neuralgia and neuritis, unspecified: Secondary | ICD-10-CM

## 2021-07-25 ENCOUNTER — Other Ambulatory Visit: Payer: Self-pay

## 2021-07-25 DIAGNOSIS — E201 Pseudohypoparathyroidism: Secondary | ICD-10-CM

## 2021-07-25 DIAGNOSIS — E038 Other specified hypothyroidism: Secondary | ICD-10-CM

## 2021-07-25 DIAGNOSIS — E230 Hypopituitarism: Secondary | ICD-10-CM

## 2021-07-26 ENCOUNTER — Other Ambulatory Visit (HOSPITAL_COMMUNITY): Payer: Self-pay

## 2021-07-26 ENCOUNTER — Telehealth: Payer: Self-pay | Admitting: Endocrinology

## 2021-07-26 ENCOUNTER — Ambulatory Visit: Payer: Managed Care, Other (non HMO) | Admitting: Endocrinology

## 2021-07-26 ENCOUNTER — Telehealth: Payer: Self-pay | Admitting: Pharmacy Technician

## 2021-07-26 ENCOUNTER — Encounter: Payer: Self-pay | Admitting: Endocrinology

## 2021-07-26 VITALS — BP 118/84 | HR 86 | Ht 69.5 in | Wt 207.0 lb

## 2021-07-26 DIAGNOSIS — E876 Hypokalemia: Secondary | ICD-10-CM

## 2021-07-26 DIAGNOSIS — E230 Hypopituitarism: Secondary | ICD-10-CM | POA: Diagnosis not present

## 2021-07-26 DIAGNOSIS — E201 Pseudohypoparathyroidism: Secondary | ICD-10-CM | POA: Diagnosis not present

## 2021-07-26 DIAGNOSIS — D751 Secondary polycythemia: Secondary | ICD-10-CM

## 2021-07-26 DIAGNOSIS — E038 Other specified hypothyroidism: Secondary | ICD-10-CM

## 2021-07-26 LAB — CBC
Hematocrit: 52.6 % — ABNORMAL HIGH (ref 37.5–51.0)
Hemoglobin: 18 g/dL — ABNORMAL HIGH (ref 13.0–17.7)
MCH: 29.3 pg (ref 26.6–33.0)
MCHC: 34.2 g/dL (ref 31.5–35.7)
MCV: 86 fL (ref 79–97)
Platelets: 250 10*3/uL (ref 150–450)
RBC: 6.14 x10E6/uL — ABNORMAL HIGH (ref 4.14–5.80)
RDW: 13.4 % (ref 11.6–15.4)
WBC: 8.4 10*3/uL (ref 3.4–10.8)

## 2021-07-26 LAB — TESTOSTERONE: Testosterone: 262 ng/dL — ABNORMAL LOW (ref 264–916)

## 2021-07-26 LAB — BASIC METABOLIC PANEL
BUN/Creatinine Ratio: 8 — ABNORMAL LOW (ref 9–20)
BUN: 8 mg/dL (ref 6–20)
CO2: 23 mmol/L (ref 20–29)
Calcium: 9.3 mg/dL (ref 8.7–10.2)
Chloride: 96 mmol/L (ref 96–106)
Creatinine, Ser: 1.02 mg/dL (ref 0.76–1.27)
Glucose: 102 mg/dL — ABNORMAL HIGH (ref 70–99)
Potassium: 3.5 mmol/L (ref 3.5–5.2)
Sodium: 143 mmol/L (ref 134–144)
eGFR: 103 mL/min/{1.73_m2} (ref >59)

## 2021-07-26 LAB — T4, FREE: Free T4: 1.22 ng/dL (ref 0.82–1.77)

## 2021-07-26 MED ORDER — NATESTO 5.5 MG/ACT NA GEL: NASAL | 2 refills | Status: DC

## 2021-07-26 NOTE — Telephone Encounter (Signed)
Patient Advocate Encounter ?  ?Received notification that prior authorization for Natesto 5.'5mg'$ /act Gel (Testosterone) is required. ?  ?PA submitted on 07/26/2021 ?Key [id number] ?Status is pending ?   ? ? ? ?Lyndel Safe, CPhT ?Pharmacy Patient Advocate Specialist ?[facility] Patient Advocate Team ?Direct Number: [phone]  Fax: [phone] ? ?

## 2021-07-26 NOTE — Telephone Encounter (Signed)
He has failed a trial of Jatenzo because of low testosterone level and excessive rise in hemoglobin.  Need to prior authorize Natesto which will not increase RBC count thanks ?

## 2021-07-26 NOTE — Progress Notes (Signed)
Patient ID: Kevin Ramos, male   DOB: Aug 13, 1995, 26 y.o.   MRN: [id number]  ? ?       ? ? ? ?Chief complaint: Endocrinology follow-up ? ?History of Present Illness: ? ?Problem 1: HYPOCALCEMIA ? ?RECENT history: ? ?Since 4/21 he has been on 0.5 mcg of calcitriol, previously had required larger doses ?Also takes 2 tablets of 1200 mg calcium once or twice a day  ?He has taken his regimen very regularly as prescribed ? ?Usually has complaints of tingling in the hands or face cramping or stiffness associated with low calcium levels ?No recurrence of symptoms ?He tends to have numbness and tingling in his hands and feet periodically, this is unrelated ? ? ?No twitching or cramping in the muscles as well as no stiffness ?Calcium consistently normal ?  ?Lab Results  ?Component Value Date  ? CALCIUM 9.3 07/25/2021  ? CALCIUM 8.7 04/12/2021  ? CALCIUM 8.9 02/14/2021  ? ? ?VITAMIN D deficiency: He is taking vitamin D3, 2000 units daily ? ? ?Lab Results  ?Component Value Date  ? VD25OH 20.83 (L) 03/25/2019  ? VD25OH 30.7 10/29/2017  ? VD25OH 41.22 12/27/2016  ? ?HYPOTHYROIDISM: ? ?He had thyroid functions checked because of fatigue in 12/20 ?Free T4 was low normal at 0.63 as of 2/21 ?With his TSH is mildly increased he was told to start taking levothyroxine 25 mcg daily ? ?Previously had symptoms of fatigue and sleepiness ?This has been present mostly since his COVID infection last month ?His only difficulty is weight gain which continues ? ?He is now on 75 mcg levothyroxine since 9/21 ?Free T4 has improved ? ? ?Lab Results  ?Component Value Date  ? TSH 3.85 04/12/2021  ? TSH 4.06 02/14/2021  ? TSH 1.92 07/20/2020  ? FREET4 1.22 07/25/2021  ? FREET4 0.66 04/12/2021  ? FREET4 0.80 02/14/2021  ? ?Problem 2: HYPOGONADISM: ? ?Because of his significant fatigue, lack of energy and motivation testosterone was checked and free testosterone was low on 2 occasions ?LH normal along with prolactin ? ?Previously was consistently  using his 4 mg Androderm and was able to continue despite some scaling irritation ?He was switched to AndroGel  ? ?Because of continued fatigue and low testosterone levels persistently with AndroGel higher doses he was switched to Jatenzo 198 mg twice a day about 6 weeks ago, he takes this after breakfast and dinner ?He still complains of fatigue ?Although his testosterone level is minimally higher than last month it is still low, checked about 6 hours after his medication ?His hemoglobin is above normal ? ? ?Lab Results  ?Component Value Date  ? TESTOSTERONE 262 (L) 07/25/2021  ? TESTOSTERONE 227.1 (L) 06/09/2021  ? TESTOSTERONE 271.44 (L) 04/12/2021  ? TESTOSTERONE 208.68 (L) 02/14/2021  ? TESTOSTERONE 260.28 (L) 11/03/2020  ? ?Lab Results  ?Component Value Date  ? TESTOFREE 6.7 (L) 05/05/2019  ? TESTOFREE 7.5 (L) 04/30/2019  ? ?Lab Results  ?Component Value Date  ? HGB 18.0 (H) 07/25/2021  ? ? ? ? ?Past Medical History:  ?Diagnosis Date  ? Acne   ? Depression   ? Dysplastic nevus 02/09/2019  ? right upper back, moderate atypia, close to margin.  ? ? ?No past surgical history on file. ? ?Family History  ?Problem Relation Age of Onset  ? Hypoparathyroidism Mother   ? ? ?Social History:  reports that he has never smoked. He has never used smokeless tobacco. He reports that he does not drink alcohol and does not  use drugs. ? ? ?Allergies:  ?Allergies  ?Allergen Reactions  ? Abilify [Aripiprazole] Nausea Only  ? ? ?Allergies as of 07/26/2021   ? ?   Reactions  ? Abilify [aripiprazole] Nausea Only  ? ?  ? ?  ?Medication List  ?  ? ?  ? Accurate as of Jul 26, 2021  8:45 AM. If you have any questions, ask your nurse or doctor.  ?  ?  ? ?  ? ?acetaminophen 500 MG tablet ?Commonly known as: TYLENOL ?Take 500 mg by mouth every 6 (six) hours as needed. ?  ?amphetamine-dextroamphetamine 5 MG tablet ?Commonly known as: Adderall ?Take 1 tablet (5 mg total) by mouth daily. ?  ?asenapine 5 MG Subl 24 hr tablet ?Commonly known as:  SAPHRIS ?Place under the tongue. ?  ?azelastine 0.1 % nasal spray ?Commonly known as: ASTELIN ?Place into the nose. ?  ?calcitRIOL 0.5 MCG capsule ?Commonly known as: ROCALTROL ?TAKE 1 CAPSULE BY MOUTH DAILY ?  ?CALCIUM 1200 PO ?Take 2 capsules by mouth daily. ?  ?calcium carbonate 1500 (600 Ca) MG Tabs tablet ?Commonly known as: OSCAL ?Take by mouth. ?  ?cetirizine 10 MG tablet ?Commonly known as: ZYRTEC ?Take by mouth. ?  ?cyanocobalamin 1000 MCG tablet ?Take by mouth. ?  ?Descovy 200-25 MG tablet ?Generic drug: emtricitabine-tenofovir AF ?Take 1 tablet by mouth daily. ?  ?DULoxetine 30 MG capsule ?Commonly known as: Cymbalta ?Take 2 capsules in the morning and 1 capsule in the evening ?  ?fluticasone 50 MCG/ACT nasal spray ?Commonly known as: FLONASE ?Place into the nose. ?  ?hydrocortisone 2.5 % ointment ?Apply to corner of mouth twice daily for up to 1 week. ?  ?Jatenzo 198 [place] ?Generic drug: Testosterone Undecanoate ?Take 1 capsule by mouth 2 (two) times daily after a meal. ?  ?Klor-Con M20 20 MEQ tablet ?Generic drug: potassium chloride SA ?TAKE 1 TABLET BY MOUTH EVERY DAY ?  ?levothyroxine 75 MCG tablet ?Commonly known as: SYNTHROID ?TAKE ONE TABLET BY MOUTH EVERY MORNING BEFORE BREAKFAST ?  ?lidocaine 5 % ?Commonly known as: LIDODERM ?1 patch daily. ?  ?mupirocin ointment 2 % ?Commonly known as: BACTROBAN ?Apply to skin qd-bid ?  ?nabumetone 500 MG tablet ?Commonly known as: RELAFEN ?Take 500 mg by mouth 2 (two) times daily. ?  ?nystatin 100000 UNIT/ML suspension ?Commonly known as: MYCOSTATIN ?Take by mouth. ?  ?pregabalin 75 MG capsule ?Commonly known as: LYRICA ?Start with '75mg'$  capsule at bedtime and if doing well, may increase to twice a day after 5 days ?  ?triamcinolone ointment 0.1 % ?Commonly known as: KENALOG ?Apply 1 application topically 2 (two) times daily as needed (Rash). Use for up to 2 weeks on affected areas of arms  Avoid applying to face, groin, and axilla. Use as directed. ?   ?Vitamin D 50 MCG (2000 UT) tablet ?Take 2,000 Units by mouth daily. ?  ? ?  ? ? ?   ? ?Review of Systems ? ?Weight gain: Recently stable ? ?Wt Readings from Last 3 Encounters:  ?07/26/21 207 lb (93.9 kg)  ?04/12/21 207 lb (93.9 kg)  ?02/14/21 198 lb 6.4 oz (90 kg)  ? ? ? ?Vitamin B12 level has been normal ? ?Blood pressure readings are variable ? ?BP Readings from Last 3 Encounters:  ?07/26/21 118/84  ?04/12/21 122/86  ?02/14/21 98/80  ? ? ?PHYSICAL EXAM: ? ?BP 118/84   Pulse 86   Ht 5' 9.5" (1.765 m)   Wt 207 lb (93.9 kg)  SpO2 95%   BMI 30.13 kg/m?  ? ? ?ASSESSMENT:  ? 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 ?Has been very regular with his calcitriol 0.5 mcg and calcium supplements  ?No symptoms of hypocalcemia recently ?Calcium is consistently normal and now 9.3 ? ?HYPOTHYROIDISM: This is from mixed primary and secondary hypothyroidism, TSH is relatively higher at times ? ?He has been regular with his levothyroxine ?Free T4 is in the therapeutic range and improved with 75 mcg dose which he will continue ? ? 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 ?Since his levels were low even with taking 4 pumps a day of the AndroGel he is taking Jatenzo 198 mg twice a day recently ?He still has significant fatigue and his testosterone level is still low in the 200+ range ?However hemoglobin has gone up excessively ? ?Hypokalemia, improved with potassium supplements ? ?PLAN:  ? ?No change in calcitriol or calcium ?Continue same dose of levothyroxine ?NATESTO nasal gel twice a day as this is the only product that will not raise his hemoglobin and likely will get a more therapeutic ? ?Elayne Snare ?07/26/2021, 8:45 AM  ? ? ? ?

## 2021-07-30 ENCOUNTER — Encounter: Payer: Self-pay | Admitting: Endocrinology

## 2021-08-08 ENCOUNTER — Other Ambulatory Visit (HOSPITAL_COMMUNITY): Payer: Self-pay

## 2021-08-11 ENCOUNTER — Other Ambulatory Visit (HOSPITAL_COMMUNITY): Payer: Self-pay

## 2021-08-11 NOTE — Telephone Encounter (Signed)
Called the insurance, and after an hour and a half hold, due to backlog they were still in clinical review status. I asked for it to be changed to expedited, and they are reviewing now. However, they will call me back with determination. Will continue to follow.

## 2021-08-11 NOTE — Telephone Encounter (Signed)
Patient has been notified

## 2021-08-11 NOTE — Telephone Encounter (Signed)
Any update on PA?

## 2021-08-11 NOTE — Telephone Encounter (Signed)
Called and notified patient that Eston Mould has been approved.

## 2021-08-11 NOTE — Telephone Encounter (Signed)
Received notification from [employer] regarding a prior authorization for NATESTO 5.'5MG'$ . Authorization has been APPROVED from 5.3.23 to 5.18.24.   Per test claim, copay for 30 days supply is $50   Authorization # [id number]

## 2021-09-20 ENCOUNTER — Encounter: Payer: Self-pay | Admitting: Endocrinology

## 2021-09-22 ENCOUNTER — Other Ambulatory Visit: Payer: Self-pay

## 2021-09-22 DIAGNOSIS — E230 Hypopituitarism: Secondary | ICD-10-CM

## 2021-09-22 DIAGNOSIS — D751 Secondary polycythemia: Secondary | ICD-10-CM

## 2021-09-26 LAB — CBC
Hematocrit: 46.5 % (ref 37.5–51.0)
Hemoglobin: 16.5 g/dL (ref 13.0–17.7)
MCH: 29.5 pg (ref 26.6–33.0)
MCHC: 35.5 g/dL (ref 31.5–35.7)
MCV: 83 fL (ref 79–97)
Platelets: 233 10*3/uL (ref 150–450)
RBC: 5.59 x10E6/uL (ref 4.14–5.80)
RDW: 13.9 % (ref 11.6–15.4)
WBC: 6.7 10*3/uL (ref 3.4–10.8)

## 2021-09-26 LAB — TESTOSTERONE: Testosterone: 162 ng/dL — ABNORMAL LOW (ref 264–916)

## 2021-09-27 ENCOUNTER — Other Ambulatory Visit: Payer: Self-pay

## 2021-09-27 ENCOUNTER — Encounter: Payer: Self-pay | Admitting: Endocrinology

## 2021-09-27 ENCOUNTER — Ambulatory Visit (INDEPENDENT_AMBULATORY_CARE_PROVIDER_SITE_OTHER): Payer: Managed Care, Other (non HMO) | Admitting: Endocrinology

## 2021-09-27 VITALS — BP 112/84 | HR 94 | Ht 69.5 in | Wt 205.0 lb

## 2021-09-27 DIAGNOSIS — E230 Hypopituitarism: Secondary | ICD-10-CM | POA: Diagnosis not present

## 2021-09-27 MED ORDER — ANDRODERM 4 MG/24HR TD PT24: 1.0000 | MEDICATED_PATCH | Freq: Every day | TRANSDERMAL | 3 refills | Status: DC

## 2021-09-27 NOTE — Progress Notes (Signed)
Patient ID: Kevin Ramos, male   DOB: [birth year], 26 y.o.   MRN: [id number]             Chief complaint: Endocrinology follow-up  History of Present Illness:  Problem 1: HYPOCALCEMIA  RECENT history:  Since 4/21 he has been on 0.5 mcg of calcitriol, previously had required larger doses Also takes 2 tablets of 1200 mg calcium once or twice a day  He has taken his regimen very regularly as prescribed  Usually has complaints of tingling in the hands or face cramping or stiffness associated with low calcium levels No recurrence of symptoms He tends to have numbness and tingling in his hands and feet periodically, this is unrelated  No twitching or cramping in the muscles as well as no stiffness Calcium consistently normal   Lab Results  Component Value Date   CALCIUM 9.3 07/25/2021   CALCIUM 8.7 04/12/2021   CALCIUM 8.9 02/14/2021    VITAMIN D deficiency: He is taking vitamin D3, 2000 units daily   Lab Results  Component Value Date   VD25OH 20.83 (L) 03/25/2019   VD25OH 30.7 10/29/2017   VD25OH 41.22 12/27/2016   HYPOTHYROIDISM:  He had thyroid functions checked because of fatigue in 12/20 Free T4 was low normal at 0.63 as of 2/21 With his TSH is mildly increased he was told to start taking levothyroxine 25 mcg daily  Previously had symptoms of fatigue and sleepiness This has been present mostly since his COVID infection last month His only difficulty is weight gain which continues  He is now on 75 mcg levothyroxine since 9/21 Free T4 last normal   Lab Results  Component Value Date   TSH 3.85 04/12/2021   TSH 4.06 02/14/2021   TSH 1.92 07/20/2020   FREET4 1.22 07/25/2021   FREET4 0.66 04/12/2021   FREET4 0.80 02/14/2021   Problem 2: HYPOGONADISM:  Because of his significant fatigue, lack of energy and motivation testosterone was checked and free testosterone was low on 2 occasions LH normal along with prolactin  Previously was consistently using  his 4 mg Androderm and was able to continue despite some scaling irritation He was switched to AndroGel because of insurance preference  Because of continued fatigue and low testosterone levels persistently with AndroGel higher doses he was switched to Jatenzo 198 mg twice a day but with this his hemoglobin went up above normal despite testosterone level being only 262  He is now using Natesto for the last 2 months However he says this gives him recurrent sinus infections He still feels fatigued and has no energy level He is usually taking this 3 times a day However his testosterone level is only 162 about 12 hours after last dose the night before His hemoglobin is normal   Lab Results  Component Value Date   TESTOSTERONE 162 (L) 09/25/2021   TESTOSTERONE 262 (L) 07/25/2021   TESTOSTERONE 227.1 (L) 06/09/2021   TESTOSTERONE 271.44 (L) 04/12/2021   TESTOSTERONE 208.68 (L) 02/14/2021   Lab Results  Component Value Date   TESTOFREE 6.7 (L) 05/05/2019   TESTOFREE 7.5 (L) 04/30/2019   Lab Results  Component Value Date   HGB 16.5 09/25/2021      Past Medical History:  Diagnosis Date   Acne    Depression    Dysplastic nevus 02/09/2019   right upper back, moderate atypia, close to margin.    No past surgical history on file.  Family History  Problem Relation Age of Onset  Hypoparathyroidism Mother     Social History:  reports that he has never smoked. He has never used smokeless tobacco. He reports that he does not drink alcohol and does not use drugs.   Allergies:  Allergies  Allergen Reactions   Abilify [Aripiprazole] Nausea Only    Allergies as of 09/27/2021       Reactions   Abilify [aripiprazole] Nausea Only        Medication List        Accurate as of September 27, 2021  8:38 AM. If you have any questions, ask your nurse or doctor.          acetaminophen 500 MG tablet Commonly known as: TYLENOL Take 500 mg by mouth every 6 (six) hours as needed.    amphetamine-dextroamphetamine 5 MG tablet Commonly known as: Adderall Take 1 tablet (5 mg total) by mouth daily.   asenapine 5 MG Subl 24 hr tablet Commonly known as: [place] under the tongue.   azelastine 0.1 % nasal spray Commonly known as: ASTELIN Place into the nose.   calcitRIOL 0.5 MCG capsule Commonly known as: ROCALTROL TAKE 1 CAPSULE BY MOUTH DAILY   CALCIUM 1200 PO Take 2 capsules by mouth daily.   calcium carbonate 1500 (600 Ca) MG Tabs tablet Commonly known as: OSCAL Take by mouth.   cetirizine 10 MG tablet Commonly known as: ZYRTEC Take by mouth.   cyanocobalamin 1000 MCG tablet Take by mouth.   Descovy 200-25 MG tablet Generic drug: emtricitabine-tenofovir AF Take 1 tablet by mouth daily.   DULoxetine 30 MG capsule Commonly known as: Cymbalta Take 2 capsules in the morning and 1 capsule in the evening   fluticasone 50 MCG/ACT nasal spray Commonly known as: FLONASE Place into the nose.   hydrocortisone 2.5 % ointment Apply to corner of mouth twice daily for up to 1 week.   Klor-Con M20 20 MEQ tablet Generic drug: potassium chloride SA TAKE 1 TABLET BY MOUTH EVERY DAY   levothyroxine 75 MCG tablet Commonly known as: SYNTHROID TAKE ONE TABLET BY MOUTH EVERY MORNING BEFORE BREAKFAST   lidocaine 5 % Commonly known as: LIDODERM 1 patch daily.   mupirocin ointment 2 % Commonly known as: BACTROBAN Apply to skin qd-bid   nabumetone 500 MG tablet Commonly known as: RELAFEN Take 500 mg by mouth 2 (two) times daily.   Natesto 5.5 MG/ACT Gel Generic drug: Testosterone Apply 1 squirt of pump in each nostril 3 times a day   nystatin 100000 UNIT/ML suspension Commonly known as: MYCOSTATIN Take by mouth.   pregabalin 75 MG capsule Commonly known as: LYRICA Start with '75mg'$  capsule at bedtime and if doing well, may increase to twice a day after 5 days   triamcinolone ointment 0.1 % Commonly known as: KENALOG Apply 1 application  topically 2 (two) times daily as needed (Rash). Use for up to 2 weeks on affected areas of arms  Avoid applying to face, groin, and axilla. Use as directed.   Vitamin D 50 MCG (2000 UT) tablet Take 2,000 Units by mouth daily.            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

## 2021-09-27 NOTE — Patient Instructions (Signed)
Start Androderm

## 2021-09-28 ENCOUNTER — Encounter: Payer: Self-pay | Admitting: Endocrinology

## 2021-09-29 ENCOUNTER — Other Ambulatory Visit: Payer: Self-pay | Admitting: Endocrinology

## 2021-09-29 MED ORDER — JATENZO 198 MG PO CAPS
ORAL_CAPSULE | ORAL | 5 refills | Status: DC
Start: 2021-09-29 — End: 2022-04-04

## 2021-10-11 ENCOUNTER — Encounter: Payer: Self-pay | Admitting: Endocrinology

## 2021-11-14 ENCOUNTER — Ambulatory Visit: Payer: Managed Care, Other (non HMO) | Admitting: Podiatry

## 2021-11-14 DIAGNOSIS — M722 Plantar fascial fibromatosis: Secondary | ICD-10-CM

## 2021-11-14 DIAGNOSIS — M21861 Other specified acquired deformities of right lower leg: Secondary | ICD-10-CM | POA: Diagnosis not present

## 2021-11-20 ENCOUNTER — Telehealth: Payer: Self-pay

## 2021-11-20 ENCOUNTER — Ambulatory Visit: Payer: Managed Care, Other (non HMO) | Admitting: Dermatology

## 2021-11-20 ENCOUNTER — Encounter: Payer: Self-pay | Admitting: Dermatology

## 2021-11-20 DIAGNOSIS — R234 Changes in skin texture: Secondary | ICD-10-CM

## 2021-11-20 DIAGNOSIS — L304 Erythema intertrigo: Secondary | ICD-10-CM | POA: Diagnosis not present

## 2021-11-20 DIAGNOSIS — L700 Acne vulgaris: Secondary | ICD-10-CM

## 2021-11-20 MED ORDER — DOXYCYCLINE HYCLATE 100 MG PO TABS: ORAL_TABLET | ORAL | 0 refills | Status: DC

## 2021-11-20 MED ORDER — METHYLPREDNISOLONE 4 MG PO TBPK: ORAL_TABLET | ORAL | 0 refills | Status: DC

## 2021-11-20 MED ORDER — KETOCONAZOLE 2 % EX CREA: TOPICAL_CREAM | CUTANEOUS | 0 refills | Status: AC

## 2021-11-20 MED ORDER — MELOXICAM 15 MG PO TABS: 15.0000 mg | ORAL_TABLET | Freq: Every day | ORAL | 0 refills | Status: DC

## 2021-11-20 NOTE — Patient Instructions (Signed)
Due to recent changes in healthcare laws, you may see results of your pathology and/or laboratory studies on MyChart before the doctors have had a chance to review them. We understand that in some cases there may be results that are confusing or concerning to you. Please understand that not all results are received at the same time and often the doctors may need to interpret multiple results in order to provide you with the best plan of care or course of treatment. Therefore, we ask that you please give us 2 business days to thoroughly review all your results before contacting the office for clarification. Should we see a critical lab result, you will be contacted sooner.   If You Need Anything After Your Visit  If you have any questions or concerns for your doctor, please call our main line at [phone] and press option 4 to reach your doctor's medical assistant. If no one answers, please leave a voicemail as directed and we will return your call as soon as possible. Messages left after 4 pm will be answered the following business day.   You may also send us a message via MyChart. We typically respond to MyChart messages within 1-2 business days.  For prescription refills, please ask your pharmacy to contact our office. Our fax number is [phone].  If you have an urgent issue when the clinic is closed that cannot wait until the next business day, you can page your doctor at the number below.    Please note that while we do our best to be available for urgent issues outside of office hours, we are not available 24/7.   If you have an urgent issue and are unable to reach us, you may choose to seek medical care at your doctor's office, retail clinic, urgent care center, or emergency room.  If you have a medical emergency, please immediately call 911 or go to the emergency department.  Pager Numbers  - Dr. Kowalski: [phone]  - Dr. Moye: [phone]  - Dr. Stewart:  [phone]  In the event of inclement weather, please call our main line at [phone] for an update on the status of any delays or closures.  Dermatology Medication Tips: Please keep the boxes that topical medications come in in order to help keep track of the instructions about where and how to use these. Pharmacies typically print the medication instructions only on the boxes and not directly on the medication tubes.   If your medication is too expensive, please contact our office at [phone] option 4 or send us a message through MyChart.   We are unable to tell what your co-pay for medications will be in advance as this is different depending on your insurance coverage. However, we may be able to find a substitute medication at lower cost or fill out paperwork to get insurance to cover a needed medication.   If a prior authorization is required to get your medication covered by your insurance company, please allow us 1-2 business days to complete this process.  Drug prices often vary depending on where the prescription is filled and some pharmacies may offer cheaper prices.  The website www.goodrx.com contains coupons for medications through different pharmacies. The prices here do not account for what the cost may be with help from insurance (it may be cheaper with your insurance), but the website can give you the price if you did not use any insurance.  - You can print the associated coupon and take it with   your prescription to the pharmacy.  - You may also stop by our office during regular business hours and pick up a GoodRx coupon card.  - If you need your prescription sent electronically to a different pharmacy, notify our office through [place] MyChart or by phone at [phone] option 4.     Si Usted Necesita Algo Despus de Su Visita  Tambin puede enviarnos un mensaje a travs de MyChart. Por lo general respondemos a los mensajes de MyChart en el transcurso de 1 a 2  das hbiles.  Para renovar recetas, por favor pida a su farmacia que se ponga en contacto con nuestra oficina. Nuestro nmero de fax es el [phone].  Si tiene un asunto urgente cuando la clnica est cerrada y que no puede esperar hasta el siguiente da hbil, puede llamar/localizar a su doctor(a) al nmero que aparece a continuacin.   Por favor, tenga en cuenta que aunque hacemos todo lo posible para estar disponibles para asuntos urgentes fuera del horario de oficina, no estamos disponibles las 24 horas del da, los 7 das de la semana.   Si tiene un problema urgente y no puede comunicarse con nosotros, puede optar por buscar atencin mdica  en el consultorio de su doctor(a), en una clnica privada, en un centro de atencin urgente o en una sala de emergencias.  Si tiene una emergencia mdica, por favor llame inmediatamente al 911 o vaya a la sala de emergencias.  Nmeros de bper  - Dr. Kowalski: [phone]  - Dra. Moye: [phone]  - Dra. Stewart: [phone]  En caso de inclemencias del tiempo, por favor llame a nuestra lnea principal al [phone] para una actualizacin sobre el estado de cualquier retraso o cierre.  Consejos para la medicacin en dermatologa: Por favor, guarde las cajas en las que vienen los medicamentos de uso tpico para ayudarle a seguir las instrucciones sobre dnde y cmo usarlos. Las farmacias generalmente imprimen las instrucciones del medicamento slo en las cajas y no directamente en los tubos del medicamento.   Si su medicamento es muy caro, por favor, pngase en contacto con nuestra oficina llamando al [phone] y presione la opcin 4 o envenos un mensaje a travs de MyChart.   No podemos decirle cul ser su copago por los medicamentos por adelantado ya que esto es diferente dependiendo de la cobertura de su seguro. Sin embargo, es posible que podamos encontrar un medicamento sustituto a menor costo o llenar un formulario para que el  seguro cubra el medicamento que se considera necesario.   Si se requiere una autorizacin previa para que su compaa de seguros cubra su medicamento, por favor permtanos de 1 a 2 das hbiles para completar este proceso.  Los precios de los medicamentos varan con frecuencia dependiendo del lugar de dnde se surte la receta y alguna farmacias pueden ofrecer precios ms baratos.  El sitio web www.goodrx.com tiene cupones para medicamentos de diferentes farmacias. Los precios aqu no tienen en cuenta lo que podra costar con la ayuda del seguro (puede ser ms barato con su seguro), pero el sitio web puede darle el precio si no utiliz ningn seguro.  - Puede imprimir el cupn correspondiente y llevarlo con su receta a la farmacia.  - Tambin puede pasar por nuestra oficina durante el horario de atencin regular y recoger una tarjeta de cupones de GoodRx.  - Si necesita que su receta se enve electrnicamente a una farmacia diferente, informe a nuestra oficina a travs de MyChart de [place]   o por telfono llamando al [phone] y presione la opcin 4.  

## 2021-11-20 NOTE — Progress Notes (Signed)
   Follow-Up Visit   Subjective  Kevin Ramos is a 26 y.o. male who presents for the following: Bumps (Post scalp/neck - bumps, scabs and crusts. ) and Blister (On the R thigh x 5-6 days - recurrent happens often. ). Patient c/o debris under his nails when he scratches his chest and back and he would like to discuss treatment options.   The following portions of the chart were reviewed this encounter and updated as appropriate:   Tobacco  Allergies  Meds  Problems  Med Hx  Surg Hx  Fam Hx     Review of Systems:  No other skin or systemic complaints except as noted in HPI or Assessment and Plan.  Objective  Well appearing patient in no apparent distress; mood and affect are within normal limits.  A focused examination was performed including the face, trunk, extremities. Relevant physical exam findings are noted in the Assessment and Plan.  Neck, chest, face Three papules and pustules on the post neck. Face clear. One crusted papule on the chest. R groin oval papule with peripheral scale.   Groin R groin oval papule with peripheral scale.    Assessment & Plan  Acne vulgaris Neck, chest, face (S/P Isotretinoin x 3 courses) Vs rosacea vs mild skin infection - possible intertrigo at the R groin lesion   Start Doxycycline '100mg'$  po BID x 1 week. Then decrease to QD x 3 weeks. #35 1RF. Doxycycline should be taken with food to prevent nausea. Do not lay down for 30 minutes after taking. Be cautious with sun exposure and use good sun protection while on this medication. Pregnant women should not take this medication.   Recommend salicylic acid shampoo to use daily as a body wash to help slough off oil and dead skin cells of the chest and back. Samples given of [facility] Sal shampoo. Patient has oily skin/sebaceous skin which was the cause of his significant acne.  He still has some oiliness causing his symptoms.  doxycycline (VIBRA-TABS) 100 MG tablet - Neck, chest, face Take one  tab po BID x 1 week. Then take one tab po QD x 3 weeks. Take with food.  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  It is exacerbated by increased movement / activity; sweating; and higher atmospheric temperature.  Start Ketoconazole 2% cream to aa's QD.   ketoconazole (NIZORAL) 2 % cream - Groin Apply to groin QD.  Return in about 3 months (around 02/20/2022).  [name], CMA, am acting as scribe for Sarina Ser, MD . Documentation: I have reviewed the above documentation for accuracy and completeness, and I agree with the above.  Sarina Ser, MD

## 2021-11-20 NOTE — Telephone Encounter (Signed)
Patient is aware but is needing all medications sent to the [facility] on [address] in [place], [place]. Patient states his insurance want cover [place] anymore

## 2021-11-21 NOTE — Progress Notes (Signed)
Subjective:  Patient ID: Kevin Ramos, male    DOB: [birth year],  MRN: [id number]  Chief Complaint  Patient presents with   Injections    26 y.o. male presents with the above complaint.  Patient presents with right heel pain/Planter fasciitis.  Patient states the pain started coming back.  He would like an injection.  He is having some pain in his heel.  He denies any other acute issues.  He has been try to wear his orthotics and shoes.   Review of Systems: Negative except as noted in the HPI. Denies N/V/F/Ch.  Past Medical History:  Diagnosis Date   Acne    Depression    Dysplastic nevus 02/09/2019   right upper back, moderate atypia, close to margin.    Current Outpatient Medications:    acetaminophen (TYLENOL) 500 MG tablet, Take 500 mg by mouth every 6 (six) hours as needed., Disp: , Rfl:    amphetamine-dextroamphetamine (ADDERALL) 5 MG tablet, Take 1 tablet (5 mg total) by mouth daily., Disp: 30 tablet, Rfl: 0   asenapine (SAPHRIS) 5 MG SUBL 24 hr tablet, Place under the tongue., Disp: , Rfl:    azelastine (ASTELIN) 0.1 % nasal spray, Place into the nose., Disp: , Rfl:    calcitRIOL (ROCALTROL) 0.5 MCG capsule, TAKE 1 CAPSULE BY MOUTH DAILY, Disp: 90 capsule, Rfl: 1   calcium carbonate (OSCAL) 1500 (600 Ca) MG TABS tablet, Take by mouth., Disp: , Rfl:    Calcium Carbonate-Vit D-Min (CALCIUM 1200 PO), Take 2 capsules by mouth daily. , Disp: , Rfl:    cetirizine (ZYRTEC) 10 MG tablet, Take by mouth., Disp: , Rfl:    Cholecalciferol (VITAMIN D) 50 MCG (2000 UT) tablet, Take 2,000 Units by mouth daily., Disp: , Rfl:    cyanocobalamin 1000 MCG tablet, Take by mouth., Disp: , Rfl:    DESCOVY 200-25 MG tablet, Take 1 tablet by mouth daily., Disp: , Rfl:    doxycycline (VIBRA-TABS) 100 MG tablet, Take one tab po BID x 1 week. Then take one tab po QD x 3 weeks. Take with food., Disp: 35 tablet, Rfl: 0   DULoxetine (CYMBALTA) 30 MG capsule, Take 2 capsules in the morning and 1  capsule in the evening, Disp: 90 capsule, Rfl: 1   fluticasone (FLONASE) 50 MCG/ACT nasal spray, Place into the nose., Disp: , Rfl:    hydrocortisone 2.5 % ointment, Apply to corner of mouth twice daily for up to 1 week., Disp: 30 g, Rfl: 0   ketoconazole (NIZORAL) 2 % cream, Apply to groin QD., Disp: 60 g, Rfl: 0   KLOR-CON M20 20 MEQ tablet, TAKE 1 TABLET BY MOUTH EVERY DAY, Disp: 90 tablet, Rfl: 1   levothyroxine (SYNTHROID) 75 MCG tablet, TAKE ONE TABLET BY MOUTH EVERY MORNING BEFORE BREAKFAST, Disp: 90 tablet, Rfl: 0   lidocaine (LIDODERM) 5 %, 1 patch daily., Disp: , Rfl:    meloxicam (MOBIC) 15 MG tablet, Take 1 tablet (15 mg total) by mouth daily., Disp: 30 tablet, Rfl: 0   methylPREDNISolone (MEDROL DOSEPAK) 4 MG TBPK tablet, Take as directed, Disp: 21 each, Rfl: 0   mupirocin ointment (BACTROBAN) 2 %, Apply to skin qd-bid, Disp: 22 g, Rfl: 0   nabumetone (RELAFEN) 500 MG tablet, Take 500 mg by mouth 2 (two) times daily. (Patient not taking: Reported on 11/20/2021), Disp: , Rfl:    nystatin (MYCOSTATIN) 100000 UNIT/ML suspension, Take by mouth., Disp: , Rfl:    pregabalin (LYRICA) 75 MG capsule, Start with  $'75mg'N$  capsule at bedtime and if doing well, may increase to twice a day after 5 days, Disp: , Rfl:    Testosterone Undecanoate (JATENZO) 198 MG CAPS, 1 tablet with breakfast and 1 at dinner, Disp: 60 capsule, Rfl: 5   triamcinolone ointment (KENALOG) 0.1 %, Apply 1 application topically 2 (two) times daily as needed (Rash). Use for up to 2 weeks on affected areas of arms  Avoid applying to face, groin, and axilla. Use as directed. (Patient not taking: Reported on 11/20/2021), Disp: 60 g, Rfl: 1  Social History   Tobacco Use  Smoking Status Never  Smokeless Tobacco Never    Allergies  Allergen Reactions   Abilify [Aripiprazole] Nausea Only   Objective:  There were no vitals filed for this visit. There is no height or weight on file to calculate BMI. Constitutional Well  developed. Well nourished.  Vascular Dorsalis pedis pulses palpable bilaterally. Posterior tibial pulses palpable bilaterally. Capillary refill normal to all digits.  No cyanosis or clubbing noted. Pedal hair growth normal.  Neurologic Normal speech. Oriented to person, place, and time. Epicritic sensation to light touch grossly present bilaterally.  Dermatologic Nails well groomed and normal in appearance. No open wounds. No skin lesions.  Orthopedic: Normal joint ROM without pain or crepitus bilaterally. No visible deformities. Tender to palpation at the calcaneal tuber right No pain with calcaneal squeeze right Ankle ROM diminished range of motion right Silfverskiold Test: positive right   Radiographs: Taken and reviewed. No acute fractures or dislocations. No evidence of stress fracture.  Plantar heel spur present. Posterior heel spur present.   Assessment:   No diagnosis found.  Plan:  Patient was evaluated and treated and all questions answered.  Plantar Fasciitis, right with underlying gastrocnemius equinus - XR reviewed as above.  - Educated on icing and stretching. Instructions given.  - Injection delivered to the plantar fascia as below. - DME: Plantar Fascial Brace - Pharmacologic management: None  Procedure: Injection Tendon/Ligament Location: Right plantar fascia at the glabrous junction; medial approach. Skin Prep: alcohol Injectate: 0.5 cc 0.5% marcaine plain, 0.5 cc of 1% Lidocaine, 0.5 cc kenalog 10. Disposition: Patient tolerated procedure well. Injection site dressed with a band-aid.  No follow-ups on file.

## 2021-11-23 ENCOUNTER — Telehealth: Payer: Self-pay | Admitting: *Deleted

## 2021-11-23 NOTE — Telephone Encounter (Signed)
Patient is calling because he cannot take the mobic along with the nabumetone, is there something else physician can prescribe? Please advise.

## 2021-11-29 MED ORDER — ACETAMINOPHEN-CODEINE 300-30 MG PO TABS: 1.0000 | ORAL_TABLET | ORAL | 0 refills | Status: DC | PRN

## 2021-12-18 ENCOUNTER — Other Ambulatory Visit: Payer: Self-pay | Admitting: Podiatry

## 2021-12-19 ENCOUNTER — Other Ambulatory Visit: Payer: Self-pay | Admitting: Student in an Organized Health Care Education/Training Program

## 2021-12-19 DIAGNOSIS — E291 Testicular hypofunction: Secondary | ICD-10-CM

## 2021-12-19 NOTE — Telephone Encounter (Signed)
Please advise,duplicate therapy

## 2021-12-23 ENCOUNTER — Ambulatory Visit
Admission: EM | Admit: 2021-12-23 | Discharge: 2021-12-23 | Disposition: A | Discharge status: Home / Self Care | Payer: Managed Care, Other (non HMO)

## 2021-12-23 DIAGNOSIS — L720 Epidermal cyst: Secondary | ICD-10-CM

## 2021-12-23 MED ORDER — SULFAMETHOXAZOLE-TRIMETHOPRIM 800-160 MG PO TABS: 1.0000 | ORAL_TABLET | Freq: Two times a day (BID) | ORAL | 0 refills | Status: AC

## 2021-12-23 NOTE — ED Triage Notes (Signed)
Pt. Is stating he noticed a bump under his right underarm 4-5 days ago. he is concerned that it is a cyst and would like evaluation.

## 2021-12-23 NOTE — Discharge Instructions (Addendum)
Watch for signs of infection such as worsening pain, redness, swelling, discharge.  Remove the gauze packing after 24 hours.  Apply antibiotic ointment and cover with a bandage x3 days.  Avoid getting the area wet for 3 days.  Afterward you may wash gently with soap and water.  Do not submerge the wound in a bathtub or pool for 2 weeks.

## 2021-12-23 NOTE — ED Provider Notes (Signed)
[facility]    CSN: [id number] Arrival date & time: 12/23/21  0865      History   Chief Complaint Chief Complaint  Patient presents with   Cyst    HPI Kevin Ramos is a 26 y.o. male.   HPI  Presents to urgent care with complaint of painful bump under his right axilla.  First noticed 4 to 5 days ago.  Was told by a family member that it is possibly a cyst and needing evaluation.  Denies any history of previous episode of the same.  Past Medical History:  Diagnosis Date   Acne    Depression    Dysplastic nevus 02/09/2019   right upper back, moderate atypia, close to margin.    Patient Active Problem List   Diagnosis Date Noted   Class 1 obesity due to excess calories with serious comorbidity and body mass index (BMI) of 31.0 to 31.9 in adult 02/20/2021   Hypertriglyceridemia 02/20/2021   Neuropathy of both feet 02/20/2021   MDD (major depressive disorder), recurrent, in partial remission ([place]) 07/25/2020   Fibromyalgia 07/25/2020   Attention deficit hyperactivity disorder (ADHD), predominantly inattentive type 06/22/2020   Status post hip replacement, right 12/11/2019   Hypokalemia 11/23/2019   Hypertrophy of nasal turbinates 10/12/2019   Deviated nasal septum 10/12/2019   Osteoarthritis of right hip 10/10/2019   Legg-Calve-Perthes disease, right 09/22/2019   Non-seasonal allergic rhinitis 09/21/2019   Sacroiliitis ([place]) 07/23/2019   Acute hip pain, bilateral 07/23/2019   MDD (major depressive disorder), recurrent, in full remission ([place]) 07/22/2019   Dyspnea on exertion 07/02/2019   Low testosterone in male 06/30/2019   Vitamin D deficiency 06/16/2019   Acquired hypothyroidism 06/16/2019   Anxiety 05/08/2018   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 No pertinent surgical history.     Home Medications    Prior to Admission medications   Medication Sig Start Date End Date Taking? Authorizing Provider  nabumetone (RELAFEN) 500 MG tablet Take 1 tablet by mouth 2 (two) times daily. 11/22/21  Yes [provider]  acetaminophen (TYLENOL) 500 MG tablet Take 500 mg by mouth every 6 (six) hours as needed.    [provider]  acetaminophen-codeine (TYLENOL #3) 300-30 MG tablet Take 1-2 tablets by mouth every 4 (four) hours as needed for moderate pain. 11/29/21   Felipa Furnace, DPM  amphetamine-dextroamphetamine (ADDERALL) 5 MG tablet Take 1 tablet (5 mg total) by mouth daily. 10/06/20   [place] Crane, MD  asenapine (SAPHRIS) 5 MG SUBL 24 hr tablet Place under the tongue. 02/21/21   [provider]  azelastine (ASTELIN) 0.1 % nasal spray Place into the nose. 12/16/16   [provider]  calcitRIOL (ROCALTROL) 0.5 MCG capsule TAKE 1 CAPSULE BY MOUTH DAILY 02/14/21   Elayne Snare, MD  calcium carbonate (OSCAL) 1500 (600 Ca) MG TABS tablet Take by mouth. 01/14/18   [provider]  Calcium Carbonate-Vit D-Min (CALCIUM 1200 PO) Take 2 capsules by mouth daily.     [provider]  cetirizine (ZYRTEC) 10 MG tablet Take by mouth.    [provider]  Cholecalciferol (VITAMIN D) 50 MCG (2000 UT) tablet Take 2,000 Units by mouth  daily.    [provider]  cyanocobalamin 1000 MCG tablet Take by mouth. 02/20/21   [provider]  DESCOVY 200-25 MG tablet Take 1 tablet by mouth daily. 09/09/18   [provider]  dexamethasone (DECADRON) 1 MG tablet Take by mouth. 12/18/21   [provider]  doxycycline (VIBRA-TABS) 100 MG tablet Take one tab po BID x 1 week. Then take one tab po QD x 3 weeks. Take with food. 11/20/21    Ralene Bathe, MD  DULoxetine (CYMBALTA) 30 MG capsule Take 2 capsules in the morning and 1 capsule in the evening 09/07/20   [place] Crane, MD  ferrous sulfate 325 (65 FE) MG EC tablet Take 1 tablet by mouth every other day. 10/12/21   [provider]  fluticasone (FLONASE) 50 MCG/ACT nasal spray Place into the nose.    [provider]  hydrocortisone 2.5 % ointment Apply to corner of mouth twice daily for up to 1 week. 04/19/20   Moye, [place], MD  ketoconazole (NIZORAL) 2 % cream Apply to groin QD. 11/20/21   Ralene Bathe, MD  KLOR-CON M20 20 MEQ tablet TAKE 1 TABLET BY MOUTH EVERY DAY 06/19/21   Elayne Snare, MD  levothyroxine (SYNTHROID) 75 MCG tablet TAKE ONE TABLET BY MOUTH EVERY MORNING BEFORE BREAKFAST 02/06/21   Elayne Snare, MD  lidocaine (LIDODERM) 5 % 1 patch daily. 04/09/21   [provider]  meloxicam (MOBIC) 15 MG tablet TAKE 1 TABLET BY MOUTH EVERY DAY 12/20/21   Felipa Furnace, DPM  methylPREDNISolone (MEDROL DOSEPAK) 4 MG TBPK tablet Take as directed 11/20/21   Felipa Furnace, DPM  mupirocin ointment (BACTROBAN) 2 % Apply to skin qd-bid 10/12/20   Ralene Bathe, MD  nabumetone (RELAFEN) 500 MG tablet Take 500 mg by mouth 2 (two) times daily. Patient not taking: Reported on 11/20/2021 02/07/21   [provider]  nystatin (MYCOSTATIN) 100000 UNIT/ML suspension Take by mouth. 02/23/20   [provider]  pregabalin (LYRICA) 75 MG capsule Start with '75mg'$  capsule at bedtime and if doing well, may increase to twice a day after 5 days 11/03/20   [provider]  Testosterone Undecanoate (JATENZO) 198 MG CAPS 1 tablet with breakfast and 1 at dinner 09/29/21   Elayne Snare, MD  triamcinolone ointment (KENALOG) 0.1 % Apply 1 application topically 2 (two) times daily as needed (Rash). Use for up to 2 weeks on affected areas of arms  Avoid applying to face, groin, and axilla. Use as directed. Patient not taking: Reported on 11/20/2021 04/14/20    Laurence Ferrari, [place], MD  Vitamin D, Ergocalciferol, (DRISDOL) 1.25 MG (50000 UNIT) CAPS capsule Take 50,000 Units by mouth once a week. 10/12/21   [provider]    Family History Family History  Problem Relation Age of Onset   Hypoparathyroidism Mother     Social History Social History   Tobacco Use   Smoking status: Never   Smokeless tobacco: Never  Substance Use Topics   Alcohol use: Never   Drug use: No     Allergies   Abilify [aripiprazole]   Review of Systems Review of Systems   Physical Exam Triage Vital Signs ED Triage Vitals  Enc Vitals Group     BP 12/23/21 0903 116/80     Pulse --      Resp 12/23/21 0903 16     Temp 12/23/21 0903 97.9 F (36.6 C)     Temp src --  SpO2 12/23/21 0903 96 %     Weight --      Height --      Head Circumference --      Peak Flow --      Pain Score 12/23/21 0904 5     Pain Loc --      Pain Edu? --      Excl. in [place]? --    No data found.  Updated Vital Signs BP 116/80   Temp 97.9 F (36.6 C)   Resp 16   SpO2 96%   Visual Acuity Right Eye Distance:   Left Eye Distance:   Bilateral Distance:    Right Eye Near:   Left Eye Near:    Bilateral Near:     Physical Exam Vitals reviewed.  Constitutional:      Appearance: Normal appearance.  Skin:    General: Skin is warm and dry.     Findings: Lesion present.       Neurological:     General: No focal deficit present.     Mental Status: He is alert and oriented to person, place, and time.  Psychiatric:        Mood and Affect: Mood normal.        Behavior: Behavior normal.      UC Treatments / Results  Labs (all labs ordered are listed, but only abnormal results are displayed) Labs Reviewed - No data to display  EKG   Radiology No results found.  Procedures Incision and Drainage  Date/Time: 12/23/2021 10:27 AM  Performed by: Rose Phi, FNP Authorized by: Rose Phi, FNP   Consent:    Consent obtained:  Verbal    Consent given by:  Patient   Risks, benefits, and alternatives were discussed: yes     Risks discussed:  Bleeding, incomplete drainage, pain and infection   Alternatives discussed:  No treatment Universal protocol:    Procedure explained and questions answered to patient or proxy's satisfaction: yes     Relevant documents present and verified: yes     Test results available : no     Imaging studies available: no     Required blood products, implants, devices, and special equipment available: no     Site/side marked: no     Immediately prior to procedure, a time out was called: yes     Patient identity confirmed:  Verbally with patient Location:    Type:  Abscess   Size:  1 x 1.5 cm   Location: R axilla. Pre-procedure details:    Skin preparation:  Povidone-iodine Sedation:    Sedation type:  None Anesthesia:    Anesthesia method:  Local infiltration   Local anesthetic:  Lidocaine 1% WITH epi Procedure type:    Complexity:  Simple Procedure details:    Ultrasound guidance: no     Needle aspiration: no     Incision types:  Stab incision   Incision depth:  Dermal   Wound management:  Probed and deloculated   Drainage:  Purulent and bloody   Drainage amount:  Moderate   Wound treatment:  Wound left open   Packing materials:  1/4 in iodoform gauze   Amount 1/4" iodoform:  2 cm Post-procedure details:    Procedure completion:  Tolerated Comments:     Gave instructions to patient to pull iodoform after 24 hours.  Apply antibiotic ointment and cover with sterile bandage.  Avoid getting the area wet for 3 days.  Afterward may wash gently with  soap and water.  (including critical care time)  Medications Ordered in UC Medications - No data to display  Initial Impression / Assessment and Plan / UC Course  I have reviewed the triage vital signs and the nursing notes.  Pertinent labs & imaging results that were available during my care of the patient were reviewed by me and  considered in my medical decision making (see chart for details).   Likely infection of epidermoid cyst of right axilla.  There is an area of fluctuance that evacuate via I&D today.  Providing bactrim PO. Recommending f/u with dermatology after resolution for removing of cyst.   Final Clinical Impressions(s) / UC Diagnoses   Final diagnoses:  None   Discharge Instructions   None    ED Prescriptions   None    PDMP not reviewed this encounter.   Rose Phi, [place] 12/23/21 1031

## 2021-12-24 ENCOUNTER — Ambulatory Visit
Admission: RE | Admit: 2021-12-24 | Discharge: 2021-12-24 | Disposition: A | Discharge status: Home / Self Care | Payer: Managed Care, Other (non HMO) | Source: Ambulatory Visit | Attending: Student in an Organized Health Care Education/Training Program | Admitting: Student in an Organized Health Care Education/Training Program

## 2021-12-24 DIAGNOSIS — E291 Testicular hypofunction: Secondary | ICD-10-CM

## 2021-12-24 MED ORDER — GADOPICLENOL 0.5 MMOL/ML IV SOLN
5.0000 mL | Freq: Once | INTRAVENOUS | Status: AC | PRN
  Administered 2021-12-24: 5 mL via INTRAVENOUS

## 2021-12-28 ENCOUNTER — Ambulatory Visit: Payer: Managed Care, Other (non HMO) | Admitting: Endocrinology

## 2022-01-03 ENCOUNTER — Telehealth: Payer: Self-pay

## 2022-01-03 NOTE — Telephone Encounter (Signed)
Patient was seen in urgent care on 09/30 for a inflamed cyst. I&D was performed in office but recommended follow up with derm for complete cyst removal.  Progress notes are in epic.  Would you like to see patent for consult or schedule patient for surgery?

## 2022-01-03 NOTE — Telephone Encounter (Signed)
Left msg for patient to return my call. aw

## 2022-01-04 NOTE — Telephone Encounter (Signed)
Patient advised of information per Dr. Kowalski.  

## 2022-01-09 ENCOUNTER — Other Ambulatory Visit: Payer: Managed Care, Other (non HMO)

## 2022-01-16 ENCOUNTER — Ambulatory Visit: Payer: Managed Care, Other (non HMO) | Admitting: Podiatry

## 2022-01-16 ENCOUNTER — Encounter: Payer: Self-pay | Admitting: Podiatry

## 2022-01-16 DIAGNOSIS — M722 Plantar fascial fibromatosis: Secondary | ICD-10-CM

## 2022-01-16 NOTE — Progress Notes (Signed)
Subjective:  Patient ID: Kevin Ramos, male    DOB: 1995/11/18,  MRN: [id number]  Chief Complaint  Patient presents with   Plantar Fasciitis    Right foot plantar fasciitis pt stated that over the last few days his pain has gotten worse     26 y.o. male presents with the above complaint.  Patient presents with right heel pain/Planter fasciitis.  States that he started feeling better but then he came back and is hurting in the entire bottom of the foot.  He denies any other acute complaints.  He would like to discuss next treatment plan  Review of Systems: Negative except as noted in the HPI. Denies N/V/F/Ch.  Past Medical History:  Diagnosis Date   Acne    Depression    Dysplastic nevus 02/09/2019   right upper back, moderate atypia, close to margin.    Current Outpatient Medications:    pregabalin (LYRICA) 75 MG capsule, Take 1 capsule by mouth 2 (two) times daily., Disp: , Rfl:    acetaminophen (TYLENOL) 500 MG tablet, Take 500 mg by mouth every 6 (six) hours as needed., Disp: , Rfl:    acetaminophen-codeine (TYLENOL #3) 300-30 MG tablet, Take 1-2 tablets by mouth every 4 (four) hours as needed for moderate pain., Disp: 30 tablet, Rfl: 0   amphetamine-dextroamphetamine (ADDERALL) 5 MG tablet, Take 1 tablet (5 mg total) by mouth daily., Disp: 30 tablet, Rfl: 0   asenapine (SAPHRIS) 5 MG SUBL 24 hr tablet, Place under the tongue., Disp: , Rfl:    azelastine (ASTELIN) 0.1 % nasal spray, Place into the nose., Disp: , Rfl:    calcitRIOL (ROCALTROL) 0.5 MCG capsule, TAKE 1 CAPSULE BY MOUTH DAILY, Disp: 90 capsule, Rfl: 1   calcium carbonate (OSCAL) 1500 (600 Ca) MG TABS tablet, Take by mouth., Disp: , Rfl:    Calcium Carbonate-Vit D-Min (CALCIUM 1200 PO), Take 2 capsules by mouth daily. , Disp: , Rfl:    cetirizine (ZYRTEC) 10 MG tablet, Take by mouth., Disp: , Rfl:    Cholecalciferol (VITAMIN D) 50 MCG (2000 UT) tablet, Take 2,000 Units by mouth daily., Disp: , Rfl:     cyanocobalamin 1000 MCG tablet, Take by mouth., Disp: , Rfl:    DESCOVY 200-25 MG tablet, Take 1 tablet by mouth daily., Disp: , Rfl:    dexamethasone (DECADRON) 1 MG tablet, Take by mouth., Disp: , Rfl:    DULoxetine (CYMBALTA) 30 MG capsule, Take 2 capsules in the morning and 1 capsule in the evening, Disp: 90 capsule, Rfl: 1   emtricitabine-tenofovir (TRUVADA) 200-300 MG tablet, Take 1 tablet by mouth daily., Disp: , Rfl:    ferrous sulfate 325 (65 FE) MG EC tablet, Take 1 tablet by mouth every other day., Disp: , Rfl:    fluticasone (FLONASE) 50 MCG/ACT nasal spray, Place into the nose., Disp: , Rfl:    hydrocortisone 2.5 % ointment, Apply to corner of mouth twice daily for up to 1 week., Disp: 30 g, Rfl: 0   ketoconazole (NIZORAL) 2 % cream, Apply to groin QD., Disp: 60 g, Rfl: 0   KLOR-CON M20 20 MEQ tablet, TAKE 1 TABLET BY MOUTH EVERY DAY, Disp: 90 tablet, Rfl: 1   levothyroxine (SYNTHROID) 75 MCG tablet, TAKE ONE TABLET BY MOUTH EVERY MORNING BEFORE BREAKFAST, Disp: 90 tablet, Rfl: 0   lidocaine (LIDODERM) 5 %, 1 patch daily., Disp: , Rfl:    meloxicam (MOBIC) 15 MG tablet, TAKE 1 TABLET BY MOUTH EVERY DAY, Disp: 30 tablet,  Rfl: 0   methylPREDNISolone (MEDROL DOSEPAK) 4 MG TBPK tablet, Take as directed, Disp: 21 each, Rfl: 0   mupirocin ointment (BACTROBAN) 2 %, Apply to skin qd-bid, Disp: 22 g, Rfl: 0   nabumetone (RELAFEN) 500 MG tablet, Take 500 mg by mouth 2 (two) times daily. (Patient not taking: Reported on 11/20/2021), Disp: , Rfl:    nabumetone (RELAFEN) 500 MG tablet, Take 1 tablet by mouth 2 (two) times daily., Disp: , Rfl:    nystatin (MYCOSTATIN) 100000 UNIT/ML suspension, Take by mouth., Disp: , Rfl:    pregabalin (LYRICA) 75 MG capsule, Start with '75mg'$  capsule at bedtime and if doing well, may increase to twice a day after 5 days, Disp: , Rfl:    Testosterone Undecanoate (JATENZO) 198 MG CAPS, 1 tablet with breakfast and 1 at dinner, Disp: 60 capsule, Rfl: 5   triamcinolone  ointment (KENALOG) 0.1 %, Apply 1 application topically 2 (two) times daily as needed (Rash). Use for up to 2 weeks on affected areas of arms  Avoid applying to face, groin, and axilla. Use as directed. (Patient not taking: Reported on 11/20/2021), Disp: 60 g, Rfl: 1   Vitamin D, Ergocalciferol, (DRISDOL) 1.25 MG (50000 UNIT) CAPS capsule, Take 50,000 Units by mouth once a week., Disp: , Rfl:   Social History   Tobacco Use  Smoking Status Never  Smokeless Tobacco Never    Allergies  Allergen Reactions   Abilify [Aripiprazole] Nausea Only   Objective:  There were no vitals filed for this visit. There is no height or weight on file to calculate BMI. Constitutional Well developed. Well nourished.  Vascular Dorsalis pedis pulses palpable bilaterally. Posterior tibial pulses palpable bilaterally. Capillary refill normal to all digits.  No cyanosis or clubbing noted. Pedal hair growth normal.  Neurologic Normal speech. Oriented to person, place, and time. Epicritic sensation to light touch grossly present bilaterally.  Dermatologic Nails well groomed and normal in appearance. No open wounds. No skin lesions.  Orthopedic: Normal joint ROM without pain or crepitus bilaterally. No visible deformities. Tender to palpation at the calcaneal tuber right No pain with calcaneal squeeze right Ankle ROM diminished range of motion right Silfverskiold Test: positive right   Radiographs: Taken and reviewed. No acute fractures or dislocations. No evidence of stress fracture.  Plantar heel spur present. Posterior heel spur present.   Assessment:   No diagnosis found.  Plan:  Patient was evaluated and treated and all questions answered.  Plantar Fasciitis, right with underlying gastrocnemius equinus~recurrence - XR reviewed as above.  - Educated on icing and stretching. Instructions given.  -I will hold off on injection for now.  Planning on injection next time if cam boot does not help -  DME: Cam boot was dispensed. - Pharmacologic management: None   No follow-ups on file.

## 2022-02-13 ENCOUNTER — Ambulatory Visit: Payer: Managed Care, Other (non HMO) | Admitting: Podiatry

## 2022-02-13 DIAGNOSIS — M722 Plantar fascial fibromatosis: Secondary | ICD-10-CM | POA: Diagnosis not present

## 2022-02-13 NOTE — Progress Notes (Signed)
Subjective:  Patient ID: Kevin Ramos, male    DOB: Aug 13, 1995,  MRN: [id number]  Chief Complaint  Patient presents with   Plantar Fasciitis    26 y.o. male presents with the above complaint.  Patient presents with right heel pain/Planter fasciitis.  He states that the cam boot did not help much.  He denies any other acute complaints is causing him more pain to the heel.  Review of Systems: Negative except as noted in the HPI. Denies N/V/F/Ch.  Past Medical History:  Diagnosis Date   Acne    Depression    Dysplastic nevus 02/09/2019   right upper back, moderate atypia, close to margin.    Current Outpatient Medications:    acetaminophen (TYLENOL) 500 MG tablet, Take 500 mg by mouth every 6 (six) hours as needed., Disp: , Rfl:    acetaminophen-codeine (TYLENOL #3) 300-30 MG tablet, Take 1-2 tablets by mouth every 4 (four) hours as needed for moderate pain., Disp: 30 tablet, Rfl: 0   amphetamine-dextroamphetamine (ADDERALL) 5 MG tablet, Take 1 tablet (5 mg total) by mouth daily., Disp: 30 tablet, Rfl: 0   asenapine (SAPHRIS) 5 MG SUBL 24 hr tablet, Place under the tongue., Disp: , Rfl:    azelastine (ASTELIN) 0.1 % nasal spray, Place into the nose., Disp: , Rfl:    calcitRIOL (ROCALTROL) 0.5 MCG capsule, TAKE 1 CAPSULE BY MOUTH DAILY, Disp: 90 capsule, Rfl: 1   calcium carbonate (OSCAL) 1500 (600 Ca) MG TABS tablet, Take by mouth., Disp: , Rfl:    Calcium Carbonate-Vit D-Min (CALCIUM 1200 PO), Take 2 capsules by mouth daily. , Disp: , Rfl:    cetirizine (ZYRTEC) 10 MG tablet, Take by mouth., Disp: , Rfl:    Cholecalciferol (VITAMIN D) 50 MCG (2000 UT) tablet, Take 2,000 Units by mouth daily., Disp: , Rfl:    cyanocobalamin 1000 MCG tablet, Take by mouth., Disp: , Rfl:    DESCOVY 200-25 MG tablet, Take 1 tablet by mouth daily., Disp: , Rfl:    dexamethasone (DECADRON) 1 MG tablet, Take by mouth., Disp: , Rfl:    DULoxetine (CYMBALTA) 30 MG capsule, Take 2 capsules in the  morning and 1 capsule in the evening, Disp: 90 capsule, Rfl: 1   emtricitabine-tenofovir (TRUVADA) 200-300 MG tablet, Take 1 tablet by mouth daily., Disp: , Rfl:    ferrous sulfate 325 (65 FE) MG EC tablet, Take 1 tablet by mouth every other day., Disp: , Rfl:    fluticasone (FLONASE) 50 MCG/ACT nasal spray, Place into the nose., Disp: , Rfl:    hydrocortisone 2.5 % ointment, Apply to corner of mouth twice daily for up to 1 week., Disp: 30 g, Rfl: 0   ketoconazole (NIZORAL) 2 % cream, Apply to groin QD., Disp: 60 g, Rfl: 0   KLOR-CON M20 20 MEQ tablet, TAKE 1 TABLET BY MOUTH EVERY DAY, Disp: 90 tablet, Rfl: 1   levothyroxine (SYNTHROID) 75 MCG tablet, TAKE ONE TABLET BY MOUTH EVERY MORNING BEFORE BREAKFAST, Disp: 90 tablet, Rfl: 0   lidocaine (LIDODERM) 5 %, 1 patch daily., Disp: , Rfl:    meloxicam (MOBIC) 15 MG tablet, TAKE 1 TABLET BY MOUTH EVERY DAY, Disp: 30 tablet, Rfl: 0   methylPREDNISolone (MEDROL DOSEPAK) 4 MG TBPK tablet, Take as directed, Disp: 21 each, Rfl: 0   mupirocin ointment (BACTROBAN) 2 %, Apply to skin qd-bid, Disp: 22 g, Rfl: 0   nabumetone (RELAFEN) 500 MG tablet, Take 500 mg by mouth 2 (two) times daily. (Patient not  taking: Reported on 11/20/2021), Disp: , Rfl:    nabumetone (RELAFEN) 500 MG tablet, Take 1 tablet by mouth 2 (two) times daily., Disp: , Rfl:    nystatin (MYCOSTATIN) 100000 UNIT/ML suspension, Take by mouth., Disp: , Rfl:    pregabalin (LYRICA) 75 MG capsule, Start with '75mg'$  capsule at bedtime and if doing well, may increase to twice a day after 5 days, Disp: , Rfl:    pregabalin (LYRICA) 75 MG capsule, Take 1 capsule by mouth 2 (two) times daily., Disp: , Rfl:    Testosterone Undecanoate (JATENZO) 198 MG CAPS, 1 tablet with breakfast and 1 at dinner, Disp: 60 capsule, Rfl: 5   triamcinolone ointment (KENALOG) 0.1 %, Apply 1 application topically 2 (two) times daily as needed (Rash). Use for up to 2 weeks on affected areas of arms  Avoid applying to face,  groin, and axilla. Use as directed. (Patient not taking: Reported on 11/20/2021), Disp: 60 g, Rfl: 1   Vitamin D, Ergocalciferol, (DRISDOL) 1.25 MG (50000 UNIT) CAPS capsule, Take 50,000 Units by mouth once a week., Disp: , Rfl:   Social History   Tobacco Use  Smoking Status Never  Smokeless Tobacco Never    Allergies  Allergen Reactions   Abilify [Aripiprazole] Nausea Only   Objective:  There were no vitals filed for this visit. There is no height or weight on file to calculate BMI. Constitutional Well developed. Well nourished.  Vascular Dorsalis pedis pulses palpable bilaterally. Posterior tibial pulses palpable bilaterally. Capillary refill normal to all digits.  No cyanosis or clubbing noted. Pedal hair growth normal.  Neurologic Normal speech. Oriented to person, place, and time. Epicritic sensation to light touch grossly present bilaterally.  Dermatologic Nails well groomed and normal in appearance. No open wounds. No skin lesions.  Orthopedic: Normal joint ROM without pain or crepitus bilaterally. No visible deformities. Tender to palpation at the calcaneal tuber right No pain with calcaneal squeeze right Ankle ROM diminished range of motion right Silfverskiold Test: positive right   Radiographs: Taken and reviewed. No acute fractures or dislocations. No evidence of stress fracture.  Plantar heel spur present. Posterior heel spur present.   Assessment:   No diagnosis found.  Plan:  Patient was evaluated and treated and all questions answered.  Plantar Fasciitis, right with underlying gastrocnemius equinus - XR reviewed as above.  - Educated on icing and stretching. Instructions given.  - Injection delivered to the plantar fascia as below. - DME: Plantar Fascial Brace - Pharmacologic management: None -Cam boot immobilization has not helped.  If there is no improvement we will need to discuss PRP injection versus surgery  Procedure: Injection  Tendon/Ligament Location: Right plantar fascia at the glabrous junction; medial approach. Skin Prep: alcohol Injectate: 0.5 cc 0.5% marcaine plain, 0.5 cc of 1% Lidocaine, 0.5 cc kenalog 10. Disposition: Patient tolerated procedure well. Injection site dressed with a band-aid.  No follow-ups on file.

## 2022-02-14 ENCOUNTER — Other Ambulatory Visit: Payer: Self-pay | Admitting: Endocrinology

## 2022-02-14 DIAGNOSIS — R7989 Other specified abnormal findings of blood chemistry: Secondary | ICD-10-CM

## 2022-02-26 ENCOUNTER — Ambulatory Visit: Payer: Managed Care, Other (non HMO) | Admitting: Dermatology

## 2022-03-02 ENCOUNTER — Other Ambulatory Visit: Payer: Self-pay | Admitting: Endocrinology

## 2022-03-02 ENCOUNTER — Encounter: Payer: Self-pay | Admitting: Endocrinology

## 2022-03-02 DIAGNOSIS — E038 Other specified hypothyroidism: Secondary | ICD-10-CM

## 2022-03-02 DIAGNOSIS — E201 Pseudohypoparathyroidism: Secondary | ICD-10-CM

## 2022-03-02 DIAGNOSIS — E230 Hypopituitarism: Secondary | ICD-10-CM

## 2022-03-05 ENCOUNTER — Other Ambulatory Visit: Payer: Self-pay

## 2022-03-05 ENCOUNTER — Other Ambulatory Visit: Payer: Self-pay | Admitting: Endocrinology

## 2022-03-05 DIAGNOSIS — E038 Other specified hypothyroidism: Secondary | ICD-10-CM

## 2022-03-05 DIAGNOSIS — E230 Hypopituitarism: Secondary | ICD-10-CM

## 2022-03-05 DIAGNOSIS — E201 Pseudohypoparathyroidism: Secondary | ICD-10-CM

## 2022-03-05 NOTE — Addendum Note (Signed)
Addended by: Nils Flack I on: 03/05/2022 10:12 AM   Modules accepted: Orders

## 2022-03-06 ENCOUNTER — Other Ambulatory Visit: Payer: Self-pay | Admitting: Dermatology

## 2022-03-06 DIAGNOSIS — L700 Acne vulgaris: Secondary | ICD-10-CM

## 2022-03-09 ENCOUNTER — Encounter: Payer: Self-pay | Admitting: Endocrinology

## 2022-03-09 ENCOUNTER — Ambulatory Visit (INDEPENDENT_AMBULATORY_CARE_PROVIDER_SITE_OTHER): Payer: Managed Care, Other (non HMO) | Admitting: Endocrinology

## 2022-03-09 VITALS — BP 104/64 | HR 90 | Ht 69.5 in | Wt 201.0 lb

## 2022-03-09 DIAGNOSIS — E876 Hypokalemia: Secondary | ICD-10-CM | POA: Diagnosis not present

## 2022-03-09 DIAGNOSIS — E230 Hypopituitarism: Secondary | ICD-10-CM | POA: Diagnosis not present

## 2022-03-09 DIAGNOSIS — E201 Pseudohypoparathyroidism: Secondary | ICD-10-CM | POA: Diagnosis not present

## 2022-03-09 DIAGNOSIS — E038 Other specified hypothyroidism: Secondary | ICD-10-CM | POA: Diagnosis not present

## 2022-03-09 MED ORDER — POTASSIUM CHLORIDE ER 8 MEQ PO TBCR: 8.0000 meq | EXTENDED_RELEASE_TABLET | Freq: Three times a day (TID) | ORAL | 1 refills | Status: DC

## 2022-03-09 NOTE — Progress Notes (Signed)
Patient ID: Kevin Ramos, male   DOB: Mar 16, 1996, 26 y.o.   MRN: [id number]             Chief complaint: Endocrinology follow-up  History of Present Illness:  Problem 1: HYPOCALCEMIA  RECENT history:  Since 4/21 he has been on 0.5 mcg of calcitriol, previously had required larger doses Also takes 2 tablets of 1200 mg calcium once or twice a day  He has taken his regimen very regularly as prescribed  Usually has complaints of tingling in the hands or face cramping or stiffness associated with low calcium levels Not had any of the symptoms recently No twitching or cramping in the muscles  Calcium appears to be slightly below normal Think this is from being somewhat irregular with his medications recently   Lab Results  Component Value Date   CALCIUM 8.3 (L) 03/06/2022   CALCIUM 9.3 07/25/2021   CALCIUM 8.7 04/12/2021    VITAMIN D deficiency: He is taking vitamin D3, 2000 units daily   Lab Results  Component Value Date   VD25OH 20.83 (L) 03/25/2019   VD25OH 30.7 10/29/2017   VD25OH 41.22 12/27/2016   HYPOTHYROIDISM:  He had thyroid functions checked because of fatigue in 12/20 Free T4 was low normal at 0.63 as of 2/21 With his TSH being mildly increased he was told to start taking levothyroxine 25 mcg daily This was subsequently increased  Previously had symptoms of fatigue and sleepiness Still has complaints of fatigue  He is continuing on 75 mcg levothyroxine since 9/21 Free T4 normal   Lab Results  Component Value Date   TSH 3.85 04/12/2021   TSH 4.06 02/14/2021   TSH 1.92 07/20/2020   FREET4 1.21 03/06/2022   FREET4 1.22 07/25/2021   FREET4 0.66 04/12/2021   Problem 2: HYPOGONADISM:  Because of his significant fatigue, lack of energy and motivation testosterone was checked and free testosterone was low on 2 occasions LH normal along with prolactin  Previously was consistently using his 4 mg Androderm and was able to continue despite some  scaling irritation He was switched to AndroGel because of insurance preference  Because of continued fatigue and low testosterone levels persistently with AndroGel higher doses he was switched to Jatenzo 198 mg twice a day but with this his hemoglobin went up above normal despite testosterone level being only 262  He was to hide on [place] but this was stopped because he says it gives him recurrent sinus infections He still feels fatigued as before Androderm is not available apparently  Testosterone level pending His hemoglobin is normal   Lab Results  Component Value Date   TESTOSTERONE WILL FOLLOW 03/06/2022   TESTOSTERONE 162 (L) 09/25/2021   TESTOSTERONE 262 (L) 07/25/2021   TESTOSTERONE 227.1 (L) 06/09/2021   TESTOSTERONE 271.44 (L) 04/12/2021   Lab Results  Component Value Date   TESTOFREE 6.7 (L) 05/05/2019   TESTOFREE 7.5 (L) 04/30/2019   Lab Results  Component Value Date   HGB 16.8 03/06/2022      Past Medical History:  Diagnosis Date   Acne    Depression    Dysplastic nevus 02/09/2019   right upper back, moderate atypia, close to margin.    No past surgical history on file.  Family History  Problem Relation Age of Onset   Hypoparathyroidism Mother     Social History:  reports that he has never smoked. He has never used smokeless tobacco. He reports that he does not drink alcohol and does not  use drugs.   Allergies:  Allergies  Allergen Reactions   Abilify [Aripiprazole] Nausea Only    Allergies as of 03/09/2022       Reactions   Abilify [aripiprazole] Nausea Only        Medication List        Accurate as of March 09, 2022  9:27 AM. If you have any questions, ask your nurse or doctor.          acetaminophen 500 MG tablet Commonly known as: TYLENOL Take 500 mg by mouth every 6 (six) hours as needed.   acetaminophen-codeine 300-30 MG tablet Commonly known as: TYLENOL #3 Take 1-2 tablets by mouth every 4 (four) hours as needed  for moderate pain.   amphetamine-dextroamphetamine 5 MG tablet Commonly known as: Adderall Take 1 tablet (5 mg total) by mouth daily.   asenapine 5 MG Subl 24 hr tablet Commonly known as: [place] under the tongue.   azelastine 0.1 % nasal spray Commonly known as: ASTELIN Place into the nose.   calcitRIOL 0.5 MCG capsule Commonly known as: ROCALTROL TAKE 1 CAPSULE BY MOUTH DAILY   CALCIUM 1200 PO Take 2 capsules by mouth daily.   calcium carbonate 1500 (600 Ca) MG Tabs tablet Commonly known as: OSCAL Take by mouth.   cetirizine 10 MG tablet Commonly known as: ZYRTEC Take by mouth.   cyanocobalamin 1000 MCG tablet Take by mouth.   Descovy 200-25 MG tablet Generic drug: emtricitabine-tenofovir AF Take 1 tablet by mouth daily.   dexamethasone 1 MG tablet Commonly known as: DECADRON Take by mouth.   doxycycline 100 MG tablet Commonly known as: VIBRA-TABS TAKE 1 TABLET BY MOUTH TWICE A DAY X 1 WEEK. THEN TAKE 1 TAB BY MOUTH DAILY X 3 WEEKS WITH FOOD   DULoxetine 30 MG capsule Commonly known as: Cymbalta Take 2 capsules in the morning and 1 capsule in the evening   emtricitabine-tenofovir 200-300 MG tablet Commonly known as: TRUVADA Take 1 tablet by mouth daily.   ferrous sulfate 325 (65 FE) MG EC tablet Take 1 tablet by mouth every other day.   fluticasone 50 MCG/ACT nasal spray Commonly known as: FLONASE Place into the nose.   hydrocortisone 2.5 % ointment Apply to corner of mouth twice daily for up to 1 week.   Jatenzo 198 MG Caps Generic drug: Testosterone Undecanoate 1 tablet with breakfast and 1 at dinner   ketoconazole 2 % cream Commonly known as: NIZORAL Apply to groin QD.   Klor-Con M20 20 MEQ tablet Generic drug: potassium chloride SA TAKE 1 TABLET BY MOUTH EVERY DAY   levothyroxine 75 MCG tablet Commonly known as: SYNTHROID TAKE ONE TABLET BY MOUTH EVERY MORNING BEFORE BREAKFAST   lidocaine 5 % Commonly known as: LIDODERM 1  patch daily.   meloxicam 15 MG tablet Commonly known as: MOBIC TAKE 1 TABLET BY MOUTH EVERY DAY   methylPREDNISolone 4 MG Tbpk tablet Commonly known as: MEDROL DOSEPAK Take as directed   mupirocin ointment 2 % Commonly known as: BACTROBAN Apply to skin qd-bid   nabumetone 500 MG tablet Commonly known as: RELAFEN Take 500 mg by mouth 2 (two) times daily.   nabumetone 500 MG tablet Commonly known as: RELAFEN Take 1 tablet by mouth 2 (two) times daily.   nystatin 100000 UNIT/ML suspension Commonly known as: MYCOSTATIN Take by mouth.   pregabalin 75 MG capsule Commonly known as: LYRICA Start with '75mg'$  capsule at bedtime and if doing well, may increase to twice a day after  5 days   pregabalin 75 MG capsule Commonly known as: LYRICA Take 1 capsule by mouth 2 (two) times daily.   triamcinolone ointment 0.1 % Commonly known as: KENALOG Apply 1 application topically 2 (two) times daily as needed (Rash). Use for up to 2 weeks on affected areas of arms  Avoid applying to face, groin, and axilla. Use as directed.   Vitamin D (Ergocalciferol) 1.25 MG (50000 UNIT) Caps capsule Commonly known as: DRISDOL Take 50,000 Units by mouth once a week.   Vitamin D 50 MCG (2000 UT) tablet Take 2,000 Units by mouth daily.            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  Unable to get his testosterone level therapeutic without either side effects or  increased hemoglobin Still waiting for testosterone levels from labs today He has significant fatigue  Hypoparathyroidism: Although asymptomatic his calcium is slightly below normal but this is from being irregular with his supplement  HYPOKALEMIA: This is unexplained by any medication use, does have remote history of hypomagnesemia also He will not take the 20 mEq potassium supplement because of the large size  PLAN:   Decide on dose of Jatenzo based on his testosterone level However may need to consider injectable drugs if this is not adequate Will need to watch his hematocrit also at the same time He needs to try and take his calcium and calcitriol regularly as previously prescribed Encouraged him to exercise as much as possible Start K-Tab 8 mEq instead of potassium chloride 20, 3 tablets daily Also given him list of high potassium foods Check magnesium level Recheck potassium in about a month Since blood pressure is low normal may not be a candidate for Aldactone; also consider checking urine potassium Elayne Snare 03/09/2022, 9:27 AM

## 2022-03-10 LAB — CBC
Hematocrit: 50 % (ref 37.5–51.0)
Hemoglobin: 16.8 g/dL (ref 13.0–17.7)
MCH: 27.4 pg (ref 26.6–33.0)
MCHC: 33.6 g/dL (ref 31.5–35.7)
MCV: 82 fL (ref 79–97)
Platelets: 255 10*3/uL (ref 150–450)
RBC: 6.13 x10E6/uL — ABNORMAL HIGH (ref 4.14–5.80)
RDW: 17 % — ABNORMAL HIGH (ref 11.6–15.4)
WBC: 8.1 10*3/uL (ref 3.4–10.8)

## 2022-03-10 LAB — BASIC METABOLIC PANEL
BUN/Creatinine Ratio: 6 — ABNORMAL LOW (ref 9–20)
BUN: 6 mg/dL (ref 6–20)
CO2: 26 mmol/L (ref 20–29)
Calcium: 8.3 mg/dL — ABNORMAL LOW (ref 8.7–10.2)
Chloride: 98 mmol/L (ref 96–106)
Creatinine, Ser: 0.97 mg/dL (ref 0.76–1.27)
Glucose: 112 mg/dL — ABNORMAL HIGH (ref 70–99)
Potassium: 3 mmol/L — ABNORMAL LOW (ref 3.5–5.2)
Sodium: 139 mmol/L (ref 134–144)
eGFR: 109 mL/min/{1.73_m2} (ref >59)

## 2022-03-10 LAB — T4, FREE: Free T4: 1.21 ng/dL (ref 0.82–1.77)

## 2022-03-10 LAB — TESTOSTERONE, TOTAL, LC/MS/MS: Testosterone, total: 191.5 ng/dL — ABNORMAL LOW (ref 264.0–916.0)

## 2022-03-15 ENCOUNTER — Ambulatory Visit: Payer: Managed Care, Other (non HMO) | Admitting: Podiatry

## 2022-03-15 DIAGNOSIS — M722 Plantar fascial fibromatosis: Secondary | ICD-10-CM | POA: Diagnosis not present

## 2022-03-16 ENCOUNTER — Encounter: Payer: Self-pay | Admitting: Podiatry

## 2022-03-16 ENCOUNTER — Other Ambulatory Visit: Payer: Self-pay | Admitting: Endocrinology

## 2022-03-16 ENCOUNTER — Encounter: Payer: Self-pay | Admitting: Endocrinology

## 2022-03-16 MED ORDER — JATENZO 237 MG PO CAPS: ORAL_CAPSULE | ORAL | 3 refills | Status: DC

## 2022-03-17 ENCOUNTER — Other Ambulatory Visit: Payer: Self-pay | Admitting: Dermatology

## 2022-03-17 DIAGNOSIS — L700 Acne vulgaris: Secondary | ICD-10-CM

## 2022-03-22 LAB — MAGNESIUM: Magnesium: 2.4 mg/dL — ABNORMAL HIGH (ref 1.6–2.3)

## 2022-03-22 LAB — SPECIMEN STATUS REPORT

## 2022-03-22 NOTE — Progress Notes (Signed)
Subjective:  Patient ID: Kevin Ramos, male    DOB: July 20, 1995,  MRN: [id number]  Chief Complaint  Patient presents with   Plantar Fasciitis    26 y.o. male presents with the above complaint.  Patient presents with right heel pain/Planter fasciitis.  He states that the cam boot did not help much.  He denies any other acute complaints is causing him more pain to the heel.  Review of Systems: Negative except as noted in the HPI. Denies N/V/F/Ch.  Past Medical History:  Diagnosis Date   Acne    Depression    Dysplastic nevus 02/09/2019   right upper back, moderate atypia, close to margin.    Current Outpatient Medications:    acetaminophen (TYLENOL) 500 MG tablet, Take 500 mg by mouth every 6 (six) hours as needed., Disp: , Rfl:    acetaminophen-codeine (TYLENOL #3) 300-30 MG tablet, Take 1-2 tablets by mouth every 4 (four) hours as needed for moderate pain., Disp: 30 tablet, Rfl: 0   amphetamine-dextroamphetamine (ADDERALL) 5 MG tablet, Take 1 tablet (5 mg total) by mouth daily., Disp: 30 tablet, Rfl: 0   asenapine (SAPHRIS) 5 MG SUBL 24 hr tablet, Place under the tongue., Disp: , Rfl:    azelastine (ASTELIN) 0.1 % nasal spray, Place into the nose., Disp: , Rfl:    calcitRIOL (ROCALTROL) 0.5 MCG capsule, TAKE 1 CAPSULE BY MOUTH DAILY, Disp: 90 capsule, Rfl: 1   calcium carbonate (OSCAL) 1500 (600 Ca) MG TABS tablet, Take by mouth., Disp: , Rfl:    Calcium Carbonate-Vit D-Min (CALCIUM 1200 PO), Take 2 capsules by mouth daily. , Disp: , Rfl:    cetirizine (ZYRTEC) 10 MG tablet, Take by mouth., Disp: , Rfl:    Cholecalciferol (VITAMIN D) 50 MCG (2000 UT) tablet, Take 2,000 Units by mouth daily., Disp: , Rfl:    cyanocobalamin 1000 MCG tablet, Take by mouth., Disp: , Rfl:    DESCOVY 200-25 MG tablet, Take 1 tablet by mouth daily., Disp: , Rfl:    dexamethasone (DECADRON) 1 MG tablet, Take by mouth., Disp: , Rfl:    doxycycline (VIBRA-TABS) 100 MG tablet, TAKE 1 TABLET BY MOUTH  TWICE A DAY X 1 WEEK. THEN TAKE 1 TAB BY MOUTH DAILY X 3 WEEKS WITH FOOD, Disp: 35 tablet, Rfl: 0   DULoxetine (CYMBALTA) 30 MG capsule, Take 2 capsules in the morning and 1 capsule in the evening, Disp: 90 capsule, Rfl: 1   emtricitabine-tenofovir (TRUVADA) 200-300 MG tablet, Take 1 tablet by mouth daily., Disp: , Rfl:    ferrous sulfate 325 (65 FE) MG EC tablet, Take 1 tablet by mouth every other day., Disp: , Rfl:    fluticasone (FLONASE) 50 MCG/ACT nasal spray, Place into the nose., Disp: , Rfl:    hydrocortisone 2.5 % ointment, Apply to corner of mouth twice daily for up to 1 week., Disp: 30 g, Rfl: 0   ketoconazole (NIZORAL) 2 % cream, Apply to groin QD., Disp: 60 g, Rfl: 0   levothyroxine (SYNTHROID) 75 MCG tablet, TAKE ONE TABLET BY MOUTH EVERY MORNING BEFORE BREAKFAST, Disp: 90 tablet, Rfl: 0   lidocaine (LIDODERM) 5 %, 1 patch daily., Disp: , Rfl:    meloxicam (MOBIC) 15 MG tablet, TAKE 1 TABLET BY MOUTH EVERY DAY, Disp: 30 tablet, Rfl: 0   methylPREDNISolone (MEDROL DOSEPAK) 4 MG TBPK tablet, Take as directed, Disp: 21 each, Rfl: 0   mupirocin ointment (BACTROBAN) 2 %, Apply to skin qd-bid, Disp: 22 g, Rfl: 0  nabumetone (RELAFEN) 500 MG tablet, Take 500 mg by mouth 2 (two) times daily. (Patient not taking: Reported on 11/20/2021), Disp: , Rfl:    nabumetone (RELAFEN) 500 MG tablet, Take 1 tablet by mouth 2 (two) times daily., Disp: , Rfl:    nystatin (MYCOSTATIN) 100000 UNIT/ML suspension, Take by mouth., Disp: , Rfl:    potassium chloride (K-TAB) 8 MEQ tablet, Take 1 tablet (8 mEq total) by mouth 3 (three) times daily., Disp: 90 tablet, Rfl: 1   pregabalin (LYRICA) 75 MG capsule, Start with '75mg'$  capsule at bedtime and if doing well, may increase to twice a day after 5 days, Disp: , Rfl:    pregabalin (LYRICA) 75 MG capsule, Take 1 capsule by mouth 2 (two) times daily., Disp: , Rfl:    Testosterone Undecanoate (JATENZO) 198 MG CAPS, 1 tablet with breakfast and 1 at dinner, Disp: 60  capsule, Rfl: 5   Testosterone Undecanoate (JATENZO) 237 MG CAPS, Take 1 capsule at breakfast, continue 198 mg at dinnertime, Disp: 30 capsule, Rfl: 3   triamcinolone ointment (KENALOG) 0.1 %, Apply 1 application topically 2 (two) times daily as needed (Rash). Use for up to 2 weeks on affected areas of arms  Avoid applying to face, groin, and axilla. Use as directed. (Patient not taking: Reported on 11/20/2021), Disp: 60 g, Rfl: 1   Vitamin D, Ergocalciferol, (DRISDOL) 1.25 MG (50000 UNIT) CAPS capsule, Take 50,000 Units by mouth once a week., Disp: , Rfl:   Social History   Tobacco Use  Smoking Status Never  Smokeless Tobacco Never    Allergies  Allergen Reactions   Abilify [Aripiprazole] Nausea Only   Objective:  There were no vitals filed for this visit. There is no height or weight on file to calculate BMI. Constitutional Well developed. Well nourished.  Vascular Dorsalis pedis pulses palpable bilaterally. Posterior tibial pulses palpable bilaterally. Capillary refill normal to all digits.  No cyanosis or clubbing noted. Pedal hair growth normal.  Neurologic Normal speech. Oriented to person, place, and time. Epicritic sensation to light touch grossly present bilaterally.  Dermatologic Nails well groomed and normal in appearance. No open wounds. No skin lesions.  Orthopedic: Normal joint ROM without pain or crepitus bilaterally. No visible deformities. Tender to palpation at the calcaneal tuber right No pain with calcaneal squeeze right Ankle ROM diminished range of motion right Silf[place]old Test: positive right   Radiographs: Taken and reviewed. No acute fractures or dislocations. No evidence of stress fracture.  Plantar heel spur present. Posterior heel spur present.   Assessment:   No diagnosis found.  Plan:  Patient was evaluated and treated and all questions answered.  Plantar Fasciitis, right with underlying gastrocnemius equinus - XR reviewed as above.  -  Educated on icing and stretching. Instructions given.  - Injection delivered to the plantar fascia as below. - DME: Plantar Fascial Brace - Pharmacologic management: None -He will think about surgical options versus PRP injection.  He will get back to me when he is ready  Procedure: Injection Tendon/Ligament Location: Right plantar fascia at the glabrous junction; medial approach. Skin Prep: alcohol Injectate: 0.5 cc 0.5% marcaine plain, 0.5 cc of 1% Lidocaine, 0.5 cc kenalog 10. Disposition: Patient tolerated procedure well. Injection site dressed with a band-aid.  No follow-ups on file.

## 2022-03-23 ENCOUNTER — Telehealth: Payer: Self-pay

## 2022-03-23 ENCOUNTER — Other Ambulatory Visit (HOSPITAL_COMMUNITY): Payer: Self-pay

## 2022-03-23 NOTE — Telephone Encounter (Signed)
Pharmacy Patient Advocate Encounter   Received notification from [place] that prior authorization for [place] CAP is needed.    PA submitted on 03/23/22 [id number] Status is pending  Karie Soda, [place] Patient Advocate Specialist Direct Number: [phone] Fax: [phone]

## 2022-03-23 NOTE — Telephone Encounter (Signed)
Pharmacy Patient Advocate Encounter  Prior Authorization for [place] 237 MCG CAP has been approved.    PA# [id number] Effective dates: 03/23/22 through 03/23/23  Karie Soda, [place] Patient Advocate Specialist Direct Number: [phone] Fax: [phone]

## 2022-03-27 DIAGNOSIS — F3131 Bipolar disorder, current episode depressed, mild: Secondary | ICD-10-CM | POA: Diagnosis not present

## 2022-03-28 ENCOUNTER — Telehealth: Payer: Self-pay

## 2022-03-28 ENCOUNTER — Other Ambulatory Visit (HOSPITAL_COMMUNITY): Payer: Self-pay

## 2022-03-28 NOTE — Telephone Encounter (Signed)
Patient Advocate Encounter   Received notification from [place] that prior authorization is required for [place] CAP  Per patient insurance, this medication does not require a prior authorization at this time. Patient co-pay is $50.00 -  a manufacturer's coupon was applied to bring the co-pay to $10.00.If patient moves fills to a mail order pharmacy, 90 day fills will be at a reduced price.

## 2022-03-29 DIAGNOSIS — F411 Generalized anxiety disorder: Secondary | ICD-10-CM | POA: Diagnosis not present

## 2022-03-29 DIAGNOSIS — F3181 Bipolar II disorder: Secondary | ICD-10-CM | POA: Diagnosis not present

## 2022-03-29 DIAGNOSIS — F902 Attention-deficit hyperactivity disorder, combined type: Secondary | ICD-10-CM | POA: Diagnosis not present

## 2022-03-29 NOTE — Telephone Encounter (Signed)
Did we do this under the patients [place]?

## 2022-03-29 NOTE — Telephone Encounter (Signed)
MyChart message sent to inform the patient.

## 2022-04-01 ENCOUNTER — Other Ambulatory Visit: Payer: Self-pay | Admitting: Endocrinology

## 2022-04-02 DIAGNOSIS — M461 Sacroiliitis, not elsewhere classified: Secondary | ICD-10-CM | POA: Diagnosis not present

## 2022-04-02 DIAGNOSIS — M797 Fibromyalgia: Secondary | ICD-10-CM | POA: Diagnosis not present

## 2022-04-02 DIAGNOSIS — R5382 Chronic fatigue, unspecified: Secondary | ICD-10-CM | POA: Diagnosis not present

## 2022-04-02 DIAGNOSIS — Z79899 Other long term (current) drug therapy: Secondary | ICD-10-CM | POA: Diagnosis not present

## 2022-04-03 ENCOUNTER — Other Ambulatory Visit: Payer: Self-pay | Admitting: Endocrinology

## 2022-04-04 ENCOUNTER — Other Ambulatory Visit: Payer: Self-pay | Admitting: Endocrinology

## 2022-04-04 ENCOUNTER — Telehealth: Payer: Self-pay

## 2022-04-04 MED ORDER — JATENZO 198 MG PO CAPS: ORAL_CAPSULE | ORAL | 5 refills | Status: DC

## 2022-04-04 NOTE — Telephone Encounter (Signed)
Patient needs refill on the 198 mg Jatenzo sent to [facility] on [address].

## 2022-04-05 ENCOUNTER — Telehealth: Payer: Self-pay

## 2022-04-05 NOTE — Telephone Encounter (Signed)
Patient needs a PA on Jatenzo '237mg'$  for [employer] has been updated in chart last PA was done under old insurance. Please expedite if possible

## 2022-04-09 ENCOUNTER — Other Ambulatory Visit (HOSPITAL_COMMUNITY): Payer: Self-pay

## 2022-04-09 NOTE — Telephone Encounter (Signed)
Pharmacy Patient Advocate Encounter   Received notification from [place] that prior authorization for Jantenzo '237mg'$  is required/requested.  Per Test Claim: medication is covered. $50 copay or $10 after evoucher.   Called the pharmacy. They do get a PA rejection, but it's likely because they filled the '198mg'$  strength last week. They will call ins to see if they can fill it for a dose change. They will let me know if that doesn't work.

## 2022-04-12 DIAGNOSIS — M62830 Muscle spasm of back: Secondary | ICD-10-CM | POA: Diagnosis not present

## 2022-04-12 DIAGNOSIS — R519 Headache, unspecified: Secondary | ICD-10-CM | POA: Diagnosis not present

## 2022-04-12 DIAGNOSIS — M9903 Segmental and somatic dysfunction of lumbar region: Secondary | ICD-10-CM | POA: Diagnosis not present

## 2022-04-12 DIAGNOSIS — M9901 Segmental and somatic dysfunction of cervical region: Secondary | ICD-10-CM | POA: Diagnosis not present

## 2022-04-16 DIAGNOSIS — F3131 Bipolar disorder, current episode depressed, mild: Secondary | ICD-10-CM | POA: Diagnosis not present

## 2022-04-17 DIAGNOSIS — R7989 Other specified abnormal findings of blood chemistry: Secondary | ICD-10-CM | POA: Diagnosis not present

## 2022-04-17 DIAGNOSIS — E039 Hypothyroidism, unspecified: Secondary | ICD-10-CM | POA: Diagnosis not present

## 2022-04-24 DIAGNOSIS — G4733 Obstructive sleep apnea (adult) (pediatric): Secondary | ICD-10-CM | POA: Diagnosis not present

## 2022-05-04 ENCOUNTER — Other Ambulatory Visit: Payer: Self-pay | Admitting: Dermatology

## 2022-05-04 DIAGNOSIS — L700 Acne vulgaris: Secondary | ICD-10-CM

## 2022-05-07 DIAGNOSIS — F3131 Bipolar disorder, current episode depressed, mild: Secondary | ICD-10-CM | POA: Diagnosis not present

## 2022-05-10 ENCOUNTER — Ambulatory Visit: Payer: Managed Care, Other (non HMO) | Admitting: Dermatology

## 2022-05-16 DIAGNOSIS — R519 Headache, unspecified: Secondary | ICD-10-CM | POA: Diagnosis not present

## 2022-05-16 DIAGNOSIS — M9901 Segmental and somatic dysfunction of cervical region: Secondary | ICD-10-CM | POA: Diagnosis not present

## 2022-05-16 DIAGNOSIS — M62830 Muscle spasm of back: Secondary | ICD-10-CM | POA: Diagnosis not present

## 2022-05-16 DIAGNOSIS — M9903 Segmental and somatic dysfunction of lumbar region: Secondary | ICD-10-CM | POA: Diagnosis not present

## 2022-05-23 DIAGNOSIS — G4733 Obstructive sleep apnea (adult) (pediatric): Secondary | ICD-10-CM | POA: Diagnosis not present

## 2022-05-24 ENCOUNTER — Encounter: Payer: Self-pay | Admitting: Endocrinology

## 2022-05-24 ENCOUNTER — Other Ambulatory Visit: Payer: Self-pay | Admitting: Endocrinology

## 2022-05-24 ENCOUNTER — Ambulatory Visit: Payer: BC Managed Care – PPO | Admitting: Dermatology

## 2022-05-24 ENCOUNTER — Other Ambulatory Visit: Payer: Self-pay

## 2022-05-24 VITALS — BP 117/74 | HR 94

## 2022-05-24 DIAGNOSIS — Z7189 Other specified counseling: Secondary | ICD-10-CM | POA: Diagnosis not present

## 2022-05-24 DIAGNOSIS — L814 Other melanin hyperpigmentation: Secondary | ICD-10-CM

## 2022-05-24 DIAGNOSIS — L219 Seborrheic dermatitis, unspecified: Secondary | ICD-10-CM

## 2022-05-24 DIAGNOSIS — Z1283 Encounter for screening for malignant neoplasm of skin: Secondary | ICD-10-CM

## 2022-05-24 DIAGNOSIS — L700 Acne vulgaris: Secondary | ICD-10-CM

## 2022-05-24 DIAGNOSIS — E230 Hypopituitarism: Secondary | ICD-10-CM

## 2022-05-24 DIAGNOSIS — D225 Melanocytic nevi of trunk: Secondary | ICD-10-CM

## 2022-05-24 DIAGNOSIS — L821 Other seborrheic keratosis: Secondary | ICD-10-CM

## 2022-05-24 DIAGNOSIS — Z86018 Personal history of other benign neoplasm: Secondary | ICD-10-CM

## 2022-05-24 DIAGNOSIS — Z79899 Other long term (current) drug therapy: Secondary | ICD-10-CM

## 2022-05-24 DIAGNOSIS — D229 Melanocytic nevi, unspecified: Secondary | ICD-10-CM

## 2022-05-24 DIAGNOSIS — D485 Neoplasm of uncertain behavior of skin: Secondary | ICD-10-CM

## 2022-05-24 DIAGNOSIS — L578 Other skin changes due to chronic exposure to nonionizing radiation: Secondary | ICD-10-CM

## 2022-05-24 MED ORDER — KETOCONAZOLE 2 % EX SHAM: 1.0000 | MEDICATED_SHAMPOO | CUTANEOUS | 11 refills | Status: AC

## 2022-05-24 MED ORDER — ISOTRETINOIN 20 MG PO CAPS: 20.0000 mg | ORAL_CAPSULE | Freq: Every day | ORAL | 0 refills | Status: DC

## 2022-05-24 NOTE — Progress Notes (Signed)
Follow-Up Visit   Subjective  Kevin Ramos is a 27 y.o. male who presents for the following: Acne (Vs Rosacea  Neck, face, chest, 89mf/u, Doxycycline x 125mhen d/c and restarted 1 wk ago, pt doesn't feel the Doxycycline works), Intetrigo (Groin, cleared with Ketoconazole 2% cr), Total body skin exam (Hx of Dysplastic Nevus R upper back), and Cyst (R arm had I&D with another provider 12/2021).  The following portions of the chart were reviewed this encounter and updated as appropriate:   Tobacco  Allergies  Meds  Problems  Med Hx  Surg Hx  Fam Hx     Review of Systems:  No other skin or systemic complaints except as noted in HPI or Assessment and Plan.  Objective  Well appearing patient in no apparent distress; mood and affect are within normal limits.  A full examination was performed including scalp, head, eyes, ears, nose, lips, neck, chest, axillae, abdomen, back, buttocks, bilateral upper extremities, bilateral lower extremities, hands, feet, fingers, toes, fingernails, and toenails. All findings within normal limits unless otherwise noted below.  Scalp Scale  Post neck, chest, face Crusted papules  Left inf side above waistline 0.6 cm irregular brown macule   Assessment & Plan   History of Dysplastic Nevi - No evidence of recurrence today - Recommend regular full body skin exams - Recommend daily broad spectrum sunscreen SPF 30+ to sun-exposed areas, reapply every 2 hours as needed.  - Call if any new or changing lesions are noted between office visits  Lentigines - Scattered tan macules - Due to sun exposure - Benign-appearing, observe - Recommend daily broad spectrum sunscreen SPF 30+ to sun-exposed areas, reapply every 2 hours as needed. - Call for any changes  Seborrheic Keratoses - Stuck-on, waxy, tan-brown papules and/or plaques  - Benign-appearing - Discussed benign etiology and prognosis. - Observe - Call for any changes  Melanocytic  Nevi - Tan-brown and/or pink-flesh-colored symmetric macules and papules - Benign appearing on exam today - Observation - Call clinic for new or changing moles - Recommend daily use of broad spectrum spf 30+ sunscreen to sun-exposed areas.   Hemangiomas - Red papules - Discussed benign nature - Observe - Call for any changes  Actinic Damage - Chronic condition, secondary to cumulative UV/sun exposure - diffuse scaly erythematous macules with underlying dyspigmentation - Recommend daily broad spectrum sunscreen SPF 30+ to sun-exposed areas, reapply every 2 hours as needed.  - Staying in the shade or wearing long sleeves, sun glasses (UVA+UVB protection) and wide brim hats (4-inch brim around the entire circumference of the hat) are also recommended for sun protection.  - Call for new or changing lesions.  Skin cancer screening performed today.  Seborrheic dermatitis Scalp Seborrheic Dermatitis  -  is a chronic persistent rash characterized by pinkness and scaling most commonly of the mid face but also can occur on the scalp (dandruff), ears; mid chest, mid back and groin.  It tends to be exacerbated by stress and cooler weather.  People who have neurologic disease may experience new onset or exacerbation of existing seborrheic dermatitis.  The condition is not curable but treatable and can be controlled.  ketoconazole (NIZORAL) 2 % shampoo - Scalp Apply 1 Application topically as directed. Shampoo scalp 3 times per week  Acne vulgaris Post neck, chest, face Discussed restarting isotretinoin. Patient is interested in restarting. Advised him to discontinue doxycycline.  Labs from 01/2022 and 02/2022 reviewed - all WNL to start Isotretinoin.  Patient  reregistered in [employer]. Ipledge # [id number]  Start Isotretinoin 20 mg 1 po qd  ISOtretinoin (ACCUTANE) 20 MG capsule - Post neck, chest, face Take 1 capsule (20 mg total) by mouth daily. Ipledge [id number]  Neoplasm of  uncertain behavior of skin Left inf side above waistline Epidermal / dermal shaving  Lesion diameter (cm):  0.6 Informed consent: discussed and consent obtained   Timeout: patient name, date of birth, surgical site, and procedure verified   Procedure prep:  Patient was prepped and draped in usual sterile fashion Prep type:  Isopropyl alcohol Anesthesia: the lesion was anesthetized in a standard fashion   Anesthetic:  1% lidocaine w/ epinephrine 1-100,000 buffered w/ 8.4% NaHCO3 Instrument used: flexible razor blade   Hemostasis achieved with: pressure, aluminum chloride and electrodesiccation   Outcome: patient tolerated procedure well   Post-procedure details: sterile dressing applied and wound care instructions given   Dressing type: bandage and petrolatum    Specimen 1 - Surgical pathology Differential Diagnosis: Nevus vs dysplastic nevus Check Margins: No  Return in about 30 days (around 06/23/2022) for Isotretinoin.  I, Ashok Cordia, CMA, am acting as scribe for Sarina Ser, MD . Documentation: I have reviewed the above documentation for accuracy and completeness, and I agree with the above.  Sarina Ser, MD

## 2022-05-24 NOTE — Patient Instructions (Addendum)
Shave Excision Benign Lesion Wound Care Instructions  Leave the original bandage on for 24 hours if possible.  If the bandage becomes soaked or soiled before that time, it is OK to remove it and examine the wound.  A small amount of post-operative bleeding is normal.  If excessive bleeding occurs, remove the bandage, place gauze over the site and apply continuous pressure (no peeking) over the area for 20-30 minutes.  If this does not stop the bleeding, try again for 40 minutes.  If this does not work, please call our clinic as soon as possible (even if after-hours).    Twice a day, cleanse the wound with soap and water.  If a thick crust develops you may use a Q-tip dipped into dilute hydrogen peroxide (mix 1:1 with water) to dissolve it.  Hydrogen peroxide can slow the healing process, so use it only as needed.  After washing, apply Vaseline jelly or Polysporin ointment.  For best healing, the wound should be covered with a layer of ointment at all times.  This may mean re-applying the ointment several times a day.  For open wounds, continue until it has healed.    If you have any swelling, keep the area elevated.  Some redness, tenderness and white or yellow material in the wound is normal healing.  If the area becomes very sore and red, or develops a thick yellow-green material (pus), it may be infected; please notify us.    Wound healing continues for up to one year following surgery.  It is not unusual to experience pain in the scar from time to time during the interval.  If the pain becomes severe or the scar thickens, you should notify the office.  A slight amount of redness in a scar is expected for the first six months.  After six months, the redness subsides and the scar will soften and fade.  The color difference becomes less noticeable with time.  If there are any problems, return for a post-op surgery check at your earliest convenience.  Please call our office for any questions or  concerns.      Due to recent changes in healthcare laws, you may see results of your pathology and/or laboratory studies on MyChart before the doctors have had a chance to review them. We understand that in some cases there may be results that are confusing or concerning to you. Please understand that not all results are received at the same time and often the doctors may need to interpret multiple results in order to provide you with the best plan of care or course of treatment. Therefore, we ask that you please give [place] 2 business days to thoroughly review all your results before contacting the office for clarification. Should we see a critical lab result, you will be contacted sooner.   If You Need Anything After Your Visit  If you have any questions or concerns for your doctor, please call our main line at [phone] and press option 4 to reach your doctor's medical assistant. If no one answers, please leave a voicemail as directed and we will return your call as soon as possible. Messages left after 4 pm will be answered the following business day.   You may also send [place] a message via [place]. We typically respond to MyChart messages within 1-2 business days.  For prescription refills, please ask your pharmacy to contact our office. Our fax number is [phone].  If you have an urgent issue when the  clinic is closed that cannot wait until the next business day, you can page your doctor at the number below.    Please note that while we do our best to be available for urgent issues outside of office hours, we are not available 24/7.   If you have an urgent issue and are unable to reach [place], you may choose to seek medical care at your doctor's office, retail clinic, urgent care center, or emergency room.  If you have a medical emergency, please immediately call 911 or go to the emergency department.  Pager Numbers  - Dr. Nehemiah Massed: [phone]  - Dr. Laurence Ferrari: [phone]  - Dr.  Nicole Kindred: [phone]  In the event of inclement weather, please call our main line at [phone] for an update on the status of any delays or closures.  Dermatology Medication Tips: Please keep the boxes that topical medications come in in order to help keep track of the instructions about where and how to use these. Pharmacies typically print the medication instructions only on the boxes and not directly on the medication tubes.   If your medication is too expensive, please contact our office at [phone] option 4 or send [place] a message through [place].   We are unable to tell what your co-pay for medications will be in advance as this is different depending on your insurance coverage. However, we may be able to find a substitute medication at lower cost or fill out paperwork to get insurance to cover a needed medication.   If a prior authorization is required to get your medication covered by your insurance company, please allow [place] 1-2 business days to complete this process.  Drug prices often vary depending on where the prescription is filled and some pharmacies may offer cheaper prices.  The website www.goodrx.com contains coupons for medications through different pharmacies. The prices here do not account for what the cost may be with help from insurance (it may be cheaper with your insurance), but the website can give you the price if you did not use any insurance.  - You can print the associated coupon and take it with your prescription to the pharmacy.  - You may also stop by our office during regular business hours and pick up a GoodRx coupon card.  - If you need your prescription sent electronically to a different pharmacy, notify our office through [facility] or by phone at [phone] option 4.     Si Usted Necesita Algo Despus de Su Visita  Tambin puede enviarnos un mensaje a travs de Pharmacist, community. Por lo general respondemos a los mensajes de MyChart en el transcurso  de 1 a 2 das hbiles.  Para renovar recetas, por favor pida a su farmacia que se ponga en contacto con nuestra oficina. Harland Dingwall de fax es [place] [phone].  Si tiene un asunto urgente cuando la clnica est cerrada y que no puede esperar hasta el siguiente da hbil, puede llamar/localizar a su doctor(a) al nmero que aparece a continuacin.   Por favor, tenga en cuenta que aunque hacemos todo lo posible para estar disponibles para asuntos urgentes fuera del horario de [place], no estamos disponibles las 24 horas del da, los 7 das de la [place].   Si tiene un problema urgente y no puede comunicarse con nosotros, puede optar por buscar atencin mdica  en el consultorio de su doctor(a), en una clnica privada, en un centro de atencin urgente o en una sala de emergencias.  Si tiene [employer]  emergencia mdica, por favor llame inmediatamente al 911 o vaya a la sala de emergencias.  Nmeros de bper  - Dr. Nehemiah Massed: [phone]  - Dra. Moye: [phone]  - Dra. Nicole Kindred: [phone]  En caso de inclemencias del [place], por favor llame a Johnsie Kindred principal al [phone] para una actualizacin sobre el [place] de cualquier retraso o cierre.  Consejos para la medicacin en dermatologa: Por favor, guarde las cajas en las que vienen los medicamentos de uso tpico para ayudarle a seguir las instrucciones sobre dnde y cmo usarlos. Las farmacias generalmente imprimen las instrucciones del medicamento slo en las cajas y no directamente en los tubos del [place].   Si su medicamento es muy caro, por favor, pngase en contacto con Zigmund Daniel llamando al [phone] y presione la opcin 4 o envenos un mensaje a travs de Pharmacist, community.   No podemos decirle cul ser su copago por los medicamentos por adelantado ya que esto es diferente dependiendo de la cobertura de su seguro. Sin embargo, es posible que podamos encontrar un medicamento sustituto a Electrical engineer un formulario  para que el seguro cubra el medicamento que se considera necesario.   Si se requiere una autorizacin previa para que su compaa de seguros [place] su medicamento, por favor permtanos de 1 a 2 das hbiles para completar este proceso.  Los precios de los medicamentos varan con frecuencia dependiendo del Environmental consultant de dnde se surte la receta y alguna farmacias pueden ofrecer precios ms baratos.  El sitio web www.goodrx.com tiene cupones para medicamentos de Airline pilot. Los precios aqu no tienen en cuenta lo que podra costar con la ayuda del seguro (puede ser ms barato con su seguro), pero el sitio web puede darle el precio si no utiliz Research scientist (physical sciences).  - Puede imprimir el cupn correspondiente y llevarlo con su receta a la farmacia.  - Tambin puede pasar por nuestra oficina durante el horario de atencin regular y Charity fundraiser una tarjeta de cupones de GoodRx.  - Si necesita que su receta se enve electrnicamente a una farmacia diferente, informe a nuestra oficina a travs de MyChart de [place] o por telfono llamando al [phone] y presione la opcin 4.

## 2022-05-28 DIAGNOSIS — F3131 Bipolar disorder, current episode depressed, mild: Secondary | ICD-10-CM | POA: Diagnosis not present

## 2022-05-29 ENCOUNTER — Telehealth: Payer: Self-pay

## 2022-05-29 NOTE — Telephone Encounter (Signed)
Discussed pathology results. Patient voiced understanding.

## 2022-05-29 NOTE — Telephone Encounter (Signed)
-----   Message from Ralene Bathe, MD sent at 05/29/2022  9:30 AM EST ----- Diagnosis Skin , left inf side above waistline DYSPLASTIC COMPOUND NEVUS WITH MODERATE ATYPIA, CLOSE TO MARGIN  Moderate dysplastic Recheck next visit

## 2022-06-01 ENCOUNTER — Encounter: Payer: Self-pay | Admitting: Dermatology

## 2022-06-06 DIAGNOSIS — E669 Obesity, unspecified: Secondary | ICD-10-CM | POA: Diagnosis not present

## 2022-06-06 DIAGNOSIS — R1013 Epigastric pain: Secondary | ICD-10-CM | POA: Diagnosis not present

## 2022-06-06 DIAGNOSIS — Z79899 Other long term (current) drug therapy: Secondary | ICD-10-CM | POA: Diagnosis not present

## 2022-06-06 DIAGNOSIS — M1611 Unilateral primary osteoarthritis, right hip: Secondary | ICD-10-CM | POA: Diagnosis not present

## 2022-06-06 DIAGNOSIS — Z79890 Hormone replacement therapy (postmenopausal): Secondary | ICD-10-CM | POA: Diagnosis not present

## 2022-06-06 DIAGNOSIS — E039 Hypothyroidism, unspecified: Secondary | ICD-10-CM | POA: Diagnosis not present

## 2022-06-06 DIAGNOSIS — K3189 Other diseases of stomach and duodenum: Secondary | ICD-10-CM | POA: Diagnosis not present

## 2022-06-06 DIAGNOSIS — K2950 Unspecified chronic gastritis without bleeding: Secondary | ICD-10-CM | POA: Diagnosis not present

## 2022-06-06 DIAGNOSIS — R6881 Early satiety: Secondary | ICD-10-CM | POA: Diagnosis not present

## 2022-06-06 DIAGNOSIS — Z6828 Body mass index (BMI) 28.0-28.9, adult: Secondary | ICD-10-CM | POA: Diagnosis not present

## 2022-06-06 DIAGNOSIS — G4730 Sleep apnea, unspecified: Secondary | ICD-10-CM | POA: Diagnosis not present

## 2022-06-06 DIAGNOSIS — M810 Age-related osteoporosis without current pathological fracture: Secondary | ICD-10-CM | POA: Diagnosis not present

## 2022-06-07 ENCOUNTER — Encounter: Payer: Self-pay | Admitting: Dermatology

## 2022-06-08 ENCOUNTER — Ambulatory Visit: Payer: Managed Care, Other (non HMO) | Admitting: Endocrinology

## 2022-06-08 DIAGNOSIS — K5909 Other constipation: Secondary | ICD-10-CM | POA: Diagnosis not present

## 2022-06-08 DIAGNOSIS — R748 Abnormal levels of other serum enzymes: Secondary | ICD-10-CM | POA: Diagnosis not present

## 2022-06-08 DIAGNOSIS — R6881 Early satiety: Secondary | ICD-10-CM | POA: Diagnosis not present

## 2022-06-08 DIAGNOSIS — K635 Polyp of colon: Secondary | ICD-10-CM | POA: Diagnosis not present

## 2022-06-11 ENCOUNTER — Other Ambulatory Visit: Payer: Self-pay | Admitting: Physician Assistant

## 2022-06-11 DIAGNOSIS — R6881 Early satiety: Secondary | ICD-10-CM

## 2022-06-12 ENCOUNTER — Other Ambulatory Visit: Payer: Self-pay | Admitting: Endocrinology

## 2022-06-12 DIAGNOSIS — E230 Hypopituitarism: Secondary | ICD-10-CM | POA: Diagnosis not present

## 2022-06-12 DIAGNOSIS — E876 Hypokalemia: Secondary | ICD-10-CM

## 2022-06-12 DIAGNOSIS — E201 Pseudohypoparathyroidism: Secondary | ICD-10-CM

## 2022-06-12 DIAGNOSIS — R7989 Other specified abnormal findings of blood chemistry: Secondary | ICD-10-CM | POA: Diagnosis not present

## 2022-06-13 LAB — BASIC METABOLIC PANEL
BUN/Creatinine Ratio: 7 — ABNORMAL LOW (ref 9–20)
BUN: 6 mg/dL (ref 6–20)
CO2: 23 mmol/L (ref 20–29)
Calcium: 8.4 mg/dL — ABNORMAL LOW (ref 8.7–10.2)
Chloride: 97 mmol/L (ref 96–106)
Creatinine, Ser: 0.92 mg/dL (ref 0.76–1.27)
Glucose: 109 mg/dL — ABNORMAL HIGH (ref 70–99)
Potassium: 3.4 mmol/L — ABNORMAL LOW (ref 3.5–5.2)
Sodium: 140 mmol/L (ref 134–144)
eGFR: 116 mL/min/{1.73_m2} (ref >59)

## 2022-06-13 LAB — MAGNESIUM: Magnesium: 2.2 mg/dL (ref 1.6–2.3)

## 2022-06-14 ENCOUNTER — Ambulatory Visit (INDEPENDENT_AMBULATORY_CARE_PROVIDER_SITE_OTHER): Payer: BC Managed Care – PPO | Admitting: Endocrinology

## 2022-06-14 ENCOUNTER — Encounter: Payer: Self-pay | Admitting: Endocrinology

## 2022-06-14 VITALS — BP 112/80 | HR 101 | Ht 69.5 in | Wt 197.8 lb

## 2022-06-14 DIAGNOSIS — E038 Other specified hypothyroidism: Secondary | ICD-10-CM

## 2022-06-14 DIAGNOSIS — E230 Hypopituitarism: Secondary | ICD-10-CM | POA: Diagnosis not present

## 2022-06-14 DIAGNOSIS — E876 Hypokalemia: Secondary | ICD-10-CM

## 2022-06-14 NOTE — Patient Instructions (Signed)
Take 2 potassium in am and 1 in pm

## 2022-06-14 NOTE — Progress Notes (Signed)
Patient ID: Kevin Ramos, male   DOB: [birth year], 27 y.o.   MRN: [id number]             Chief complaint: Endocrinology follow-up  History of Present Illness:  Problem 1: HYPOCALCEMIA  RECENT history:  Since 4/21 he has been on 0.5 mcg of calcitriol, previously had required larger doses Also takes 2 tablets of 1200 mg calcium once or twice a day  He has taken his regimen very regularly as prescribed  Usually has complaints of tingling in the hands or face cramping or stiffness associated with low calcium levels  No tingling sensations in the face or hands or cramping in the muscles  Calcium appears to be slightly below normal Again he may have missed a couple of doses of his medications   Lab Results  Component Value Date   CALCIUM 8.4 (L) 06/12/2022   CALCIUM 8.3 (L) 03/06/2022   CALCIUM 9.3 07/25/2021    VITAMIN D deficiency: He is taking vitamin D3, 2000 units daily   Lab Results  Component Value Date   VD25OH 20.83 (L) 03/25/2019   VD25OH 30.7 10/29/2017   VD25OH 41.22 12/27/2016   HYPOTHYROIDISM:  He had thyroid functions checked because of fatigue in 12/20 Free T4 was low normal at 0.63 as of 2/21 With his TSH being mildly increased he was told to start taking levothyroxine 25 mcg daily This was subsequently increased  Previously had symptoms of fatigue and sleepiness Has not felt any better lately  He is continuing on 75 mcg levothyroxine since 9/21 Free T4 normal   Lab Results  Component Value Date   TSH 3.85 04/12/2021   TSH 4.06 02/14/2021   TSH 1.92 07/20/2020   FREET4 1.21 03/06/2022   FREET4 1.22 07/25/2021   FREET4 0.66 04/12/2021   Problem 2: HYPOGONADISM:  Because of his significant fatigue, lack of energy and motivation testosterone was checked and free testosterone was low on 2 occasions LH normal along with prolactin  Previously was consistently using his 4 mg Androderm and was able to continue despite some scaling  irritation He was switched to AndroGel because of insurance preference  Because of continued fatigue and low testosterone levels persistently with AndroGel higher doses he was switched to [place]  Currently now taking 237 mg twice a day  Last hemoglobin was 16 in January   Nat[place] was stopped because he says it gives him recurrent sinus infections He still feels fatigued with no improvement  Testosterone level pending His hemoglobin is normal   Lab Results  Component Value Date   TESTOSTERONE 191.5 (L) 03/06/2022   TESTOSTERONE 162 (L) 09/25/2021   TESTOSTERONE 262 (L) 07/25/2021   TESTOSTERONE 227.1 (L) 06/09/2021   TESTOSTERONE 271.44 (L) 04/12/2021   Lab Results  Component Value Date   TESTOFREE 6.7 (L) 05/05/2019   TESTOFREE 7.5 (L) 04/30/2019   Lab Results  Component Value Date   HGB 16.8 03/06/2022      Past Medical History:  Diagnosis Date   Acne    Depression    Dysplastic nevus 02/09/2019   right upper back, moderate atypia, close to margin.   Dysplastic nevus 05/24/2022   Left inferior side above waistline. Moderate atypia, closed to but does not involve margin.    No past surgical history on file.  Family History  Problem Relation Age of Onset   Hypoparathyroidism Mother     Social History:  reports that he has never smoked. He has never used smokeless tobacco. He  reports that he does not drink alcohol and does not use drugs.   Allergies:  Allergies  Allergen Reactions   Abilify [Aripiprazole] Nausea Only    Allergies as of 06/14/2022       Reactions   Abilify [aripiprazole] Nausea Only        Medication List        Accurate as of June 14, 2022  8:36 AM. If you have any questions, ask your nurse or doctor.          acetaminophen 500 MG tablet Commonly known as: TYLENOL Take 500 mg by mouth every 6 (six) hours as needed.   acetaminophen-codeine 300-30 MG tablet Commonly known as: TYLENOL #3 Take 1-2 tablets by mouth every 4  (four) hours as needed for moderate pain.   amphetamine-dextroamphetamine 5 MG tablet Commonly known as: Adderall Take 1 tablet (5 mg total) by mouth daily.   asenapine 5 MG Subl 24 hr tablet Commonly known as: [place] under the tongue.   azelastine 0.1 % nasal spray Commonly known as: ASTELIN Place into the nose.   calcitRIOL 0.5 MCG capsule Commonly known as: ROCALTROL TAKE 1 CAPSULE BY MOUTH DAILY   CALCIUM 1200 PO Take 2 capsules by mouth daily.   calcium carbonate 1500 (600 Ca) MG Tabs tablet Commonly known as: OSCAL Take by mouth.   cetirizine 10 MG tablet Commonly known as: ZYRTEC Take by mouth.   cyanocobalamin 1000 MCG tablet Take by mouth.   Descovy 200-25 MG tablet Generic drug: emtricitabine-tenofovir AF Take 1 tablet by mouth daily.   dexamethasone 1 MG tablet Commonly known as: DECADRON Take by mouth.   DULoxetine 30 MG capsule Commonly known as: Cymbalta Take 2 capsules in the morning and 1 capsule in the evening   emtricitabine-tenofovir 200-300 MG tablet Commonly known as: TRUVADA Take 1 tablet by mouth daily.   ferrous sulfate 325 (65 FE) MG EC tablet Take 1 tablet by mouth every other day.   fluticasone 50 MCG/ACT nasal spray Commonly known as: FLONASE Place into the nose.   hydrocortisone 2.5 % ointment Apply to corner of mouth twice daily for up to 1 week.   ISOtretinoin 20 MG capsule Commonly known as: ACCUTANE Take 1 capsule (20 mg total) by mouth daily. Ipledge [id number]   Jatenzo 237 MG Caps Generic drug: Testosterone Undecanoate Take 1 capsule at breakfast, continue 198 mg at dinnertime   Jatenzo 198 MG Caps Generic drug: Testosterone Undecanoate 1 tablet at dinner along with 237 mg dose at breakfast   ketoconazole 2 % cream Commonly known as: NIZORAL Apply to groin QD.   ketoconazole 2 % shampoo Commonly known as: NIZORAL Apply 1 Application topically as directed. Shampoo scalp 3 times per week    levothyroxine 75 MCG tablet Commonly known as: SYNTHROID TAKE ONE TABLET BY MOUTH EVERY MORNING BEFORE BREAKFAST   lidocaine 5 % Commonly known as: LIDODERM 1 patch daily.   meloxicam 15 MG tablet Commonly known as: MOBIC TAKE 1 TABLET BY MOUTH EVERY DAY   methylPREDNISolone 4 MG Tbpk tablet Commonly known as: MEDROL DOSEPAK Take as directed   mupirocin ointment 2 % Commonly known as: BACTROBAN Apply to skin qd-bid   nabumetone 500 MG tablet Commonly known as: RELAFEN Take 500 mg by mouth 2 (two) times daily.   nabumetone 500 MG tablet Commonly known as: RELAFEN Take 1 tablet by mouth 2 (two) times daily.   nystatin 100000 UNIT/ML suspension Commonly known as: MYCOSTATIN Take by mouth.   potassium  chloride 8 MEQ tablet Commonly known as: KLOR-CON TAKE 1 TABLET (8 MEQ TOTAL) BY MOUTH 3 (THREE) TIMES DAILY.   pregabalin 75 MG capsule Commonly known as: LYRICA Start with 75mg  capsule at bedtime and if doing well, may increase to twice a day after 5 days   pregabalin 75 MG capsule Commonly known as: LYRICA Take 1 capsule by mouth 2 (two) times daily.   triamcinolone ointment 0.1 % Commonly known as: KENALOG Apply 1 application topically 2 (two) times daily as needed (Rash). Use for up to 2 weeks on affected areas of arms  Avoid applying to face, groin, and axilla. Use as directed.   Vitamin D (Ergocalciferol) 1.25 MG (50000 UNIT) Caps capsule Commonly known as: DRISDOL Take 50,000 Units by mouth once a week.   Vitamin D 50 MCG (2000 UT) tablet Take 2,000 Units by mouth daily.            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  Currently supplemented with Jatenzo 237 mg twice a day He has persistent fatigue which is likely unrelated Testosterone level is still pending  Hypoparathyroidism: Currently asymptomatic, as before his calcium is slightly below normal which is acceptable  HYPOKALEMIA: This is unexplained except possibly from renal loss Potassium is improved but still relatively low with 2 tablets of 8 mg potassium  PLAN:   Decide on dose of Jatenzo based on his testosterone level However may need to consider injectable drugs if this is not adequate Will need to watch his hematocrit also at the same time He needs to try and take his calcium and calcitriol more regularly as prescribed Since blood pressure is low normal may not be a candidate for Aldactone; also consider checking urine potassium Increase potassium supplements to 3 tablets daily Follow-up in 3 months   Cherrie Franca 06/14/2022, 8:36 AM

## 2022-06-15 DIAGNOSIS — R21 Rash and other nonspecific skin eruption: Secondary | ICD-10-CM | POA: Diagnosis not present

## 2022-06-15 DIAGNOSIS — N489 Disorder of penis, unspecified: Secondary | ICD-10-CM | POA: Diagnosis not present

## 2022-06-15 LAB — TESTOSTERONE, TOTAL, LC/MS/MS: Testosterone, total: 162.9 ng/dL — ABNORMAL LOW (ref 264.0–916.0)

## 2022-06-16 NOTE — Progress Notes (Signed)
Testosterone level is still low, recommend switching to [facility] testosterone injections 75 mg weekly, need to see if this needs a PA from his insurance, has failed other testosterone supplements

## 2022-06-17 ENCOUNTER — Other Ambulatory Visit: Payer: Self-pay | Admitting: Dermatology

## 2022-06-17 DIAGNOSIS — L700 Acne vulgaris: Secondary | ICD-10-CM

## 2022-06-18 ENCOUNTER — Telehealth: Payer: Self-pay | Admitting: Pharmacy Technician

## 2022-06-18 ENCOUNTER — Encounter: Payer: Self-pay | Admitting: Endocrinology

## 2022-06-18 ENCOUNTER — Other Ambulatory Visit (HOSPITAL_COMMUNITY): Payer: Self-pay

## 2022-06-18 DIAGNOSIS — R519 Headache, unspecified: Secondary | ICD-10-CM | POA: Diagnosis not present

## 2022-06-18 DIAGNOSIS — M9903 Segmental and somatic dysfunction of lumbar region: Secondary | ICD-10-CM | POA: Diagnosis not present

## 2022-06-18 DIAGNOSIS — M9901 Segmental and somatic dysfunction of cervical region: Secondary | ICD-10-CM | POA: Diagnosis not present

## 2022-06-18 DIAGNOSIS — M62830 Muscle spasm of back: Secondary | ICD-10-CM | POA: Diagnosis not present

## 2022-06-18 NOTE — Telephone Encounter (Signed)
Pharmacy Patient Advocate Encounter   Received notification from Result notes/MD that prior authorization for Xyosted 75mg  is required/requested.   PA submitted on 06/18/22 to (ins) [employer]/An[employer] Commercial/caremark via CoverMyMeds Key or (Medicaid) confirmation # [id number] - PA Case ID: [id number]  Status is pending

## 2022-06-21 ENCOUNTER — Ambulatory Visit (INDEPENDENT_AMBULATORY_CARE_PROVIDER_SITE_OTHER): Payer: BC Managed Care – PPO | Admitting: Dermatology

## 2022-06-21 VITALS — BP 130/77 | HR 119 | Wt 197.0 lb

## 2022-06-21 DIAGNOSIS — K130 Diseases of lips: Secondary | ICD-10-CM | POA: Diagnosis not present

## 2022-06-21 DIAGNOSIS — L853 Xerosis cutis: Secondary | ICD-10-CM | POA: Diagnosis not present

## 2022-06-21 DIAGNOSIS — Z79899 Other long term (current) drug therapy: Secondary | ICD-10-CM

## 2022-06-21 DIAGNOSIS — L700 Acne vulgaris: Secondary | ICD-10-CM

## 2022-06-21 DIAGNOSIS — G4733 Obstructive sleep apnea (adult) (pediatric): Secondary | ICD-10-CM | POA: Diagnosis not present

## 2022-06-21 MED ORDER — ISOTRETINOIN 30 MG PO CAPS: ORAL_CAPSULE | ORAL | 0 refills | Status: DC

## 2022-06-21 NOTE — Progress Notes (Deleted)
[id number] 

## 2022-06-21 NOTE — Patient Instructions (Signed)
Due to recent changes in healthcare laws, you may see results of your pathology and/or laboratory studies on MyChart before the doctors have had a chance to review them. We understand that in some cases there may be results that are confusing or concerning to you. Please understand that not all results are received at the same time and often the doctors may need to interpret multiple results in order to provide you with the best plan of care or course of treatment. Therefore, we ask that you please give us 2 business days to thoroughly review all your results before contacting the office for clarification. Should we see a critical lab result, you will be contacted sooner.   If You Need Anything After Your Visit  If you have any questions or concerns for your doctor, please call our main line at [phone] and press option 4 to reach your doctor's medical assistant. If no one answers, please leave a voicemail as directed and we will return your call as soon as possible. Messages left after 4 pm will be answered the following business day.   You may also send us a message via MyChart. We typically respond to MyChart messages within 1-2 business days.  For prescription refills, please ask your pharmacy to contact our office. Our fax number is [phone].  If you have an urgent issue when the clinic is closed that cannot wait until the next business day, you can page your doctor at the number below.    Please note that while we do our best to be available for urgent issues outside of office hours, we are not available 24/7.   If you have an urgent issue and are unable to reach us, you may choose to seek medical care at your doctor's office, retail clinic, urgent care center, or emergency room.  If you have a medical emergency, please immediately call 911 or go to the emergency department.  Pager Numbers  - Dr. Kowalski: [phone]  - Dr. Moye: [phone]  - Dr. Stewart:  [phone]  In the event of inclement weather, please call our main line at [phone] for an update on the status of any delays or closures.  Dermatology Medication Tips: Please keep the boxes that topical medications come in in order to help keep track of the instructions about where and how to use these. Pharmacies typically print the medication instructions only on the boxes and not directly on the medication tubes.   If your medication is too expensive, please contact our office at [phone] option 4 or send us a message through MyChart.   We are unable to tell what your co-pay for medications will be in advance as this is different depending on your insurance coverage. However, we may be able to find a substitute medication at lower cost or fill out paperwork to get insurance to cover a needed medication.   If a prior authorization is required to get your medication covered by your insurance company, please allow us 1-2 business days to complete this process.  Drug prices often vary depending on where the prescription is filled and some pharmacies may offer cheaper prices.  The website www.goodrx.com contains coupons for medications through different pharmacies. The prices here do not account for what the cost may be with help from insurance (it may be cheaper with your insurance), but the website can give you the price if you did not use any insurance.  - You can print the associated coupon and take it with   your prescription to the pharmacy.  - You may also stop by our office during regular business hours and pick up a GoodRx coupon card.  - If you need your prescription sent electronically to a different pharmacy, notify our office through [facility] MyChart or by phone at [phone] option 4.     Si Usted Necesita Algo Despus de Su Visita  Tambin puede enviarnos un mensaje a travs de MyChart. Por lo general respondemos a los mensajes de MyChart en el transcurso de 1 a 2  das hbiles.  Para renovar recetas, por favor pida a su farmacia que se ponga en contacto con nuestra oficina. Nuestro nmero de fax es el [phone].  Si tiene un asunto urgente cuando la clnica est cerrada y que no puede esperar hasta el siguiente da hbil, puede llamar/localizar a su doctor(a) al nmero que aparece a continuacin.   Por favor, tenga en cuenta que aunque hacemos todo lo posible para estar disponibles para asuntos urgentes fuera del horario de oficina, no estamos disponibles las 24 horas del da, los 7 das de la semana.   Si tiene un problema urgente y no puede comunicarse con nosotros, puede optar por buscar atencin mdica  en el consultorio de su doctor(a), en una clnica privada, en un centro de atencin urgente o en una sala de emergencias.  Si tiene una emergencia mdica, por favor llame inmediatamente al 911 o vaya a la sala de emergencias.  Nmeros de bper  - Dr. Kowalski: [phone]  - Dra. Moye: [phone]  - Dra. Stewart: [phone]  En caso de inclemencias del tiempo, por favor llame a nuestra lnea principal al [phone] para una actualizacin sobre el estado de cualquier retraso o cierre.  Consejos para la medicacin en dermatologa: Por favor, guarde las cajas en las que vienen los medicamentos de uso tpico para ayudarle a seguir las instrucciones sobre dnde y cmo usarlos. Las farmacias generalmente imprimen las instrucciones del medicamento slo en las cajas y no directamente en los tubos del medicamento.   Si su medicamento es muy caro, por favor, pngase en contacto con nuestra oficina llamando al [phone] y presione la opcin 4 o envenos un mensaje a travs de MyChart.   No podemos decirle cul ser su copago por los medicamentos por adelantado ya que esto es diferente dependiendo de la cobertura de su seguro. Sin embargo, es posible que podamos encontrar un medicamento sustituto a menor costo o llenar un formulario para que el  seguro cubra el medicamento que se considera necesario.   Si se requiere una autorizacin previa para que su compaa de seguros cubra su medicamento, por favor permtanos de 1 a 2 das hbiles para completar este proceso.  Los precios de los medicamentos varan con frecuencia dependiendo del lugar de dnde se surte la receta y alguna farmacias pueden ofrecer precios ms baratos.  El sitio web www.goodrx.com tiene cupones para medicamentos de diferentes farmacias. Los precios aqu no tienen en cuenta lo que podra costar con la ayuda del seguro (puede ser ms barato con su seguro), pero el sitio web puede darle el precio si no utiliz ningn seguro.  - Puede imprimir el cupn correspondiente y llevarlo con su receta a la farmacia.  - Tambin puede pasar por nuestra oficina durante el horario de atencin regular y recoger una tarjeta de cupones de GoodRx.  - Si necesita que su receta se enve electrnicamente a una farmacia diferente, informe a nuestra oficina a travs de MyChart de [facility]   o por telfono llamando al [phone] y presione la opcin 4.  

## 2022-06-21 NOTE — Progress Notes (Signed)
   Isotretinoin Follow-Up Visit   Subjective  Kevin Ramos is a 27 y.o. male who presents for the following: Isotretinoin follow-up - patient c/o xerosis and cheilitis, but no other s/e, mood changes, or depression.  Week # 4 [facility]acy - [facility] [address] # Total mg -  600 mg Total mg/kg - 6.7 mg/kg   Isotretinoin F/U - 06/21/22 1500       Isotretinoin Follow Up   iPledge # [id number]    Date 06/21/22    Weight 197 lb (89.4 kg)    Acne breakouts since last visit? Yes      Dosage   Target Dosage (mg) 13455    Current (To Date) Dosage (mg) 600    To Go Dosage (mg) 12855      Side Effects   Skin Chapped Lips;Dry Skin;Dry Lips    Gastrointestinal WNL    Neurological WNL    Constitutional WNL              Side effects: Dry skin, dry lips  The following portions of the chart were reviewed this encounter and updated as appropriate: medications, allergies, medical history  Review of Systems:  No other skin or systemic complaints except as noted in HPI or Assessment and Plan.  Objective  Well appearing patient in no apparent distress; mood and affect are within normal limits.  An examination of the face, neck, chest, and back was performed and relevant findings are noted below.     Assessment & Plan     ACNE VULGARIS - tolerating Isotretinoin well with no s/e of mood changes/depression  Patient is currently on Isotretinoin requiring FDA mandated monthly evaluations and laboratory monitoring. Condition is currently not to goal (must reach target dose based on weight and also have clear skin for 2 months prior to discontinuation in order to help prevent relapse)  Continue isotretinoin but increase to 30 mg po QD.   Patient confirmed in iPledge and isotretinoin sent to pharmacy.  Xerosis secondary to isotretinoin therapy - Continue emollients as directed - Xyzal (levocetirizine) once a day and fish oil 1 gram daily may also help with  dryness   Cheilitis secondary to isotretinoin therapy - Continue lip balm as directed, Dr. Luvenia Heller Cortibalm recommended   Long term medication management (isotretinoin) - While taking Isotretinoin and for 30 days after you finish the medication, do not share pills, do not donate blood. It is very important that a women who could become pregnant not take this medicine or get a blood transfusion with this medicine in it. Isotretinoin is best absorbed when taken with a fatty meal. Isotretinoin can make you sensitive to the sun. Daily careful sun protection including sunscreen SPF 30+ when outdoors is recommended.  Follow-up in 30 days.  [name], CMA, am acting as scribe for Sarina Ser, MD .  Documentation: I have reviewed the above documentation for accuracy and completeness, and I agree with the above.  Sarina Ser, MD

## 2022-06-22 ENCOUNTER — Encounter: Payer: Self-pay | Admitting: Dermatology

## 2022-06-26 DIAGNOSIS — F3131 Bipolar disorder, current episode depressed, mild: Secondary | ICD-10-CM | POA: Diagnosis not present

## 2022-06-26 DIAGNOSIS — F411 Generalized anxiety disorder: Secondary | ICD-10-CM | POA: Diagnosis not present

## 2022-06-26 DIAGNOSIS — E039 Hypothyroidism, unspecified: Secondary | ICD-10-CM | POA: Diagnosis not present

## 2022-06-26 DIAGNOSIS — F902 Attention-deficit hyperactivity disorder, combined type: Secondary | ICD-10-CM | POA: Diagnosis not present

## 2022-06-26 DIAGNOSIS — F3181 Bipolar II disorder: Secondary | ICD-10-CM | POA: Diagnosis not present

## 2022-07-03 ENCOUNTER — Telehealth: Payer: Self-pay | Admitting: Endocrinology

## 2022-07-03 NOTE — Telephone Encounter (Signed)
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  Need to appeal this since the hematocrit of 50 last is related to already taking Jatenzo but his testosterone level is very low at 162 and there is no alternative.  All other testosterone treatments have been ineffective

## 2022-07-03 NOTE — Telephone Encounter (Signed)
Patient requesting follow up on PA  

## 2022-07-05 NOTE — Telephone Encounter (Signed)
Pt called for follow up

## 2022-07-06 NOTE — Telephone Encounter (Signed)
Pharmacy Patient Advocate Encounter- see the bottom of the note. Appeal requested by physician. PA # PA Case: [id number] Received notification from [facility] (via CoverMYMeds) that the request for prior authorization for Xyosted 75MG /0.5ML auto-injectors has been denied.     Please be advised we currently do not have a Pharmacist to review denials, therefore you will need to process appeals accordingly as needed. Thanks for your support at this time.  Bellow is from another encounter:  Information has been sent to Clinical Pharmacist for review and processing of appeal. Please be advised appeals may take up to 5 business days to be submitted as pharmacist prepares necessary documentation. Thank you!

## 2022-07-06 NOTE — Telephone Encounter (Signed)
See previous encounter for PA request of Barbaraann Cao, for any additional info.

## 2022-07-09 NOTE — Telephone Encounter (Signed)
Need to appeal the recent denial since the hematocrit of 50 last is related to already taking Jatenzo but his testosterone level is very low at 162 with the [facility] and there is no alternative. All other testosterone treatments have been ineffective and he cannot tolerate Natesto

## 2022-07-11 NOTE — Telephone Encounter (Signed)
Appeal requested.

## 2022-07-11 NOTE — Telephone Encounter (Signed)
Appeal letter faxed, with office notes to [phone] to [facility].

## 2022-07-13 ENCOUNTER — Ambulatory Visit
Admission: RE | Admit: 2022-07-13 | Discharge: 2022-07-13 | Disposition: A | Discharge status: Home / Self Care | Payer: BC Managed Care – PPO | Source: Ambulatory Visit | Attending: Physician Assistant | Admitting: Physician Assistant

## 2022-07-13 DIAGNOSIS — R6881 Early satiety: Secondary | ICD-10-CM | POA: Diagnosis not present

## 2022-07-13 DIAGNOSIS — R112 Nausea with vomiting, unspecified: Secondary | ICD-10-CM | POA: Diagnosis not present

## 2022-07-13 MED ORDER — TECHNETIUM TC 99M SULFUR COLLOID
2.2700 | Freq: Once | INTRAVENOUS | Status: AC | PRN
  Administered 2022-07-13: 2.27 via ORAL

## 2022-07-16 DIAGNOSIS — M9901 Segmental and somatic dysfunction of cervical region: Secondary | ICD-10-CM | POA: Diagnosis not present

## 2022-07-16 DIAGNOSIS — R519 Headache, unspecified: Secondary | ICD-10-CM | POA: Diagnosis not present

## 2022-07-16 DIAGNOSIS — F3131 Bipolar disorder, current episode depressed, mild: Secondary | ICD-10-CM | POA: Diagnosis not present

## 2022-07-16 DIAGNOSIS — M62830 Muscle spasm of back: Secondary | ICD-10-CM | POA: Diagnosis not present

## 2022-07-16 DIAGNOSIS — M9903 Segmental and somatic dysfunction of lumbar region: Secondary | ICD-10-CM | POA: Diagnosis not present

## 2022-07-17 DIAGNOSIS — R7989 Other specified abnormal findings of blood chemistry: Secondary | ICD-10-CM | POA: Diagnosis not present

## 2022-07-22 DIAGNOSIS — G4733 Obstructive sleep apnea (adult) (pediatric): Secondary | ICD-10-CM | POA: Diagnosis not present

## 2022-07-26 ENCOUNTER — Ambulatory Visit: Payer: BC Managed Care – PPO | Admitting: Dermatology

## 2022-07-26 VITALS — BP 123/71 | Wt 197.0 lb

## 2022-07-26 DIAGNOSIS — L700 Acne vulgaris: Secondary | ICD-10-CM

## 2022-07-26 DIAGNOSIS — L853 Xerosis cutis: Secondary | ICD-10-CM

## 2022-07-26 DIAGNOSIS — K130 Diseases of lips: Secondary | ICD-10-CM | POA: Diagnosis not present

## 2022-07-26 DIAGNOSIS — Z79899 Other long term (current) drug therapy: Secondary | ICD-10-CM | POA: Diagnosis not present

## 2022-07-26 DIAGNOSIS — Z86018 Personal history of other benign neoplasm: Secondary | ICD-10-CM

## 2022-07-26 MED ORDER — TACROLIMUS 0.1 % EX OINT: TOPICAL_OINTMENT | CUTANEOUS | 1 refills | Status: AC

## 2022-07-26 MED ORDER — ISOTRETINOIN 40 MG PO CAPS: 40.0000 mg | ORAL_CAPSULE | Freq: Every day | ORAL | 0 refills | Status: AC

## 2022-07-26 NOTE — Patient Instructions (Addendum)

## 2022-07-26 NOTE — Progress Notes (Signed)
   Isotretinoin Follow-Up Visit   Subjective  Kevin Ramos is a 27 y.o. male who presents for the following: Isotretinoin follow-up  Week # 8 (3rd course) Pharmacy [facility] [address] Desma Mcgregor #[id number]  Total mg -  1,500 Total mg/kg - 16.8   Isotretinoin F/U - 07/26/22 1400       Isotretinoin Follow Up   iPledge # [id number]    Date 07/26/22    Weight 197 lb (89.4 kg)      Dosage   Target Dosage (mg) 13455    Current (To Date) Dosage (mg) 1500    To Go Dosage (mg) 11955      Side Effects   Skin Chapped Lips;Dry Lips;Dry Skin    Gastrointestinal WNL    Neurological WNL    Constitutional WNL             Side effects: Dry skin, dry lips  The following portions of the chart were reviewed this encounter and updated as appropriate: medications, allergies, medical history  Review of Systems:  No other skin or systemic complaints except as noted in HPI or Assessment and Plan.  Objective  Well appearing patient in no apparent distress; mood and affect are within normal limits.  An examination of the face, neck, chest, and back was performed and relevant findings are noted below.    Assessment & Plan    ACNE VULGARIS Patient is currently on Isotretinoin requiring FDA mandated monthly evaluations and laboratory monitoring. Condition is currently not to goal (must reach target dose based on weight and also have clear skin for 2 months prior to discontinuation in order to help prevent relapse)  Exam findings: Crust and paps chest and neck  Continue isotretinoin increase to Isotretinoin 40mg  1 po qd with fatty meal  Patient confirmed in iPledge and isotretinoin sent to pharmacy.   Xerosis secondary to isotretinoin therapy - Continue emollients as directed - Xyzal (levocetirizine) once a day and fish oil 1 gram daily may also help with dryness  Cheilitis secondary to isotretinoin therapy - Continue lip balm as directed, Dr. Clayborne Artist Cortibalm recommended -  Start Tacrolimus 0.1% oint qd/bid aa lips prn  Long term medication management (isotretinoin) - While taking Isotretinoin and for 30 days after you finish the medication, do not share pills, do not donate blood. It is very important that a women who could become pregnant not take this medicine or get a blood transfusion with this medicine in it. Isotretinoin is best absorbed when taken with a fatty meal. Isotretinoin can make you sensitive to the sun. Daily careful sun protection including sunscreen SPF 30+ when outdoors is recommended.  HISTORY OF DYSPLASTIC NEVUS No evidence of recurrence today Recommend regular full body skin exams Recommend daily broad spectrum sunscreen SPF 30+ to sun-exposed areas, reapply every 2 hours as needed.  Call if any new or changing lesions are noted between office visits  - L inf side above waistline - clear  Follow-up in 30 days.  I, Ardis Rowan, RMA, am acting as scribe for Armida Sans, MD .  Documentation: I have reviewed the above documentation for accuracy and completeness, and I agree with the above.  Armida Sans, MD

## 2022-08-01 ENCOUNTER — Encounter: Payer: Self-pay | Admitting: Dermatology

## 2022-08-02 ENCOUNTER — Telehealth: Payer: Self-pay

## 2022-08-02 ENCOUNTER — Other Ambulatory Visit (HOSPITAL_COMMUNITY): Payer: Self-pay

## 2022-08-02 ENCOUNTER — Other Ambulatory Visit: Payer: Self-pay | Admitting: Endocrinology

## 2022-08-02 MED ORDER — XYOSTED 75 MG/0.5ML ~~LOC~~ SOAJ: SUBCUTANEOUS | 3 refills | Status: DC

## 2022-08-02 NOTE — Telephone Encounter (Signed)
Pharmacy Patient Advocate Encounter    PA re-submitted on 08/02/22 to (ins) [employer] (same ins as bf) via CoverMyMeds Key or (Medicaid) confirmation # [id number]    PA # PA Case ID: [id number] Effective dates: 08/02/22 through 08/02/23  Please send in RX to pt's pharmacy. (If I did the test claim correctly (injections once a week) 1 month supply is $50.

## 2022-08-02 NOTE — Telephone Encounter (Signed)
Patient called to see if medication had been approved. Testosterone Injection  XYOSTED (testosterone enanthate) was approved and copy of approval in media. [facility] [place]

## 2022-08-02 NOTE — Telephone Encounter (Signed)
Prescription sent, patient informed via MyChart

## 2022-08-02 NOTE — Telephone Encounter (Signed)
Called to follow up on appeal, since we haven't received any information. They stated they hadn't received any additional information to request an appeal or in regard to additional information for this request. Will start a new request.

## 2022-08-03 ENCOUNTER — Other Ambulatory Visit: Payer: Self-pay

## 2022-08-03 ENCOUNTER — Other Ambulatory Visit: Payer: Self-pay | Admitting: Internal Medicine

## 2022-08-03 MED ORDER — XYOSTED 75 MG/0.5ML ~~LOC~~ SOAJ: SUBCUTANEOUS | 3 refills | Status: DC

## 2022-08-03 NOTE — Progress Notes (Signed)
Not available at the pharmacy it was sent to but [facility]. They cannot transfer since it Is a new rx.

## 2022-08-03 NOTE — Telephone Encounter (Signed)
Pharmacy that this was sent to did not have in stock. He needs it resent to [facility] [place]. I tried to resend and it printed. He is out and needs today. Kumar sent yesterday but is out of the office today. They cannot transfer it to the other pharmacy.

## 2022-08-06 ENCOUNTER — Other Ambulatory Visit (HOSPITAL_COMMUNITY): Payer: Self-pay

## 2022-08-06 DIAGNOSIS — F3131 Bipolar disorder, current episode depressed, mild: Secondary | ICD-10-CM | POA: Diagnosis not present

## 2022-08-07 ENCOUNTER — Encounter: Payer: Self-pay | Admitting: Endocrinology

## 2022-08-16 ENCOUNTER — Ambulatory Visit: Payer: BC Managed Care – PPO | Admitting: Podiatry

## 2022-08-16 ENCOUNTER — Encounter: Payer: Self-pay | Admitting: Podiatry

## 2022-08-16 VITALS — BP 131/79 | HR 96

## 2022-08-16 DIAGNOSIS — M9903 Segmental and somatic dysfunction of lumbar region: Secondary | ICD-10-CM | POA: Diagnosis not present

## 2022-08-16 DIAGNOSIS — R519 Headache, unspecified: Secondary | ICD-10-CM | POA: Diagnosis not present

## 2022-08-16 DIAGNOSIS — M62462 Contracture of muscle, left lower leg: Secondary | ICD-10-CM | POA: Diagnosis not present

## 2022-08-16 DIAGNOSIS — M722 Plantar fascial fibromatosis: Secondary | ICD-10-CM

## 2022-08-16 DIAGNOSIS — M62461 Contracture of muscle, right lower leg: Secondary | ICD-10-CM | POA: Diagnosis not present

## 2022-08-16 DIAGNOSIS — M9901 Segmental and somatic dysfunction of cervical region: Secondary | ICD-10-CM | POA: Diagnosis not present

## 2022-08-16 DIAGNOSIS — M62830 Muscle spasm of back: Secondary | ICD-10-CM | POA: Diagnosis not present

## 2022-08-16 NOTE — Progress Notes (Signed)
Subjective:  Patient ID: Kevin Ramos, male    DOB: 12-03-1995,  MRN: [id number]  Chief Complaint  Patient presents with   Plantar Fasciitis    "It feels like my Plantar Fasciitis has come back in both of my heels."    27 y.o. male presents with the above complaint.  Patient presents with bilateral Planter fasciitis.  Right side is greater than left side.  He states it started to act back up again denies any other acute complaints here for an injection  Review of Systems: Negative except as noted in the HPI. Denies N/V/F/Ch.  Past Medical History:  Diagnosis Date   Acne    Depression    Dysplastic nevus 02/09/2019   right upper back, moderate atypia, close to margin.   Dysplastic nevus 05/24/2022   Left inferior side above waistline. Moderate atypia, closed to but does not involve margin.    Current Outpatient Medications:    acetaminophen (TYLENOL) 500 MG tablet, Take 500 mg by mouth every 6 (six) hours as needed., Disp: , Rfl:    acetaminophen-codeine (TYLENOL #3) 300-30 MG tablet, Take 1-2 tablets by mouth every 4 (four) hours as needed for moderate pain., Disp: 30 tablet, Rfl: 0   amphetamine-dextroamphetamine (ADDERALL) 5 MG tablet, Take 1 tablet (5 mg total) by mouth daily., Disp: 30 tablet, Rfl: 0   asenapine (SAPHRIS) 5 MG SUBL 24 hr tablet, Place under the tongue., Disp: , Rfl:    azelastine (ASTELIN) 0.1 % nasal spray, Place into the nose., Disp: , Rfl:    calcitRIOL (ROCALTROL) 0.5 MCG capsule, TAKE 1 CAPSULE BY MOUTH DAILY, Disp: 90 capsule, Rfl: 1   calcium carbonate (OSCAL) 1500 (600 Ca) MG TABS tablet, Take by mouth., Disp: , Rfl:    Calcium Carbonate-Vit D-Min (CALCIUM 1200 PO), Take 2 capsules by mouth daily. , Disp: , Rfl:    cetirizine (ZYRTEC) 10 MG tablet, Take by mouth., Disp: , Rfl:    Cholecalciferol (VITAMIN D) 50 MCG (2000 UT) tablet, Take 2,000 Units by mouth daily., Disp: , Rfl:    cyanocobalamin 1000 MCG tablet, Take by mouth., Disp: , Rfl:     DESCOVY 200-25 MG tablet, Take 1 tablet by mouth daily., Disp: , Rfl:    dexamethasone (DECADRON) 1 MG tablet, Take by mouth., Disp: , Rfl:    DULoxetine (CYMBALTA) 30 MG capsule, Take 2 capsules in the morning and 1 capsule in the evening, Disp: 90 capsule, Rfl: 1   emtricitabine-tenofovir (TRUVADA) 200-300 MG tablet, Take 1 tablet by mouth daily., Disp: , Rfl:    ferrous sulfate 325 (65 FE) MG EC tablet, Take 1 tablet by mouth every other day., Disp: , Rfl:    fluticasone (FLONASE) 50 MCG/ACT nasal spray, Place into the nose., Disp: , Rfl:    hydrocortisone 2.5 % ointment, Apply to corner of mouth twice daily for up to 1 week., Disp: 30 g, Rfl: 0   ISOtretinoin (ACCUTANE) 40 MG capsule, Take 1 capsule (40 mg total) by mouth daily. Take with fatty meal, Disp: 30 capsule, Rfl: 0   ketoconazole (NIZORAL) 2 % cream, Apply to groin QD., Disp: 60 g, Rfl: 0   ketoconazole (NIZORAL) 2 % shampoo, Apply 1 Application topically as directed. Shampoo scalp 3 times per week, Disp: 120 mL, Rfl: 11   lidocaine (LIDODERM) 5 %, 1 patch daily., Disp: , Rfl:    mupirocin ointment (BACTROBAN) 2 %, Apply to skin qd-bid, Disp: 22 g, Rfl: 0   nabumetone (RELAFEN) 500 MG tablet,  Take 500 mg by mouth 2 (two) times daily., Disp: , Rfl:    nabumetone (RELAFEN) 500 MG tablet, Take 1 tablet by mouth 2 (two) times daily., Disp: , Rfl:    nystatin (MYCOSTATIN) 100000 UNIT/ML suspension, Take by mouth., Disp: , Rfl:    potassium chloride (KLOR-CON) 8 MEQ tablet, TAKE 1 TABLET (8 MEQ TOTAL) BY MOUTH 3 (THREE) TIMES DAILY., Disp: 270 tablet, Rfl: 1   pregabalin (LYRICA) 75 MG capsule, Start with 75mg  capsule at bedtime and if doing well, may increase to twice a day after 5 days, Disp: , Rfl:    pregabalin (LYRICA) 75 MG capsule, Take 1 capsule by mouth 2 (two) times daily., Disp: , Rfl:    tacrolimus (PROTOPIC) 0.1 % ointment, Apply topically as directed. Qd to bid to aa lips prn flares, Disp: 60 g, Rfl: 1   Testosterone  Enanthate (XYOSTED) 75 MG/0.5ML SOAJ, Inject 75 mg weekly into fatty part of stomach, Disp: 1.96 mL, Rfl: 3   triamcinolone ointment (KENALOG) 0.1 %, Apply 1 application topically 2 (two) times daily as needed (Rash). Use for up to 2 weeks on affected areas of arms  Avoid applying to face, groin, and axilla. Use as directed., Disp: 60 g, Rfl: 1   Vitamin D, Ergocalciferol, (DRISDOL) 1.25 MG (50000 UNIT) CAPS capsule, Take 50,000 Units by mouth once a week., Disp: , Rfl:    levothyroxine (SYNTHROID) 75 MCG tablet, TAKE ONE TABLET BY MOUTH EVERY MORNING BEFORE BREAKFAST (Patient not taking: Reported on 08/16/2022), Disp: 90 tablet, Rfl: 0   meloxicam (MOBIC) 15 MG tablet, TAKE 1 TABLET BY MOUTH EVERY DAY (Patient not taking: Reported on 08/16/2022), Disp: 30 tablet, Rfl: 0  Social History   Tobacco Use  Smoking Status Never  Smokeless Tobacco Never    Allergies  Allergen Reactions   Abilify [Aripiprazole] Nausea Only   Effexor [Venlafaxine] Nausea Only   Objective:   Vitals:   08/16/22 1025  BP: 131/79  Pulse: 96   There is no height or weight on file to calculate BMI. Constitutional Well developed. Well nourished.  Vascular Dorsalis pedis pulses palpable bilaterally. Posterior tibial pulses palpable bilaterally. Capillary refill normal to all digits.  No cyanosis or clubbing noted. Pedal hair growth normal.  Neurologic Normal speech. Oriented to person, place, and time. Epicritic sensation to light touch grossly present bilaterally.  Dermatologic Nails well groomed and normal in appearance. No open wounds. No skin lesions.  Orthopedic: Normal joint ROM without pain or crepitus bilaterally. No visible deformities. Tender to palpation at the calcaneal tuber bilateral No pain with calcaneal squeeze right Ankle ROM diminished range of motion bilateral Silfverskiold Test: positive right   Radiographs: Taken and reviewed. No acute fractures or dislocations. No evidence of stress  fracture.  Plantar heel spur present. Posterior heel spur present.   Assessment:   1. Plantar fasciitis, right   2. Plantar fasciitis of left foot   3. Gastrocnemius equinus, right   4. Gastrocnemius equinus, left     Plan:  Patient was evaluated and treated and all questions answered.  Plantar Fasciitis, bilateral right greater than left side - XR reviewed as above.  - Educated on icing and stretching. Instructions given.  - Injection delivered to the plantar fascia as below. - DME: Continue plantar Fascial Brace - Pharmacologic management: None  Procedure: Injection Tendon/Ligament Location: Bilateral plantar fascia at the glabrous junction; medial approach. Skin Prep: alcohol Injectate: 0.5 cc 0.5% marcaine plain, 0.5 cc of 1% Lidocaine, 0.5 cc  kenalog 10. Disposition: Patient tolerated procedure well. Injection site dressed with a band-aid.  No follow-ups on file.

## 2022-08-21 DIAGNOSIS — G4733 Obstructive sleep apnea (adult) (pediatric): Secondary | ICD-10-CM | POA: Diagnosis not present

## 2022-08-27 ENCOUNTER — Encounter: Payer: Self-pay | Admitting: Dermatology

## 2022-08-27 ENCOUNTER — Ambulatory Visit: Payer: BC Managed Care – PPO | Admitting: Dermatology

## 2022-08-27 VITALS — BP 124/75 | HR 88 | Wt 197.0 lb

## 2022-08-27 DIAGNOSIS — Z79899 Other long term (current) drug therapy: Secondary | ICD-10-CM | POA: Diagnosis not present

## 2022-08-27 DIAGNOSIS — K130 Diseases of lips: Secondary | ICD-10-CM

## 2022-08-27 DIAGNOSIS — L853 Xerosis cutis: Secondary | ICD-10-CM | POA: Diagnosis not present

## 2022-08-27 DIAGNOSIS — L700 Acne vulgaris: Secondary | ICD-10-CM | POA: Diagnosis not present

## 2022-08-27 DIAGNOSIS — F3131 Bipolar disorder, current episode depressed, mild: Secondary | ICD-10-CM | POA: Diagnosis not present

## 2022-08-27 DIAGNOSIS — E876 Hypokalemia: Secondary | ICD-10-CM | POA: Diagnosis not present

## 2022-08-27 MED ORDER — ALCLOMETASONE DIPROPIONATE 0.05 % EX OINT: TOPICAL_OINTMENT | CUTANEOUS | 1 refills | Status: AC

## 2022-08-27 MED ORDER — ABSORICA LD 24 MG PO CAPS
2.0000 | ORAL_CAPSULE | Freq: Every day | ORAL | 0 refills | Status: DC
Start: 2022-08-27 — End: 2022-10-02

## 2022-08-27 NOTE — Patient Instructions (Addendum)
Your prescription was sent to [facility] in [place]. A representative from [facility] will contact you within 3 business hours to verify your address and insurance information to schedule a free delivery. If for any reason you do not receive a phone call from them, please reach out to them. Their phone number is [phone] and their hours are Monday-Friday 9:00 am-5:00 pm.   Recommend taking Heliocare sun protection supplement daily in sunny weather for additional sun protection. For maximum protection on the sunniest days, you can take up to 2 capsules of regular Heliocare OR take 1 capsule of Heliocare Ultra. For prolonged exposure (such as a full day in the sun), you can repeat your dose of the supplement 4 hours after your first dose. Heliocare can be purchased at [employer], at some [facility] or at [email or web address].     Recommend daily broad spectrum sunscreen SPF 30+ to sun-exposed areas, reapply every 2 hours as needed. Call for new or changing lesions.  Staying in the shade or wearing long sleeves, sun glasses (UVA+UVB protection) and wide brim hats (4-inch brim around the entire circumference of the hat) are also recommended for sun protection.    While taking isotretinoin, do not share pills and do not donate blood. Generic isotretinoin is best absorbed when taken with a fatty meal. Isotretinoin can make you sensitive to the sun. Daily careful sun protection including sunscreen SPF 30+ when outdoors is recommended.  Due to recent changes in healthcare laws, you may see results of your pathology and/or laboratory studies on MyChart before the doctors have had a chance to review them. We understand that in some cases there may be results that are confusing or concerning to you. Please understand that not all results are received at the same time and often the doctors may need to interpret multiple results in order to provide you with the best plan of care or course of  treatment. Therefore, we ask that you please give [place] 2 business days to thoroughly review all your results before contacting the office for clarification. Should we see a critical lab result, you will be contacted sooner.   If You Need Anything After Your Visit  If you have any questions or concerns for your doctor, please call our main line at [phone] and press option 4 to reach your doctor's medical assistant. If no one answers, please leave a voicemail as directed and we will return your call as soon as possible. Messages left after 4 pm will be answered the following business day.   You may also send [place] a message via MyChart. We typically respond to MyChart messages within 1-2 business days.  For prescription refills, please ask your pharmacy to contact our office. Our fax number is [phone].  If you have an urgent issue when the clinic is closed that cannot wait until the next business day, you can page your doctor at the number below.    Please note that while we do our best to be available for urgent issues outside of office hours, we are not available 24/7.   If you have an urgent issue and are unable to reach [place], you may choose to seek medical care at your doctor's office, retail clinic, urgent care center, or emergency room.  If you have a medical emergency, please immediately call 911 or go to the emergency department.  Pager Numbers  - Dr. Gwen Pounds: [phone]  - Dr. Neale Burly: [phone]  - Dr. Roseanne Reno: [phone]  In the  event of inclement weather, please call our main line at [phone] for an update on the status of any delays or closures.  Dermatology Medication Tips: Please keep the boxes that topical medications come in in order to help keep track of the instructions about where and how to use these. Pharmacies typically print the medication instructions only on the boxes and not directly on the medication tubes.   If your medication is too expensive,  please contact our office at [phone] option 4 or send [place] a message through MyChart.   We are unable to tell what your co-pay for medications will be in advance as this is different depending on your insurance coverage. However, we may be able to find a substitute medication at lower cost or fill out paperwork to get insurance to cover a needed medication.   If a prior authorization is required to get your medication covered by your insurance company, please allow [place] 1-2 business days to complete this process.  Drug prices often vary depending on where the prescription is filled and some pharmacies may offer cheaper prices.  The website www.goodrx.com contains coupons for medications through different pharmacies. The prices here do not account for what the cost may be with help from insurance (it may be cheaper with your insurance), but the website can give you the price if you did not use any insurance.  - You can print the associated coupon and take it with your prescription to the pharmacy.  - You may also stop by our office during regular business hours and pick up a GoodRx coupon card.  - If you need your prescription sent electronically to a different pharmacy, notify our office through [facility] or by phone at [phone] option 4.     Si Usted Necesita Algo Despus de Su Visita  Tambin puede enviarnos un mensaje a travs de Clinical cytogeneticist. Por lo general respondemos a los mensajes de MyChart en el transcurso de 1 a 2 das hbiles.  Para renovar recetas, por favor pida a su farmacia que se ponga en contacto con nuestra oficina. Annie Sable de fax es [place] [phone].  Si tiene un asunto urgente cuando la clnica est cerrada y que no puede esperar hasta el siguiente da hbil, puede llamar/localizar a su doctor(a) al nmero que aparece a continuacin.   Por favor, tenga en cuenta que aunque hacemos todo lo posible para estar disponibles para asuntos urgentes fuera del  horario de [place], no estamos disponibles las 24 horas del da, los 7 [address]  [address] de la [place].   Si tiene un problema urgente y no puede comunicarse con nosotros, puede optar por buscar atencin mdica  en el consultorio de su doctor(a), en una clnica privada, en un centro de atencin urgente o en una sala de emergencias.  Si tiene Engineer, drilling, por favor llame inmediatamente al 911 o vaya a la sala de emergencias.  Nmeros de bper  - Dr. Gwen Pounds: [phone]  - Dra. Moye: [phone]  - Dra. Roseanne Reno: [phone]  En caso de inclemencias del [place], por favor llame a Lacy Duverney principal al [phone] para una actualizacin sobre el [place] de cualquier retraso o cierre.  Consejos para la medicacin en dermatologa: Por favor, guarde las cajas en las que vienen los medicamentos de uso tpico para ayudarle a seguir las instrucciones sobre dnde y cmo usarlos. Las farmacias generalmente imprimen las instrucciones del medicamento slo en las cajas y no directamente en los tubos del [facility].   Si su  medicamento es [place] caro, por favor, pngase en contacto con Rolm Gala llamando al [phone] y presione la opcin 4 o envenos un mensaje a travs de Clinical cytogeneticist.   No podemos decirle cul ser su copago por los medicamentos por adelantado ya que esto es diferente dependiendo de la cobertura de su seguro. Sin embargo, es posible que podamos encontrar un medicamento sustituto a Audiological scientist un formulario para que el seguro cubra el medicamento que se considera necesario.   Si se requiere una autorizacin previa para que su compaa de seguros [place] su medicamento, por favor permtanos de 1 a 2 das hbiles para completar [address]  Los precios de los medicamentos varan con frecuencia dependiendo del Environmental consultant de dnde se surte la receta y alguna farmacias pueden ofrecer precios ms baratos.  El sitio web www.goodrx.com tiene cupones para medicamentos de Engineer, civil (consulting). Los precios aqu no tienen en cuenta lo que podra costar con la ayuda del seguro (puede ser ms barato con su seguro), pero el sitio web puede darle el precio si no utiliz Tourist information centre manager.  - Puede imprimir el cupn correspondiente y llevarlo con su receta a la farmacia.  - Tambin puede pasar por nuestra oficina durante el horario de atencin regular y Education officer, museum una tarjeta de cupones de GoodRx.  - Si necesita que su receta se enve electrnicamente a una farmacia diferente, informe a nuestra oficina a travs de MyChart de [place] o por telfono llamando al [phone] y presione la opcin 4.

## 2022-08-27 NOTE — Progress Notes (Signed)
Isotretinoin Follow-Up Visit   Subjective  Kevin Ramos is a 27 y.o. male who presents for the following: Isotretinoin follow-up No new breakouts , spots at chest and back of neck wont go away  Week # 12 (3rd course) Pharmacy [facility]  iPLEDGE # [id number] Total mg -  2700 Total mg/kg - 30.20   Isotretinoin F/U - 08/27/22 1400       Isotretinoin Follow Up   iPledge # [id number]    Date 08/27/22    Weight 197 lb (89.4 kg)      Dosage   Target Dosage (mg) 13455    Current (To Date) Dosage (mg) 2700    To Go Dosage (mg) 10755      Side Effects   Skin Chapped Lips;Dry Lips;Dry Skin    Gastrointestinal WNL    Neurological WNL    Constitutional WNL              Side effects: Dry skin, dry lips  The following portions of the chart were reviewed this encounter and updated as appropriate: medications, allergies, medical history  Review of Systems:  No other skin or systemic complaints except as noted in HPI or Assessment and Plan.  Objective  Well appearing patient in no apparent distress; mood and affect are within normal limits.  An examination of the face, neck, chest, and back was performed and relevant findings are noted below.     Assessment & Plan   Acne vulgaris  Related Medications ISOtretinoin Micronized (ABSORICA LD) 24 MG CAPS Take 2 capsules by mouth daily.  Cheilitis  Related Medications alclomethasone (ACLOVATE) 0.05 % ointment Apply topically to lips qd/bid prn for dryness    ACNE VULGARIS Patient is currently on Isotretinoin requiring FDA mandated monthly evaluations and laboratory monitoring. Condition is currently not to goal (must reach target dose based on weight and also have clear skin for 2 months prior to discontinuation in order to help prevent relapse)  Exam findings: Scarring on cheeks, acne cyst on right sideburn, scarring on shoulders and upper back, resolving cystic papules on  posterior neck, spinal  back and chest   Treatment: Increase Isotretinoin 40mg  to Absorica LD 24 take 2 capsules by mouth daily.  Recommend changing to LD version since this is patient's third isotretinoin course and he is unable to eat enough fat daily to promote best absorption/efficacy.  Patient advised to complete all current medication before starting new rx.  Cysts that don't resolve with isotretinoin may need excision in future.  Checked LFT's from 08/27/2022- wnl  Will send rx to [facility]  Will plan to recheck lipid panel at next follow up  Patient confirmed in iPledge and isotretinoin sent to pharmacy.   Xerosis secondary to isotretinoin therapy - Continue emollients as directed - Xyzal (levocetirizine) once a day and fish oil 1 gram daily may also help with dryness   Cheilitis secondary to isotretinoin therapy - Continue lip balm as directed, Dr. Clayborne Artist Cortibalm recommended - pt unable to get Tacrolimus ointment.  Start Aclovate 0.05 % ointment- apply qd/bid prn for chapped lips  Long term medication management (isotretinoin) - While taking Isotretinoin and for 30 days after you finish the medication, do not share pills, do not donate blood. It is very important that a women who could become pregnant not take this medicine or get a blood transfusion with this medicine in it. Isotretinoin is best absorbed when taken with a fatty meal. Isotretinoin can make you sensitive to the  sun. Daily careful sun protection including sunscreen SPF 30+ when outdoors is recommended.  Follow-up in 30 days.  I, Asher Muir, CMA, am acting as scribe for Willeen Niece, MD.   Documentation: I have reviewed the above documentation for accuracy and completeness, and I agree with the above.  Willeen Niece, MD

## 2022-08-29 DIAGNOSIS — E039 Hypothyroidism, unspecified: Secondary | ICD-10-CM | POA: Diagnosis not present

## 2022-08-29 DIAGNOSIS — G5793 Unspecified mononeuropathy of bilateral lower limbs: Secondary | ICD-10-CM | POA: Diagnosis not present

## 2022-08-29 DIAGNOSIS — E559 Vitamin D deficiency, unspecified: Secondary | ICD-10-CM | POA: Diagnosis not present

## 2022-08-29 DIAGNOSIS — E876 Hypokalemia: Secondary | ICD-10-CM | POA: Diagnosis not present

## 2022-08-30 DIAGNOSIS — G4733 Obstructive sleep apnea (adult) (pediatric): Secondary | ICD-10-CM | POA: Diagnosis not present

## 2022-09-06 ENCOUNTER — Encounter: Payer: Self-pay | Admitting: Endocrinology

## 2022-09-13 ENCOUNTER — Other Ambulatory Visit: Payer: Self-pay | Admitting: Lab

## 2022-09-13 DIAGNOSIS — E876 Hypokalemia: Secondary | ICD-10-CM

## 2022-09-13 DIAGNOSIS — E038 Other specified hypothyroidism: Secondary | ICD-10-CM

## 2022-09-13 DIAGNOSIS — E230 Hypopituitarism: Secondary | ICD-10-CM

## 2022-09-14 ENCOUNTER — Ambulatory Visit: Payer: BC Managed Care – PPO | Admitting: Endocrinology

## 2022-09-18 DIAGNOSIS — F3181 Bipolar II disorder: Secondary | ICD-10-CM | POA: Diagnosis not present

## 2022-09-18 DIAGNOSIS — M9903 Segmental and somatic dysfunction of lumbar region: Secondary | ICD-10-CM | POA: Diagnosis not present

## 2022-09-18 DIAGNOSIS — M62830 Muscle spasm of back: Secondary | ICD-10-CM | POA: Diagnosis not present

## 2022-09-18 DIAGNOSIS — F411 Generalized anxiety disorder: Secondary | ICD-10-CM | POA: Diagnosis not present

## 2022-09-18 DIAGNOSIS — F902 Attention-deficit hyperactivity disorder, combined type: Secondary | ICD-10-CM | POA: Diagnosis not present

## 2022-09-18 DIAGNOSIS — E038 Other specified hypothyroidism: Secondary | ICD-10-CM | POA: Diagnosis not present

## 2022-09-18 DIAGNOSIS — M9901 Segmental and somatic dysfunction of cervical region: Secondary | ICD-10-CM | POA: Diagnosis not present

## 2022-09-18 DIAGNOSIS — R519 Headache, unspecified: Secondary | ICD-10-CM | POA: Diagnosis not present

## 2022-09-18 DIAGNOSIS — E876 Hypokalemia: Secondary | ICD-10-CM | POA: Diagnosis not present

## 2022-09-18 DIAGNOSIS — E230 Hypopituitarism: Secondary | ICD-10-CM | POA: Diagnosis not present

## 2022-09-19 LAB — BASIC METABOLIC PANEL
BUN: 8 mg/dL (ref 6–20)
Creatinine, Ser: 0.9 mg/dL (ref 0.76–1.27)

## 2022-09-19 LAB — CBC
MCV: 78 fL — ABNORMAL LOW (ref 79–97)
RDW: 20.7 % — ABNORMAL HIGH (ref 11.6–15.4)
WBC: 8.1 10*3/uL (ref 3.4–10.8)

## 2022-09-20 LAB — BASIC METABOLIC PANEL
BUN/Creatinine Ratio: 9 (ref 9–20)
CO2: 26 mmol/L (ref 20–29)
Calcium: 8.1 mg/dL — ABNORMAL LOW (ref 8.7–10.2)
Chloride: 96 mmol/L (ref 96–106)
Glucose: 88 mg/dL (ref 70–99)
Potassium: 3.5 mmol/L (ref 3.5–5.2)
Sodium: 139 mmol/L (ref 134–144)
eGFR: 119 mL/min/{1.73_m2} (ref >59)

## 2022-09-20 LAB — CBC
Hematocrit: 51.2 % — ABNORMAL HIGH (ref 37.5–51.0)
Hemoglobin: 16.4 g/dL (ref 13.0–17.7)
MCH: 24.8 pg — ABNORMAL LOW (ref 26.6–33.0)
MCHC: 32 g/dL (ref 31.5–35.7)
Platelets: 233 10*3/uL (ref 150–450)
RBC: 6.61 x10E6/uL — ABNORMAL HIGH (ref 4.14–5.80)

## 2022-09-20 LAB — TESTOSTERONE, TOTAL, LC/MS/MS: Testosterone, total: 403.1 ng/dL (ref 264.0–916.0)

## 2022-09-20 LAB — T4, FREE: Free T4: 1.09 ng/dL (ref 0.82–1.77)

## 2022-09-25 ENCOUNTER — Other Ambulatory Visit: Payer: Self-pay | Admitting: Endocrinology

## 2022-09-29 DIAGNOSIS — G4733 Obstructive sleep apnea (adult) (pediatric): Secondary | ICD-10-CM | POA: Diagnosis not present

## 2022-10-01 ENCOUNTER — Ambulatory Visit: Payer: BC Managed Care – PPO | Admitting: Dermatology

## 2022-10-01 VITALS — BP 112/85 | Wt 197.0 lb

## 2022-10-01 DIAGNOSIS — L700 Acne vulgaris: Secondary | ICD-10-CM | POA: Diagnosis not present

## 2022-10-01 DIAGNOSIS — K130 Diseases of lips: Secondary | ICD-10-CM

## 2022-10-01 DIAGNOSIS — L853 Xerosis cutis: Secondary | ICD-10-CM

## 2022-10-01 DIAGNOSIS — Z79899 Other long term (current) drug therapy: Secondary | ICD-10-CM | POA: Diagnosis not present

## 2022-10-01 NOTE — Progress Notes (Signed)
   Isotretinoin Follow-Up Visit   Subjective  Kevin Ramos is a 27 y.o. male who presents for the following: Isotretinoin follow-up  Week # 63 (3rd course) Pharmacy [facility] iPLEDGE # [id number]  Total mg -  4,500 Total mg/kg - 50.3mg /kg   Isotretinoin F/U - 10/01/22 0800       Isotretinoin Follow Up   iPledge # [id number]    Date 10/01/22    Weight 197 lb (89.4 kg)    Acne breakouts since last visit? No      Dosage   Target Dosage (mg) 13455    Current (To Date) Dosage (mg) 4500    To Go Dosage (mg) 8955      Side Effects   Skin Chapped Lips;Dry Eyes;Dry Lips    Gastrointestinal WNL    Neurological WNL    Constitutional WNL              Side effects: Dry skin, dry lips  The following portions of the chart were reviewed this encounter and updated as appropriate: medications, allergies, medical history  Review of Systems:  No other skin or systemic complaints except as noted in HPI or Assessment and Plan.  Objective  Well appearing patient in no apparent distress; mood and affect are within normal limits.  An examination of the face, neck, chest, and back was performed and relevant findings are noted below.     Assessment & Plan   Acne vulgaris  Related Procedures Lipid panel Hepatic Function Panel  Related Medications ISOtretinoin Micronized (ABSORICA LD) 24 MG CAPS Take 2 capsules by mouth daily.    ACNE VULGARIS Patient is currently on Isotretinoin requiring FDA mandated monthly evaluations and laboratory monitoring. Condition is currently not to goal (must reach target dose based on weight and also have clear skin for 2 months prior to discontinuation in order to help prevent relapse)  Exam findings: resolving inflammatory paps forehead, glabella, cyst R sideburn, cystic nodules chest, resolving cystic nodules R shoulder, post lower neck, scarring back, chest, shoulders  Week # 16 (3rd course) Pharmacy [facility]acy iPLEDGE # [id number]  Total mg -  4,500 Total mg/kg - 50.3mg /kg  Check lipid panel, hepatic panel Pending labs increase isotretinoin to Absorica LD 32mg  2 po qd    Xerosis secondary to isotretinoin therapy - Continue emollients as directed - Xyzal (levocetirizine) once a day and fish oil 1 gram daily may also help with dryness   Cheilitis secondary to isotretinoin therapy - Continue lip balm as directed, Dr. Clayborne Artist Cortibalm recommended.  Continue alclometasone ointment every day/bid prn flares   Long term medication management (isotretinoin) - While taking Isotretinoin and for 30 days after you finish the medication, do not share pills, do not donate blood. It is very important that a women who could become pregnant not take this medicine or get a blood transfusion with this medicine in it. Isotretinoin is best absorbed when taken with a fatty meal. Isotretinoin can make you sensitive to the sun. Daily careful sun protection including sunscreen SPF 30+ when outdoors is recommended.  Follow-up in 30 days.  I, Ardis Rowan, RMA, am acting as scribe for Willeen Niece, MD .   Documentation: I have reviewed the above documentation for accuracy and completeness, and I agree with the above.  Willeen Niece, MD

## 2022-10-01 NOTE — Patient Instructions (Signed)
Due to recent changes in healthcare laws, you may see results of your pathology and/or laboratory studies on MyChart before the doctors have had a chance to review them. We understand that in some cases there may be results that are confusing or concerning to you. Please understand that not all results are received at the same time and often the doctors may need to interpret multiple results in order to provide you with the best plan of care or course of treatment. Therefore, we ask that you please give us 2 business days to thoroughly review all your results before contacting the office for clarification. Should we see a critical lab result, you will be contacted sooner.   If You Need Anything After Your Visit  If you have any questions or concerns for your doctor, please call our main line at [phone] and press option 4 to reach your doctor's medical assistant. If no one answers, please leave a voicemail as directed and we will return your call as soon as possible. Messages left after 4 pm will be answered the following business day.   You may also send us a message via MyChart. We typically respond to MyChart messages within 1-2 business days.  For prescription refills, please ask your pharmacy to contact our office. Our fax number is [phone].  If you have an urgent issue when the clinic is closed that cannot wait until the next business day, you can page your doctor at the number below.    Please note that while we do our best to be available for urgent issues outside of office hours, we are not available 24/7.   If you have an urgent issue and are unable to reach us, you may choose to seek medical care at your doctor's office, retail clinic, urgent care center, or emergency room.  If you have a medical emergency, please immediately call 911 or go to the emergency department.  Pager Numbers  - Dr. Kowalski: [phone]  - Dr. Moye: [phone]  - Dr. Stewart:  [phone]  In the event of inclement weather, please call our main line at [phone] for an update on the status of any delays or closures.  Dermatology Medication Tips: Please keep the boxes that topical medications come in in order to help keep track of the instructions about where and how to use these. Pharmacies typically print the medication instructions only on the boxes and not directly on the medication tubes.   If your medication is too expensive, please contact our office at [phone] option 4 or send us a message through MyChart.   We are unable to tell what your co-pay for medications will be in advance as this is different depending on your insurance coverage. However, we may be able to find a substitute medication at lower cost or fill out paperwork to get insurance to cover a needed medication.   If a prior authorization is required to get your medication covered by your insurance company, please allow us 1-2 business days to complete this process.  Drug prices often vary depending on where the prescription is filled and some pharmacies may offer cheaper prices.  The website www.goodrx.com contains coupons for medications through different pharmacies. The prices here do not account for what the cost may be with help from insurance (it may be cheaper with your insurance), but the website can give you the price if you did not use any insurance.  - You can print the associated coupon and take it with   your prescription to the pharmacy.  - You may also stop by our office during regular business hours and pick up a GoodRx coupon card.  - If you need your prescription sent electronically to a different pharmacy, notify our office through [place] MyChart or by phone at [phone] option 4.     Si Usted Necesita Algo Despus de Su Visita  Tambin puede enviarnos un mensaje a travs de MyChart. Por lo general respondemos a los mensajes de MyChart en el transcurso de 1 a 2  das hbiles.  Para renovar recetas, por favor pida a su farmacia que se ponga en contacto con nuestra oficina. Nuestro nmero de fax es el [phone].  Si tiene un asunto urgente cuando la clnica est cerrada y que no puede esperar hasta el siguiente da hbil, puede llamar/localizar a su doctor(a) al nmero que aparece a continuacin.   Por favor, tenga en cuenta que aunque hacemos todo lo posible para estar disponibles para asuntos urgentes fuera del horario de oficina, no estamos disponibles las 24 horas del da, los 7 das de la semana.   Si tiene un problema urgente y no puede comunicarse con nosotros, puede optar por buscar atencin mdica  en el consultorio de su doctor(a), en una clnica privada, en un centro de atencin urgente o en una sala de emergencias.  Si tiene una emergencia mdica, por favor llame inmediatamente al 911 o vaya a la sala de emergencias.  Nmeros de bper  - Dr. Kowalski: [phone]  - Dra. Moye: [phone]  - Dra. Stewart: [phone]  En caso de inclemencias del tiempo, por favor llame a nuestra lnea principal al [phone] para una actualizacin sobre el estado de cualquier retraso o cierre.  Consejos para la medicacin en dermatologa: Por favor, guarde las cajas en las que vienen los medicamentos de uso tpico para ayudarle a seguir las instrucciones sobre dnde y cmo usarlos. Las farmacias generalmente imprimen las instrucciones del medicamento slo en las cajas y no directamente en los tubos del medicamento.   Si su medicamento es muy caro, por favor, pngase en contacto con nuestra oficina llamando al [phone] y presione la opcin 4 o envenos un mensaje a travs de MyChart.   No podemos decirle cul ser su copago por los medicamentos por adelantado ya que esto es diferente dependiendo de la cobertura de su seguro. Sin embargo, es posible que podamos encontrar un medicamento sustituto a menor costo o llenar un formulario para que el  seguro cubra el medicamento que se considera necesario.   Si se requiere una autorizacin previa para que su compaa de seguros cubra su medicamento, por favor permtanos de 1 a 2 das hbiles para completar este proceso.  Los precios de los medicamentos varan con frecuencia dependiendo del lugar de dnde se surte la receta y alguna farmacias pueden ofrecer precios ms baratos.  El sitio web www.goodrx.com tiene cupones para medicamentos de diferentes farmacias. Los precios aqu no tienen en cuenta lo que podra costar con la ayuda del seguro (puede ser ms barato con su seguro), pero el sitio web puede darle el precio si no utiliz ningn seguro.  - Puede imprimir el cupn correspondiente y llevarlo con su receta a la farmacia.  - Tambin puede pasar por nuestra oficina durante el horario de atencin regular y recoger una tarjeta de cupones de GoodRx.  - Si necesita que su receta se enve electrnicamente a una farmacia diferente, informe a nuestra oficina a travs de MyChart de [place]   o por telfono llamando al [phone] y presione la opcin 4.  

## 2022-10-02 ENCOUNTER — Telehealth: Payer: Self-pay

## 2022-10-02 LAB — HEPATIC FUNCTION PANEL
ALT: 33 IU/L (ref 0–44)
AST: 24 IU/L (ref 0–40)
Albumin: 4.5 g/dL (ref 4.3–5.2)
Alkaline Phosphatase: 294 IU/L — ABNORMAL HIGH (ref 44–121)
Bilirubin Total: 0.6 mg/dL (ref 0.0–1.2)
Bilirubin, Direct: 0.15 mg/dL (ref 0.00–0.40)
Total Protein: 6.8 g/dL (ref 6.0–8.5)

## 2022-10-02 LAB — LIPID PANEL
Chol/HDL Ratio: 7.1 ratio — ABNORMAL HIGH (ref 0.0–5.0)
Cholesterol, Total: 163 mg/dL (ref 100–199)
HDL: 23 mg/dL — ABNORMAL LOW (ref >39)
LDL Chol Calc (NIH): 108 mg/dL — ABNORMAL HIGH (ref 0–99)
Triglycerides: 183 mg/dL — ABNORMAL HIGH (ref 0–149)
VLDL Cholesterol Cal: 32 mg/dL (ref 5–40)

## 2022-10-02 MED ORDER — ABSORICA LD 32 MG PO CAPS: 2.0000 | ORAL_CAPSULE | Freq: Every day | ORAL | 0 refills | Status: DC

## 2022-10-02 NOTE — Telephone Encounter (Signed)
-----   Message from Willeen Niece, MD sent at 10/02/2022  9:31 AM EDT ----- Labs okay, continue isotretinoin as per note - please call patient

## 2022-10-02 NOTE — Telephone Encounter (Signed)
Advised pt of lab results.  Pt confirmed in IPLEDGE program.  Absorica LD 32mg  2 po qd sent to [facility].Kevin Ramos

## 2022-10-05 ENCOUNTER — Ambulatory Visit: Payer: BC Managed Care – PPO | Admitting: Endocrinology

## 2022-10-05 ENCOUNTER — Encounter: Payer: Self-pay | Admitting: Endocrinology

## 2022-10-05 VITALS — BP 110/90 | HR 94 | Ht 69.5 in | Wt 193.2 lb

## 2022-10-05 DIAGNOSIS — D751 Secondary polycythemia: Secondary | ICD-10-CM

## 2022-10-05 DIAGNOSIS — E230 Hypopituitarism: Secondary | ICD-10-CM

## 2022-10-05 DIAGNOSIS — E038 Other specified hypothyroidism: Secondary | ICD-10-CM

## 2022-10-05 DIAGNOSIS — E201 Pseudohypoparathyroidism: Secondary | ICD-10-CM

## 2022-10-05 DIAGNOSIS — E876 Hypokalemia: Secondary | ICD-10-CM | POA: Diagnosis not present

## 2022-10-05 NOTE — Patient Instructions (Addendum)
Donate blood every 2-3 months  Calcitriol daily

## 2022-10-05 NOTE — Progress Notes (Unsigned)
Patient ID: Kevin Ramos, male   DOB: [birth year], 27 y.o.   MRN: [id number]             Chief complaint: Endocrinology follow-up  History of Present Illness:  Problem 1: HYPOCALCEMIA  RECENT history:  Since 4/21 he has been on 0.5 mcg of calcitriol, previously had required larger doses Also takes 2 tablets of 1200 mg calcium once or twice a day  He has taken his regimen very regularly as prescribed  Usually has complaints of tingling in the hands or face cramping or stiffness associated with low calcium levels  Recently not complaining of tingling sensation in the face or hands or cramping in the muscles  Calcium is again slightly below normal He has likely missed his calcitriol periodically in the mornings despite trying to take it with his other medication Also taking calcium   Lab Results  Component Value Date   CALCIUM 8.1 (L) 09/18/2022   CALCIUM 8.4 (L) 06/12/2022   CALCIUM 8.3 (L) 03/06/2022    VITAMIN D deficiency: He is taking vitamin D3, 2000 units daily   Lab Results  Component Value Date   VD25OH 20.83 (L) 03/25/2019   VD25OH 30.7 10/29/2017   VD25OH 41.22 12/27/2016   HYPOTHYROIDISM:  He had thyroid functions checked because of fatigue in 02/2019 Free T4 was low normal at 0.63 as of 2/21 and also TSH was mildly increased He was told to start taking levothyroxine 25 mcg daily This was subsequently increased to maintain a normal free T4 level  He continues to have some fatigue  He is continuing on 75 mcg levothyroxine since 9/21 Free T4 normal as before at 1.1   Lab Results  Component Value Date   TSH 3.85 04/12/2021   TSH 4.06 02/14/2021   TSH 1.92 07/20/2020   FREET4 1.09 09/18/2022   FREET4 1.21 03/06/2022   FREET4 1.22 07/25/2021   Problem 2: HYPOGONADISM:  Because of his significant fatigue, lack of energy and motivation testosterone was checked and free testosterone was low on 2 occasions LH normal along with  prolactin  Previously was consistently using his 4 mg Androderm and was able to continue despite some scaling irritation He was switched to AndroGel because of insurance preference  Because of continued fatigue and low testosterone levels with AndroGel higher doses he was switched to [place] However even with 237 mg dose he had a low testosterone level along with high normal hemoglobin   [place] was stopped because he says it gave him recurrent sinus infections He is now taking XYOSTED 75 mg weekly since 07/2022 after insurance approval  He still feels fatigued with no improvement with switching  Testosterone level is back to normal now and was done right before his injection His hematocrit is slightly high   Lab Results  Component Value Date   TESTOSTERONE 403.1 09/18/2022   TESTOSTERONE 162.9 (L) 06/12/2022   TESTOSTERONE 191.5 (L) 03/06/2022   TESTOSTERONE 162 (L) 09/25/2021   TESTOSTERONE 262 (L) 07/25/2021   Lab Results  Component Value Date   TESTOFREE 6.7 (L) 05/05/2019   TESTOFREE 7.5 (L) 04/30/2019   Lab Results  Component Value Date   HCT 51.2 (H) 09/18/2022       Past Medical History:  Diagnosis Date  . Acne   . Depression   . Dysplastic nevus 02/09/2019   right upper back, moderate atypia, close to margin.  Marland Kitchen Dysplastic nevus 05/24/2022   Left inferior side above waistline. Moderate atypia, closed to but  does not involve margin.    No past surgical history on file.  Family History  Problem Relation Age of Onset  . Hypoparathyroidism Mother     Social History:  reports that he has never smoked. He has never used smokeless tobacco. He reports that he does not drink alcohol and does not use drugs.   Allergies:  Allergies  Allergen Reactions  . Abilify [Aripiprazole] Nausea Only  . Effexor [Venlafaxine] Nausea Only    Allergies as of 10/05/2022       Reactions   Abilify [aripiprazole] Nausea Only   Effexor [venlafaxine] Nausea Only         Medication List        Accurate as of October 05, 2022  8:41 AM. If you have any questions, ask your nurse or doctor.          Absorica LD 32 MG Caps Generic drug: ISOtretinoin Micronized Take 2 capsules by mouth daily.   acetaminophen 500 MG tablet Commonly known as: TYLENOL Take 500 mg by mouth every 6 (six) hours as needed.   acetaminophen-codeine 300-30 MG tablet Commonly known as: TYLENOL #3 Take 1-2 tablets by mouth every 4 (four) hours as needed for moderate pain.   alclomethasone 0.05 % ointment Commonly known as: ACLOVATE Apply topically to lips qd/bid prn for dryness   amphetamine-dextroamphetamine 5 MG tablet Commonly known as: Adderall Take 1 tablet (5 mg total) by mouth daily.   asenapine 5 MG Subl 24 hr tablet Commonly known as: SAPHRIS Place under the tongue.   azelastine 0.1 % nasal spray Commonly known as: ASTELIN Place into the nose.   calcitRIOL 0.5 MCG capsule Commonly known as: ROCALTROL TAKE 1 CAPSULE BY MOUTH DAILY   CALCIUM 1200 PO Take 2 capsules by mouth daily.   calcium carbonate 1500 (600 Ca) MG Tabs tablet Commonly known as: OSCAL Take by mouth.   cetirizine 10 MG tablet Commonly known as: ZYRTEC Take by mouth.   cyanocobalamin 1000 MCG tablet Take by mouth.   Descovy 200-25 MG tablet Generic drug: emtricitabine-tenofovir AF Take 1 tablet by mouth daily.   dexamethasone 1 MG tablet Commonly known as: DECADRON Take by mouth.   DULoxetine 30 MG capsule Commonly known as: Cymbalta Take 2 capsules in the morning and 1 capsule in the evening   emtricitabine-tenofovir 200-300 MG tablet Commonly known as: TRUVADA Take 1 tablet by mouth daily.   ferrous sulfate 325 (65 FE) MG EC tablet Take 1 tablet by mouth every other day.   fluticasone 50 MCG/ACT nasal spray Commonly known as: FLONASE Place into the nose.   hydrocortisone 2.5 % ointment Apply to corner of mouth twice daily for up to 1 week.   ketoconazole 2 %  cream Commonly known as: NIZORAL Apply to groin QD.   ketoconazole 2 % shampoo Commonly known as: NIZORAL Apply 1 Application topically as directed. Shampoo scalp 3 times per week   levothyroxine 75 MCG tablet Commonly known as: SYNTHROID TAKE ONE TABLET BY MOUTH EVERY MORNING BEFORE BREAKFAST   lidocaine 5 % Commonly known as: LIDODERM 1 patch daily.   meloxicam 15 MG tablet Commonly known as: MOBIC TAKE 1 TABLET BY MOUTH EVERY DAY   mupirocin ointment 2 % Commonly known as: BACTROBAN Apply to skin qd-bid   nabumetone 500 MG tablet Commonly known as: RELAFEN Take 500 mg by mouth 2 (two) times daily.   nabumetone 500 MG tablet Commonly known as: RELAFEN Take 1 tablet by mouth 2 (two) times daily.  nystatin 100000 UNIT/ML suspension Commonly known as: MYCOSTATIN Take by mouth.   potassium chloride 8 MEQ tablet Commonly known as: KLOR-CON TAKE 1 TABLET (8 MEQ TOTAL) BY MOUTH 3 (THREE) TIMES DAILY.   pregabalin 75 MG capsule Commonly known as: LYRICA Start with 75mg  capsule at bedtime and if doing well, may increase to twice a day after 5 days   pregabalin 75 MG capsule Commonly known as: LYRICA Take 1 capsule by mouth 2 (two) times daily.   tacrolimus 0.1 % ointment Commonly known as: PROTOPIC Apply topically as directed. Qd to bid to aa lips prn flares   triamcinolone ointment 0.1 % Commonly known as: KENALOG Apply 1 application topically 2 (two) times daily as needed (Rash). Use for up to 2 weeks on affected areas of arms  Avoid applying to face, groin, and axilla. Use as directed.   Vitamin D (Ergocalciferol) 1.25 MG (50000 UNIT) Caps capsule Commonly known as: DRISDOL Take 50,000 Units by mouth once a week.   Vitamin D 50 MCG (2000 UT) tablet Take 2,000 Units by mouth daily.   Xyosted 75 MG/0.5ML Soaj Generic drug: Testosterone Enanthate Inject 75 mg weekly into fatty part of stomach            Review of Systems  Previously concerned about  weight gain, this is improved  Wt Readings from Last 3 Encounters:  10/05/22 193 lb 3.2 oz (87.6 kg)  10/01/22 197 lb (89.4 kg)  08/27/22 197 lb (89.4 kg)   Vitamin B12 level has been normal  Blood pressure is generally normal  BP Readings from Last 3 Encounters:  10/05/22 (!) 110/90  10/01/22 112/85  08/27/22 124/75    PHYSICAL EXAM:  BP (!) 110/90 (BP Location: Left Arm, Patient Position: Sitting)   Pulse 94   Ht 5' 9.5" (1.765 m)   Wt 193 lb 3.2 oz (87.6 kg)   SpO2 97%   BMI 28.12 kg/m    ASSESSMENT:    HYPOGONADISM: 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  His treatment consists of Xyosted since 5/24 With other forms of therapy including oral testosterone he had low testosterone levels consistently  He has persistent fatigue which is likely unrelated to hypogonadism Testosterone level is finally normal but his hematocrit is slightly high  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 Currently asymptomatic His low levels are again related to missing doses of his calcitriol periodically  HYPOKALEMIA: Etiology unclear Potassium is improved on supplements  PLAN:   For now he will continue Xyosted 75 mg weekly Encouraged him to start donating blood at least every 2 to 3 months to offset the increased hematocrit He will try to get the next testosterone level done midweek between his injections Continue potassium supplement He needs to take his calcium consistently every morning with his other medications Continue potassium supplements Follow-up in 3 months   Freddye Cardamone 10/05/2022, 8:41 AM

## 2022-10-08 DIAGNOSIS — F3131 Bipolar disorder, current episode depressed, mild: Secondary | ICD-10-CM | POA: Diagnosis not present

## 2022-10-11 DIAGNOSIS — R5382 Chronic fatigue, unspecified: Secondary | ICD-10-CM | POA: Diagnosis not present

## 2022-10-11 DIAGNOSIS — M461 Sacroiliitis, not elsewhere classified: Secondary | ICD-10-CM | POA: Diagnosis not present

## 2022-10-11 DIAGNOSIS — Z7182 Exercise counseling: Secondary | ICD-10-CM | POA: Diagnosis not present

## 2022-10-11 DIAGNOSIS — F419 Anxiety disorder, unspecified: Secondary | ICD-10-CM | POA: Diagnosis not present

## 2022-10-11 DIAGNOSIS — M797 Fibromyalgia: Secondary | ICD-10-CM | POA: Diagnosis not present

## 2022-10-11 DIAGNOSIS — Z79899 Other long term (current) drug therapy: Secondary | ICD-10-CM | POA: Diagnosis not present

## 2022-10-11 DIAGNOSIS — G8929 Other chronic pain: Secondary | ICD-10-CM | POA: Diagnosis not present

## 2022-10-11 DIAGNOSIS — F32A Depression, unspecified: Secondary | ICD-10-CM | POA: Diagnosis not present

## 2022-10-30 DIAGNOSIS — G4733 Obstructive sleep apnea (adult) (pediatric): Secondary | ICD-10-CM | POA: Diagnosis not present

## 2022-11-01 ENCOUNTER — Ambulatory Visit: Payer: BC Managed Care – PPO | Admitting: Dermatology

## 2022-11-01 VITALS — Wt 197.0 lb

## 2022-11-01 DIAGNOSIS — Z79899 Other long term (current) drug therapy: Secondary | ICD-10-CM | POA: Diagnosis not present

## 2022-11-01 DIAGNOSIS — L853 Xerosis cutis: Secondary | ICD-10-CM

## 2022-11-01 DIAGNOSIS — K130 Diseases of lips: Secondary | ICD-10-CM

## 2022-11-01 DIAGNOSIS — L700 Acne vulgaris: Secondary | ICD-10-CM

## 2022-11-01 DIAGNOSIS — Z7189 Other specified counseling: Secondary | ICD-10-CM

## 2022-11-01 MED ORDER — ABSORICA LD 32 MG PO CAPS: 2.0000 | ORAL_CAPSULE | Freq: Every day | ORAL | 0 refills | Status: DC

## 2022-11-01 NOTE — Patient Instructions (Signed)
Acne is Severe; chronic and persistent; not at goal. Patient is on Isotretinoin -  requiring FDA mandated monthly evaluations and laboratory monitoring.  While taking isotretinoin, do not share pills and do not donate blood. Generic isotretinoin is best absorbed when taken with a fatty meal. Isotretinoin can make you sensitive to the sun. Daily careful sun protection including sunscreen SPF 30+ when outdoors is recommended.    Due to recent changes in healthcare laws, you may see results of your pathology and/or laboratory studies on MyChart before the doctors have had a chance to review them. We understand that in some cases there may be results that are confusing or concerning to you. Please understand that not all results are received at the same time and often the doctors may need to interpret multiple results in order to provide you with the best plan of care or course of treatment. Therefore, we ask that you please give [place] 2 business days to thoroughly review all your results before contacting the office for clarification. Should we see a critical lab result, you will be contacted sooner.   If You Need Anything After Your Visit  If you have any questions or concerns for your doctor, please call our main line at [phone] and press option 4 to reach your doctor's medical assistant. If no one answers, please leave a voicemail as directed and we will return your call as soon as possible. Messages left after 4 pm will be answered the following business day.   You may also send [place] a message via MyChart. We typically respond to MyChart messages within 1-2 business days.  For prescription refills, please ask your pharmacy to contact our office. Our fax number is [phone].  If you have an urgent issue when the clinic is closed that cannot wait until the next business day, you can page your doctor at the number below.    Please note that while we do our best to be available for urgent issues  outside of office hours, we are not available 24/7.   If you have an urgent issue and are unable to reach [place], you may choose to seek medical care at your doctor's office, retail clinic, urgent care center, or emergency room.  If you have a medical emergency, please immediately call 911 or go to the emergency department.  Pager Numbers  - Dr. Gwen Pounds: [phone]  - Dr. Roseanne Reno: [phone]  In the event of inclement weather, please call our main line at [phone] for an update on the status of any delays or closures.  Dermatology Medication Tips: Please keep the boxes that topical medications come in in order to help keep track of the instructions about where and how to use these. Pharmacies typically print the medication instructions only on the boxes and not directly on the medication tubes.   If your medication is too expensive, please contact our office at [phone] option 4 or send [place] a message through MyChart.   We are unable to tell what your co-pay for medications will be in advance as this is different depending on your insurance coverage. However, we may be able to find a substitute medication at lower cost or fill out paperwork to get insurance to cover a needed medication.   If a prior authorization is required to get your medication covered by your insurance company, please allow [place] 1-2 business days to complete this process.  Drug prices often vary depending on where the prescription is filled and some pharmacies  may offer cheaper prices.  The website www.goodrx.com contains coupons for medications through different pharmacies. The prices here do not account for what the cost may be with help from insurance (it may be cheaper with your insurance), but the website can give you the price if you did not use any insurance.  - You can print the associated coupon and take it with your prescription to the pharmacy.  - You may also stop by our office during regular business  hours and pick up a GoodRx coupon card.  - If you need your prescription sent electronically to a different pharmacy, notify our office through [facility] or by phone at [phone] option 4.     Si Usted Necesita Algo Despus de Su Visita  Tambin puede enviarnos un mensaje a travs de Clinical cytogeneticist. Por lo general respondemos a los mensajes de MyChart en el transcurso de 1 a 2 das hbiles.  Para renovar recetas, por favor pida a su farmacia que se ponga en contacto con nuestra oficina. Annie Sable de fax es [place] [phone].  Si tiene un asunto urgente cuando la clnica est cerrada y que no puede esperar hasta el siguiente da hbil, puede llamar/localizar a su doctor(a) al nmero que aparece a continuacin.   Por favor, tenga en cuenta que aunque hacemos todo lo posible para estar disponibles para asuntos urgentes fuera del horario de [place], no estamos disponibles las 24 horas del da, los 7 [address]  [address] de la [place].   Si tiene un problema urgente y no puede comunicarse con nosotros, puede optar por buscar atencin mdica  en el consultorio de su doctor(a), en una clnica privada, en un centro de atencin urgente o en una sala de emergencias.  Si tiene Engineer, drilling, por favor llame inmediatamente al 911 o vaya a la sala de emergencias.  Nmeros de bper  - Dr. Gwen Pounds: [phone]  - Dra. Roseanne Reno: [phone]  En caso de inclemencias del [place], por favor llame a Lacy Duverney principal al [phone] para una actualizacin sobre el [place] de cualquier retraso o cierre.  Consejos para la medicacin en dermatologa: Por favor, guarde las cajas en las que vienen los medicamentos de uso tpico para ayudarle a seguir las instrucciones sobre dnde y cmo usarlos. Las farmacias generalmente imprimen las instrucciones del medicamento slo en las cajas y no directamente en los tubos del [place].   Si su medicamento es muy caro, por favor, pngase en contacto con Rolm Gala llamando al [phone] y presione la opcin 4 o envenos un mensaje a travs de Clinical cytogeneticist.   No podemos decirle cul ser su copago por los medicamentos por adelantado ya que esto es diferente dependiendo de la cobertura de su seguro. Sin embargo, es posible que podamos encontrar un medicamento sustituto a Audiological scientist un formulario para que el seguro cubra el medicamento que se considera necesario.   Si se requiere una autorizacin previa para que su compaa de seguros [place] su medicamento, por favor permtanos de 1 a 2 das hbiles para completar [address]  Los precios de los medicamentos varan con frecuencia dependiendo del Environmental consultant de dnde se surte la receta y alguna farmacias pueden ofrecer precios ms baratos.  El sitio web www.goodrx.com tiene cupones para medicamentos de Health and safety inspector. Los precios aqu no tienen en cuenta lo que podra costar con la ayuda del seguro (puede ser ms barato con su seguro), pero el sitio web puede darle el precio si no utiliz Tourist information centre manager.  -  Puede imprimir el cupn correspondiente y llevarlo con su receta a la farmacia.  - Tambin puede pasar por nuestra oficina durante el horario de atencin regular y Education officer, museum una tarjeta de cupones de GoodRx.  - Si necesita que su receta se enve electrnicamente a una farmacia diferente, informe a nuestra oficina a travs de MyChart de [place] o por telfono llamando al [phone] y presione la opcin 4.

## 2022-11-01 NOTE — Progress Notes (Signed)
   Isotretinoin Follow-Up Visit   Subjective  Kevin Ramos is a 27 y.o. male who presents for the following: Isotretinoin follow-up Absorica LD 32 mg 2 po every day - No mood changes, depression or suicidal ideations  Week # 20   Isotretinoin F/U - 11/01/22 0900       Isotretinoin Follow Up   iPledge # [id number]    Date 11/01/22    Weight 197 lb (89.4 kg)    Acne breakouts since last visit? No      Dosage   Target Dosage (mg) 13455    Current (To Date) Dosage (mg) 6900    To Go Dosage (mg) 6555      Side Effects   Skin Chapped Lips;Dry Eyes    Gastrointestinal WNL    Neurological WNL    Constitutional WNL              Side effects: Dry skin, dry lips  The following portions of the chart were reviewed this encounter and updated as appropriate: medications, allergies, medical history  Review of Systems:  No other skin or systemic complaints except as noted in HPI or Assessment and Plan.  Objective  Well appearing patient in no apparent distress; mood and affect are within normal limits.  An examination of the face, neck, chest, and back was performed and relevant findings are noted below.     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 Condition is currently not to goal (must reach target dose based on weight and also have clear skin for 2 months prior to discontinuation in order to help prevent relapse)  Exam findings: Few active areas of chest, shoulders, neck Week # 20 Pharmacy [facility] # [id number] Total mg -  6900 Total mg/kg - 78.7  Continue Absorica LD 32 mg 2 po every day - will plan at least 3 more months  Patient confirmed in iPledge and isotretinoin sent to pharmacy.    Xerosis secondary to isotretinoin therapy - Continue emollients as directed - Xyzal (levocetirizine) once a day and fish oil 1 gram daily may also help  with dryness   Cheilitis secondary to isotretinoin therapy - Continue lip balm as directed, Dr. Clayborne Artist Cortibalm recommended   Long term medication management (isotretinoin).  Patient is using long term (months to years) prescription medication to control their dermatologic condition.  These medications require periodic monitoring to evaluate for efficacy and side effects and may require periodic laboratory monitoring.  - While taking Isotretinoin and for 30 days after you finish the medication, do not share pills, do not donate blood. It is very important that a women who could become pregnant not take this medicine or get a blood transfusion with this medicine in it. Isotretinoin is best absorbed when taken with a fatty meal. Isotretinoin can make you sensitive to the sun. Daily careful sun protection including sunscreen SPF 30+ when outdoors is recommended.  Follow-up in 30 days.  I, Joanie Coddington, CMA, am acting as scribe for Armida Sans, MD .   Documentation: I have reviewed the above documentation for accuracy and completeness, and I agree with the above.  Armida Sans, MD

## 2022-11-11 ENCOUNTER — Encounter: Payer: Self-pay | Admitting: Dermatology

## 2022-11-12 DIAGNOSIS — F3131 Bipolar disorder, current episode depressed, mild: Secondary | ICD-10-CM | POA: Diagnosis not present

## 2022-11-13 DIAGNOSIS — F3189 Other bipolar disorder: Secondary | ICD-10-CM | POA: Diagnosis not present

## 2022-11-13 DIAGNOSIS — F902 Attention-deficit hyperactivity disorder, combined type: Secondary | ICD-10-CM | POA: Diagnosis not present

## 2022-11-13 DIAGNOSIS — Z79899 Other long term (current) drug therapy: Secondary | ICD-10-CM | POA: Diagnosis not present

## 2022-11-13 DIAGNOSIS — F3181 Bipolar II disorder: Secondary | ICD-10-CM | POA: Diagnosis not present

## 2022-11-13 DIAGNOSIS — F411 Generalized anxiety disorder: Secondary | ICD-10-CM | POA: Diagnosis not present

## 2022-11-15 ENCOUNTER — Ambulatory Visit: Payer: BC Managed Care – PPO | Admitting: Podiatry

## 2022-11-15 ENCOUNTER — Encounter: Payer: Self-pay | Admitting: Podiatry

## 2022-11-15 DIAGNOSIS — Z01818 Encounter for other preprocedural examination: Secondary | ICD-10-CM | POA: Diagnosis not present

## 2022-11-15 DIAGNOSIS — M62461 Contracture of muscle, right lower leg: Secondary | ICD-10-CM

## 2022-11-15 DIAGNOSIS — M722 Plantar fascial fibromatosis: Secondary | ICD-10-CM

## 2022-11-15 NOTE — Progress Notes (Signed)
Subjective:  Patient ID: Kevin Ramos, male    DOB: January 11, 1996,  MRN: [id number]  Chief Complaint  Patient presents with   Plantar Fasciitis    27 y.o. male presents with the above complaint.  Patient presents with continuous pain to bilateral plantar fasciitis right greater than left side.  He states is about the same.  He has failed all conservative care he wants to discuss surgical options  Review of Systems: Negative except as noted in the HPI. Denies N/V/F/Ch.  Past Medical History:  Diagnosis Date   Acne    Depression    Dysplastic nevus 02/09/2019   right upper back, moderate atypia, close to margin.   Dysplastic nevus 05/24/2022   Left inferior side above waistline. Moderate atypia, closed to but does not involve margin.    Current Outpatient Medications:    acetaminophen (TYLENOL) 500 MG tablet, Take 500 mg by mouth every 6 (six) hours as needed., Disp: , Rfl:    acetaminophen-codeine (TYLENOL #3) 300-30 MG tablet, Take 1-2 tablets by mouth every 4 (four) hours as needed for moderate pain., Disp: 30 tablet, Rfl: 0   alclomethasone (ACLOVATE) 0.05 % ointment, Apply topically to lips qd/bid prn for dryness, Disp: 30 g, Rfl: 1   amphetamine-dextroamphetamine (ADDERALL) 5 MG tablet, Take 1 tablet (5 mg total) by mouth daily., Disp: 30 tablet, Rfl: 0   asenapine (SAPHRIS) 5 MG SUBL 24 hr tablet, Place under the tongue., Disp: , Rfl:    azelastine (ASTELIN) 0.1 % nasal spray, Place into the nose., Disp: , Rfl:    calcitRIOL (ROCALTROL) 0.5 MCG capsule, TAKE 1 CAPSULE BY MOUTH DAILY, Disp: 90 capsule, Rfl: 1   calcium carbonate (OSCAL) 1500 (600 Ca) MG TABS tablet, Take by mouth., Disp: , Rfl:    Calcium Carbonate-Vit D-Min (CALCIUM 1200 PO), Take 2 capsules by mouth daily. , Disp: , Rfl:    cetirizine (ZYRTEC) 10 MG tablet, Take by mouth., Disp: , Rfl:    Cholecalciferol (VITAMIN D) 50 MCG (2000 UT) tablet, Take 2,000 Units by mouth daily., Disp: , Rfl:    cyanocobalamin  1000 MCG tablet, Take by mouth., Disp: , Rfl:    DESCOVY 200-25 MG tablet, Take 1 tablet by mouth daily., Disp: , Rfl:    DULoxetine (CYMBALTA) 30 MG capsule, Take 2 capsules in the morning and 1 capsule in the evening, Disp: 90 capsule, Rfl: 1   emtricitabine-tenofovir (TRUVADA) 200-300 MG tablet, Take 1 tablet by mouth daily., Disp: , Rfl:    ferrous sulfate 325 (65 FE) MG EC tablet, Take 1 tablet by mouth every other day., Disp: , Rfl:    fluticasone (FLONASE) 50 MCG/ACT nasal spray, Place into the nose., Disp: , Rfl:    hydrocortisone 2.5 % ointment, Apply to corner of mouth twice daily for up to 1 week., Disp: 30 g, Rfl: 0   ISOtretinoin Micronized (ABSORICA LD) 32 MG CAPS, Take 2 capsules by mouth daily., Disp: 60 capsule, Rfl: 0   ketoconazole (NIZORAL) 2 % cream, Apply to groin QD., Disp: 60 g, Rfl: 0   ketoconazole (NIZORAL) 2 % shampoo, Apply 1 Application topically as directed. Shampoo scalp 3 times per week, Disp: 120 mL, Rfl: 11   levothyroxine (SYNTHROID) 75 MCG tablet, TAKE ONE TABLET BY MOUTH EVERY MORNING BEFORE BREAKFAST, Disp: 90 tablet, Rfl: 0   lidocaine (LIDODERM) 5 %, 1 patch daily., Disp: , Rfl:    meloxicam (MOBIC) 15 MG tablet, TAKE 1 TABLET BY MOUTH EVERY DAY, Disp: 30 tablet,  Rfl: 0   mupirocin ointment (BACTROBAN) 2 %, Apply to skin qd-bid, Disp: 22 g, Rfl: 0   nabumetone (RELAFEN) 500 MG tablet, Take 500 mg by mouth 2 (two) times daily., Disp: , Rfl:    nabumetone (RELAFEN) 500 MG tablet, Take 1 tablet by mouth 2 (two) times daily., Disp: , Rfl:    nystatin (MYCOSTATIN) 100000 UNIT/ML suspension, Take by mouth., Disp: , Rfl:    potassium chloride (KLOR-CON) 8 MEQ tablet, TAKE 1 TABLET (8 MEQ TOTAL) BY MOUTH 3 (THREE) TIMES DAILY., Disp: 270 tablet, Rfl: 1   pregabalin (LYRICA) 75 MG capsule, Start with 75mg  capsule at bedtime and if doing well, may increase to twice a day after 5 days, Disp: , Rfl:    pregabalin (LYRICA) 75 MG capsule, Take 1 capsule by mouth 2 (two)  times daily., Disp: , Rfl:    tacrolimus (PROTOPIC) 0.1 % ointment, Apply topically as directed. Qd to bid to aa lips prn flares, Disp: 60 g, Rfl: 1   Testosterone Enanthate (XYOSTED) 75 MG/0.5ML SOAJ, Inject 75 mg weekly into fatty part of stomach, Disp: 1.96 mL, Rfl: 3   triamcinolone ointment (KENALOG) 0.1 %, Apply 1 application topically 2 (two) times daily as needed (Rash). Use for up to 2 weeks on affected areas of arms  Avoid applying to face, groin, and axilla. Use as directed., Disp: 60 g, Rfl: 1   Vitamin D, Ergocalciferol, (DRISDOL) 1.25 MG (50000 UNIT) CAPS capsule, Take 50,000 Units by mouth once a week., Disp: , Rfl:   Social History   Tobacco Use  Smoking Status Never  Smokeless Tobacco Never    Allergies  Allergen Reactions   Abilify [Aripiprazole] Nausea Only   Effexor [Venlafaxine] Nausea Only   Objective:   There were no vitals filed for this visit.  There is no height or weight on file to calculate BMI. Constitutional Well developed. Well nourished.  Vascular Dorsalis pedis pulses palpable bilaterally. Posterior tibial pulses palpable bilaterally. Capillary refill normal to all digits.  No cyanosis or clubbing noted. Pedal hair growth normal.  Neurologic Normal speech. Oriented to person, place, and time. Epicritic sensation to light touch grossly present bilaterally.  Dermatologic Nails well groomed and normal in appearance. No open wounds. No skin lesions.  Orthopedic: Normal joint ROM without pain or crepitus bilaterally. No visible deformities. Tender to palpation at the calcaneal tuber bilateral No pain with calcaneal squeeze right Ankle ROM diminished range of motion bilateral Silfverskiold Test: positive right   Radiographs: Taken and reviewed. No acute fractures or dislocations. No evidence of stress fracture.  Plantar heel spur present. Posterior heel spur present.   Assessment:   1. Plantar fasciitis, right   2. Gastrocnemius equinus,  right      Plan:  Patient was evaluated and treated and all questions answered.  Plantar Fasciitis, bilateral right greater than left side with underlying gastrocnemius equinus -Clinically her right plantar fasciitis has not resolved and continues to fail all conservative care including shoe gear modification padding offloading orthotics injections cam boot immobilization at this time I discussed with the patient would benefit from surgical release of the plantar fascia.  I discussed endoscopic plantar fasciotomy with gastrocnemius recession.  I discussed with the patient in extensive detail.  I discussed my preoperative intra or postoperative plan with the patient in extensive detail he states understand like to proceed with surgery -Informed surgical risk consent was reviewed and read aloud to the patient.  I reviewed the films.  I have  discussed my findings with the patient in great detail.  I have discussed all risks including but not limited to infection, stiffness, scarring, limp, disability, deformity, damage to blood vessels and nerves, numbness, poor healing, need for braces, arthritis, chronic pain, amputation, death.  All benefits and realistic expectations discussed in great detail.  I have made no promises as to the outcome.  I have provided realistic expectations.  I have offered the patient a 2nd opinion, which they have declined and assured me they preferred to proceed despite the risks   No follow-ups on file.

## 2022-11-21 DIAGNOSIS — F3131 Bipolar disorder, current episode depressed, mild: Secondary | ICD-10-CM | POA: Diagnosis not present

## 2022-11-22 DIAGNOSIS — Z79899 Other long term (current) drug therapy: Secondary | ICD-10-CM | POA: Diagnosis not present

## 2022-11-22 DIAGNOSIS — F3181 Bipolar II disorder: Secondary | ICD-10-CM | POA: Diagnosis not present

## 2022-11-27 ENCOUNTER — Telehealth: Payer: Self-pay | Admitting: Podiatry

## 2022-11-27 DIAGNOSIS — F902 Attention-deficit hyperactivity disorder, combined type: Secondary | ICD-10-CM | POA: Diagnosis not present

## 2022-11-27 DIAGNOSIS — F411 Generalized anxiety disorder: Secondary | ICD-10-CM | POA: Diagnosis not present

## 2022-11-27 DIAGNOSIS — F3181 Bipolar II disorder: Secondary | ICD-10-CM | POA: Diagnosis not present

## 2022-11-27 NOTE — Telephone Encounter (Signed)
Called patient to schedule surgery, no answer left voicemail.

## 2022-11-29 DIAGNOSIS — R7989 Other specified abnormal findings of blood chemistry: Secondary | ICD-10-CM | POA: Diagnosis not present

## 2022-11-30 DIAGNOSIS — G4733 Obstructive sleep apnea (adult) (pediatric): Secondary | ICD-10-CM | POA: Diagnosis not present

## 2022-12-03 ENCOUNTER — Ambulatory Visit: Payer: BC Managed Care – PPO | Admitting: Dermatology

## 2022-12-03 ENCOUNTER — Encounter: Payer: Self-pay | Admitting: Dermatology

## 2022-12-03 VITALS — BP 118/80 | HR 84 | Wt 197.0 lb

## 2022-12-03 DIAGNOSIS — L853 Xerosis cutis: Secondary | ICD-10-CM

## 2022-12-03 DIAGNOSIS — Z79899 Other long term (current) drug therapy: Secondary | ICD-10-CM

## 2022-12-03 DIAGNOSIS — L700 Acne vulgaris: Secondary | ICD-10-CM | POA: Diagnosis not present

## 2022-12-03 DIAGNOSIS — R7989 Other specified abnormal findings of blood chemistry: Secondary | ICD-10-CM | POA: Diagnosis not present

## 2022-12-03 DIAGNOSIS — K130 Diseases of lips: Secondary | ICD-10-CM | POA: Diagnosis not present

## 2022-12-03 MED ORDER — ABSORICA LD 32 MG PO CAPS: 2.0000 | ORAL_CAPSULE | Freq: Every day | ORAL | 0 refills | Status: DC

## 2022-12-03 NOTE — Patient Instructions (Addendum)

## 2022-12-03 NOTE — Progress Notes (Signed)
Isotretinoin Follow-Up Visit   Subjective  Kevin Ramos is a 27 y.o. male who presents for the following: Isotretinoin follow-up  Week # 24   Isotretinoin F/U - 12/03/22 0800       Isotretinoin Follow Up   iPledge # [id number]    Date 12/03/22    Weight 197 lb (89.4 kg)    Acne breakouts since last visit? Yes      Dosage   Target Dosage (mg) 13455    Current (To Date) Dosage (mg) 9300    To Go Dosage (mg) 4155      Skin Side Effects   Dry Lips Yes    Dry eyes Yes    Sunburn Yes      Gastrointestinal Side Effects   Nausea No    Diarrhea No    Blood in stool No      Neurological Side Effects   Blurred vision No    Depression No    Headache No    Homicidal thoughts No    Mood Changes No    Suicidal thoughts No      Constitutional Side Effects   Fatigue No      Musculoskeletal Side Effects   Muscle aches No              Side effects: Dry skin, dry lips  The following portions of the chart were reviewed this encounter and updated as appropriate: medications, allergies, medical history  Review of Systems:  No other skin or systemic complaints except as noted in HPI or Assessment and Plan.  Objective  Well appearing patient in no apparent distress; mood and affect are within normal limits.  An examination of the face, neck, chest, and back was performed and relevant findings are noted below.     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 Condition is currently not to goal (must reach target dose based on weight and also have clear skin for 2 months prior to discontinuation in order to help prevent relapse)  Exam findings: Chest with scattered inflamed papules. Posterior shoulders and upper back with scattered inflamed papules, hypertrophic scars of all aa's.  Week # 24 [facility] #[id number]  Total mg -   9,300 Total mg/kg - 104.0  Continue Absorica LD 32mg  2 po QD  Patient confirmed in iPledge and isotretinoin sent to pharmacy.    Xerosis secondary to isotretinoin therapy - Continue emollients as directed - Xyzal (levocetirizine) once a day and fish oil 1 gram daily may also help with dryness - Recommend Vanicream cream moisturizer daily.   Cheilitis secondary to isotretinoin therapy - Continue lip balm as directed, Dr. Clayborne Artist Cortibalm recommended - Continue Aclovate ointment to aa's BID PRN.  Long term medication management (isotretinoin).  Patient is using long term (months to years) prescription medication to control their dermatologic condition.  These medications require periodic monitoring to evaluate for efficacy and side effects and may require periodic laboratory monitoring.  - While taking Isotretinoin and for 30 days after you finish the medication, do not share pills, do not donate blood. It is very important that a women who could become pregnant not take this medicine or get a blood transfusion with this medicine in it. Isotretinoin is best absorbed when taken with a fatty meal. Isotretinoin can make you sensitive to the sun. Daily careful sun protection including sunscreen SPF 30+ when outdoors is  recommended.  Follow-up in 30 days.  Kevin Ramos, CMA, am acting as scribe for Elie Goody, MD .   Documentation: I have reviewed the above documentation for accuracy and completeness, and I agree with the above.  Elie Goody, MD

## 2022-12-04 ENCOUNTER — Ambulatory Visit: Payer: BC Managed Care – PPO | Admitting: Podiatry

## 2022-12-04 DIAGNOSIS — M722 Plantar fascial fibromatosis: Secondary | ICD-10-CM

## 2022-12-04 NOTE — Progress Notes (Signed)
Subjective:  Patient ID: Kevin Ramos, male    DOB: 1995-07-20,  MRN: [id number]  Chief Complaint  Patient presents with   Plantar Fasciitis    27 y.o. male presents with the above complaint.  Patient presents for follow-up of bilateral plantar fasciitis right greater than left side.  He is not able to undergo surgery due to financial restrictions.  For now he would like to focus on injection therapy  Review of Systems: Negative except as noted in the HPI. Denies N/V/F/Ch.  Past Medical History:  Diagnosis Date   Acne    Depression    Dysplastic nevus 02/09/2019   right upper back, moderate atypia, close to margin.   Dysplastic nevus 05/24/2022   Left inferior side above waistline. Moderate atypia, closed to but does not involve margin.    Current Outpatient Medications:    acetaminophen (TYLENOL) 500 MG tablet, Take 500 mg by mouth every 6 (six) hours as needed., Disp: , Rfl:    alclomethasone (ACLOVATE) 0.05 % ointment, Apply topically to lips qd/bid prn for dryness, Disp: 30 g, Rfl: 1   amphetamine-dextroamphetamine (ADDERALL) 5 MG tablet, Take 1 tablet (5 mg total) by mouth daily., Disp: 30 tablet, Rfl: 0   asenapine (SAPHRIS) 5 MG SUBL 24 hr tablet, Place under the tongue., Disp: , Rfl:    azelastine (ASTELIN) 0.1 % nasal spray, Place into the nose., Disp: , Rfl:    calcitRIOL (ROCALTROL) 0.5 MCG capsule, TAKE 1 CAPSULE BY MOUTH DAILY, Disp: 90 capsule, Rfl: 1   calcium carbonate (OSCAL) 1500 (600 Ca) MG TABS tablet, Take by mouth., Disp: , Rfl:    Calcium Carbonate-Vit D-Min (CALCIUM 1200 PO), Take 2 capsules by mouth daily. , Disp: , Rfl:    cetirizine (ZYRTEC) 10 MG tablet, Take by mouth., Disp: , Rfl:    cyanocobalamin 1000 MCG tablet, Take by mouth., Disp: , Rfl:    DULoxetine (CYMBALTA) 30 MG capsule, Take 2 capsules in the morning and 1 capsule in the evening, Disp: 90 capsule, Rfl: 1   emtricitabine-tenofovir (TRUVADA) 200-300 MG tablet, Take 1 tablet by mouth  daily., Disp: , Rfl:    ferrous sulfate 325 (65 FE) MG EC tablet, Take 1 tablet by mouth every other day., Disp: , Rfl:    FLUoxetine (PROZAC) 10 MG tablet, Take 10 mg by mouth daily., Disp: , Rfl:    fluticasone (FLONASE) 50 MCG/ACT nasal spray, Place into the nose., Disp: , Rfl:    ISOtretinoin Micronized (ABSORICA LD) 32 MG CAPS, Take 2 capsules by mouth daily., Disp: 60 capsule, Rfl: 0   ketoconazole (NIZORAL) 2 % cream, Apply to groin QD., Disp: 60 g, Rfl: 0   ketoconazole (NIZORAL) 2 % shampoo, Apply 1 Application topically as directed. Shampoo scalp 3 times per week, Disp: 120 mL, Rfl: 11   levothyroxine (SYNTHROID) 75 MCG tablet, TAKE ONE TABLET BY MOUTH EVERY MORNING BEFORE BREAKFAST, Disp: 90 tablet, Rfl: 0   lidocaine (LIDODERM) 5 %, 1 patch daily., Disp: , Rfl:    meloxicam (MOBIC) 15 MG tablet, TAKE 1 TABLET BY MOUTH EVERY DAY, Disp: 30 tablet, Rfl: 0   nystatin (MYCOSTATIN) 100000 UNIT/ML suspension, Take by mouth., Disp: , Rfl:    potassium chloride (KLOR-CON) 8 MEQ tablet, TAKE 1 TABLET (8 MEQ TOTAL) BY MOUTH 3 (THREE) TIMES DAILY., Disp: 270 tablet, Rfl: 1   pregabalin (LYRICA) 75 MG capsule, Take 1 capsule by mouth 2 (two) times daily., Disp: , Rfl:    tacrolimus (PROTOPIC) 0.1 %  ointment, Apply topically as directed. Qd to bid to aa lips prn flares, Disp: 60 g, Rfl: 1   Testosterone Enanthate (XYOSTED) 75 MG/0.5ML SOAJ, Inject 75 mg weekly into fatty part of stomach, Disp: 1.96 mL, Rfl: 3   Vitamin D, Ergocalciferol, (DRISDOL) 1.25 MG (50000 UNIT) CAPS capsule, Take 50,000 Units by mouth once a week., Disp: , Rfl:   Social History   Tobacco Use  Smoking Status Never  Smokeless Tobacco Never    Allergies  Allergen Reactions   Abilify [Aripiprazole] Nausea Only   Effexor [Venlafaxine] Nausea Only   Objective:   There were no vitals filed for this visit.  There is no height or weight on file to calculate BMI. Constitutional Well developed. Well nourished.  Vascular  Dorsalis pedis pulses palpable bilaterally. Posterior tibial pulses palpable bilaterally. Capillary refill normal to all digits.  No cyanosis or clubbing noted. Pedal hair growth normal.  Neurologic Normal speech. Oriented to person, place, and time. Epicritic sensation to light touch grossly present bilaterally.  Dermatologic Nails well groomed and normal in appearance. No open wounds. No skin lesions.  Orthopedic: Normal joint ROM without pain or crepitus bilaterally. No visible deformities. Tender to palpation at the calcaneal tuber bilateral No pain with calcaneal squeeze right Ankle ROM diminished range of motion bilateral Silfverskiold Test: positive right   Radiographs: Taken and reviewed. No acute fractures or dislocations. No evidence of stress fracture.  Plantar heel spur present. Posterior heel spur present.   Assessment:   1. Plantar fasciitis, right   2. Plantar fasciitis of left foot      Plan:  Patient was evaluated and treated and all questions answered.  Plantar Fasciitis, bilateral right greater than left side - XR reviewed as above.  - Educated on icing and stretching. Instructions given.  - Injection delivered to the plantar fascia as below. - DME: Continue plantar Fascial Brace - Pharmacologic management: None  Procedure: Injection Tendon/Ligament Location: Bilateral plantar fascia at the glabrous junction; medial approach. Skin Prep: alcohol Injectate: 0.5 cc 0.5% marcaine plain, 0.5 cc of 1% Lidocaine, 0.5 cc kenalog 10. Disposition: Patient tolerated procedure well. Injection site dressed with a band-aid.  No follow-ups on file.

## 2022-12-05 DIAGNOSIS — F3131 Bipolar disorder, current episode depressed, mild: Secondary | ICD-10-CM | POA: Diagnosis not present

## 2022-12-13 DIAGNOSIS — F902 Attention-deficit hyperactivity disorder, combined type: Secondary | ICD-10-CM | POA: Diagnosis not present

## 2022-12-13 DIAGNOSIS — F411 Generalized anxiety disorder: Secondary | ICD-10-CM | POA: Diagnosis not present

## 2022-12-13 DIAGNOSIS — F3181 Bipolar II disorder: Secondary | ICD-10-CM | POA: Diagnosis not present

## 2022-12-16 ENCOUNTER — Ambulatory Visit
Admission: RE | Admit: 2022-12-16 | Discharge: 2022-12-16 | Disposition: A | Discharge status: Home / Self Care | Payer: BC Managed Care – PPO | Source: Ambulatory Visit | Attending: Emergency Medicine | Admitting: Emergency Medicine

## 2022-12-16 VITALS — BP 119/78 | HR 110 | Temp 98.1°F | Resp 18

## 2022-12-16 DIAGNOSIS — J0100 Acute maxillary sinusitis, unspecified: Secondary | ICD-10-CM | POA: Diagnosis not present

## 2022-12-16 MED ORDER — AMOXICILLIN 875 MG PO TABS
875.0000 mg | ORAL_TABLET | Freq: Two times a day (BID) | ORAL | 0 refills | Status: AC
Start: 2022-12-16 — End: 2022-12-26

## 2022-12-16 NOTE — ED Provider Notes (Signed)
Kevin Ramos    CSN: [id number] Arrival date & time: 12/16/22  1040      History   Chief Complaint Chief Complaint  Patient presents with   Nasal Congestion   Cough   Otalgia    HPI Kevin Ramos is a 27 y.o. male.  Patient presents with 5-day history of ear pain, nasal congestion, sinus pressure, postnasal drip, cough.  No fever, chest pain, shortness of breath, or other symptoms.  No OTC medications taken today.  The history is provided by the patient and medical records.    Past Medical History:  Diagnosis Date   Acne    Depression    Dysplastic nevus 02/09/2019   right upper back, moderate atypia, close to margin.   Dysplastic nevus 05/24/2022   Left inferior side above waistline. Moderate atypia, closed to but does not involve margin.    Patient Active Problem List   Diagnosis Date Noted   Class 1 obesity due to excess calories with serious comorbidity and body mass index (BMI) of 31.0 to 31.9 in adult 02/20/2021   Hypertriglyceridemia 02/20/2021   Neuropathy of both feet 02/20/2021   MDD (major depressive disorder), recurrent, in partial remission (HCC) 07/25/2020   Fibromyalgia 07/25/2020   Attention deficit hyperactivity disorder (ADHD), predominantly inattentive type 06/22/2020   Status post hip replacement, right 12/11/2019   Hypokalemia 11/23/2019   Hypertrophy of nasal turbinates 10/12/2019   Deviated nasal septum 10/12/2019   Osteoarthritis of right hip 10/10/2019   Legg-Calve-Perthes disease, right 09/22/2019   Non-seasonal allergic rhinitis 09/21/2019   Sacroiliitis (HCC) 07/23/2019   Acute hip pain, bilateral 07/23/2019   MDD (major depressive disorder), recurrent, in full remission (HCC) 07/22/2019   Dyspnea on exertion 07/02/2019   Low testosterone in male 06/30/2019   Vitamin D deficiency 06/16/2019   Acquired hypothyroidism 06/16/2019   Anxiety 05/08/2018   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 No pertinent surgical history.     Home Medications    Prior to Admission medications   Medication Sig Start Date End Date Taking? Authorizing Provider  amoxicillin (AMOXIL) 875 MG tablet Take 1 tablet (875 mg total) by mouth 2 (two) times daily for 10 days. 12/16/22 12/26/22 Yes Mickie Bail, NP  acetaminophen (TYLENOL) 500 MG tablet Take 500 mg by mouth every 6 (six) hours as needed.    [provider]  alclomethasone (ACLOVATE) 0.05 % ointment Apply topically to lips qd/bid prn for dryness 08/27/22   Willeen Niece, MD  amphetamine-dextroamphetamine (ADDERALL) 5 MG tablet Take 1 tablet (5 mg total) by mouth daily. 10/06/20   Zena Amos, MD  asenapine (SAPHRIS) 5 MG SUBL 24 hr tablet Place under the tongue. 02/21/21   [provider]  azelastine (ASTELIN) 0.1 % nasal spray Place into the nose. 12/16/16   [provider]  calcitRIOL (ROCALTROL) 0.5 MCG capsule TAKE 1 CAPSULE BY MOUTH DAILY 02/14/21   Reather Littler, MD  calcium carbonate (OSCAL) 1500 (600 Ca) MG TABS tablet Take by mouth. 01/14/18   [provider]  Calcium Carbonate-Vit D-Min (CALCIUM 1200 PO) Take 2 capsules by mouth daily.     [provider]  cetirizine (ZYRTEC) 10 MG tablet Take  by mouth.    [provider]  cyanocobalamin 1000 MCG tablet Take by mouth. 02/20/21   [provider]  DULoxetine (CYMBALTA) 30 MG capsule Take 2 capsules in the morning and 1 capsule in the evening 09/07/20   Zena Amos, MD  emtricitabine-tenofovir (TRUVADA) 200-300 MG tablet Take 1 tablet by mouth daily. 12/29/21   [provider]  ferrous sulfate 325 (65 FE)  MG EC tablet Take 1 tablet by mouth every other day. 10/12/21   [provider]  FLUoxetine (PROZAC) 10 MG tablet Take 10 mg by mouth daily.    [provider]  fluticasone (FLONASE) 50 MCG/ACT nasal spray Place into the nose.    [provider]  ISOtretinoin Micronized (ABSORICA LD) 32 MG CAPS Take 2 capsules by mouth daily. 12/03/22   Elie Goody, MD  ketoconazole (NIZORAL) 2 % cream Apply to groin QD. 11/20/21   Deirdre Evener, MD  ketoconazole (NIZORAL) 2 % shampoo Apply 1 Application topically as directed. Shampoo scalp 3 times per week 05/24/22   Deirdre Evener, MD  levothyroxine (SYNTHROID) 75 MCG tablet TAKE ONE TABLET BY MOUTH EVERY MORNING BEFORE BREAKFAST 02/06/21   Reather Littler, MD  lidocaine (LIDODERM) 5 % 1 patch daily. 04/09/21   [provider]  meloxicam (MOBIC) 15 MG tablet TAKE 1 TABLET BY MOUTH EVERY DAY 12/20/21   Candelaria Stagers, DPM  nystatin (MYCOSTATIN) 100000 UNIT/ML suspension Take by mouth. 02/23/20   [provider]  potassium chloride (KLOR-CON) 8 MEQ tablet TAKE 1 TABLET (8 MEQ TOTAL) BY MOUTH 3 (THREE) TIMES DAILY. 09/25/22   Reather Littler, MD  pregabalin (LYRICA) 75 MG capsule Take 1 capsule by mouth 2 (two) times daily. 12/11/21   [provider]  tacrolimus (PROTOPIC) 0.1 % ointment Apply topically as directed. Qd to bid to aa lips prn flares 07/26/22   Deirdre Evener, MD  Testosterone Enanthate (XYOSTED) 75 MG/0.5ML SOAJ Inject 75 mg weekly into fatty part of stomach 08/03/22   Shamleffer, Konrad Dolores, MD  Vitamin D, Ergocalciferol, (DRISDOL) 1.25 MG (50000 UNIT) CAPS capsule Take 50,000 Units by mouth once a week. 10/12/21   [provider]    Family History Family History  Problem Relation Age of Onset   Hypoparathyroidism Mother     Social History Social History   Tobacco Use   Smoking status: Never   Smokeless tobacco: Never  Substance Use Topics   Alcohol use: Never   Drug use: No      Allergies   Abilify [aripiprazole] and Effexor [venlafaxine]   Review of Systems Review of Systems  Constitutional:  Positive for chills. Negative for fever.  HENT:  Positive for congestion, ear pain, postnasal drip, rhinorrhea and sinus pressure. Negative for sore throat.   Respiratory:  Positive for cough. Negative for shortness of breath.   Cardiovascular:  Negative for chest pain and palpitations.     Physical Exam Triage Vital Signs ED Triage Vitals [12/16/22 1114]  Encounter Vitals Group     BP      Systolic BP Percentile      Diastolic BP Percentile      Pulse Rate (!) 110     Resp 18     Temp 98.1 F (36.7 C)     Temp src      SpO2 95 %     Weight      Height      Head Circumference      Peak Flow  Pain Score      Pain Loc      Pain Education      Exclude from Growth Chart    No data found.  Updated Vital Signs BP 119/78   Pulse (!) 110   Temp 98.1 F (36.7 C)   Resp 18   SpO2 95%   Visual Acuity Right Eye Distance:   Left Eye Distance:   Bilateral Distance:    Right Eye Near:   Left Eye Near:    Bilateral Near:     Physical Exam Vitals and nursing note reviewed.  Constitutional:      General: He is not in acute distress.    Appearance: He is well-developed.  HENT:     Right Ear: Tympanic membrane normal.     Left Ear: Tympanic membrane normal.     Nose: Congestion and rhinorrhea present.     Mouth/Throat:     Mouth: Mucous membranes are moist.     Pharynx: Oropharynx is clear.  Cardiovascular:     Rate and Rhythm: Normal rate and regular rhythm.     Heart sounds: Normal heart sounds.  Pulmonary:     Effort: Pulmonary effort is normal. No respiratory distress.     Breath sounds: Normal breath sounds.  Musculoskeletal:     Cervical back: Neck supple.  Skin:    General: Skin is warm and dry.  Neurological:     Mental Status: He is alert.  Psychiatric:        Mood and Affect: Mood normal.        Behavior: Behavior  normal.      UC Treatments / Results  Labs (all labs ordered are listed, but only abnormal results are displayed) Labs Reviewed - No data to display  EKG   Radiology No results found.  Procedures Procedures (including critical care time)  Medications Ordered in UC Medications - No data to display  Initial Impression / Assessment and Plan / UC Course  I have reviewed the triage vital signs and the nursing notes.  Pertinent labs & imaging results that were available during my care of the patient were reviewed by me and considered in my medical decision making (see chart for details).    Acute sinusitis.  Treating with amoxicillin.  Tylenol as needed.  Education provided on sinus infection.  Instructed patient follow-up with his PCP if he is not improving.  He agrees to plan of care.  Final Clinical Impressions(s) / UC Diagnoses   Final diagnoses:  Acute non-recurrent maxillary sinusitis     Discharge Instructions      Take the amoxicillin as directed.  Follow-up with your primary care provider if your symptoms are not improving.      ED Prescriptions     Medication Sig Dispense Auth. Provider   amoxicillin (AMOXIL) 875 MG tablet Take 1 tablet (875 mg total) by mouth 2 (two) times daily for 10 days. 20 tablet Mickie Bail, NP      PDMP not reviewed this encounter.   Mickie Bail, NP 12/16/22 1141

## 2022-12-16 NOTE — Discharge Instructions (Addendum)
Take the amoxicillin as directed.  Follow up with your primary care provider if your symptoms are not improving.   ° ° °

## 2022-12-16 NOTE — ED Triage Notes (Signed)
Patient to Urgent Care with complaints of nasal congestion/ cough/ ear pain. Bilateral ear pain/ post nasal drip. Reports that he has had chills/ hot episodes.   Symptoms started five days ago.  No OTC medications attempted.

## 2022-12-24 ENCOUNTER — Encounter: Payer: Self-pay | Admitting: Dermatology

## 2022-12-24 DIAGNOSIS — F3131 Bipolar disorder, current episode depressed, mild: Secondary | ICD-10-CM | POA: Diagnosis not present

## 2022-12-25 MED ORDER — MUPIROCIN 2 % EX OINT: TOPICAL_OINTMENT | CUTANEOUS | 0 refills | Status: AC

## 2022-12-30 DIAGNOSIS — G4733 Obstructive sleep apnea (adult) (pediatric): Secondary | ICD-10-CM | POA: Diagnosis not present

## 2023-01-07 ENCOUNTER — Ambulatory Visit: Payer: BC Managed Care – PPO | Admitting: Dermatology

## 2023-01-07 VITALS — BP 113/78 | HR 95 | Wt 197.0 lb

## 2023-01-07 DIAGNOSIS — L853 Xerosis cutis: Secondary | ICD-10-CM

## 2023-01-07 DIAGNOSIS — L700 Acne vulgaris: Secondary | ICD-10-CM

## 2023-01-07 DIAGNOSIS — L720 Epidermal cyst: Secondary | ICD-10-CM

## 2023-01-07 DIAGNOSIS — Z79899 Other long term (current) drug therapy: Secondary | ICD-10-CM

## 2023-01-07 DIAGNOSIS — K130 Diseases of lips: Secondary | ICD-10-CM | POA: Diagnosis not present

## 2023-01-07 DIAGNOSIS — Z7189 Other specified counseling: Secondary | ICD-10-CM

## 2023-01-07 DIAGNOSIS — L729 Follicular cyst of the skin and subcutaneous tissue, unspecified: Secondary | ICD-10-CM

## 2023-01-07 DIAGNOSIS — L701 Acne conglobata: Secondary | ICD-10-CM

## 2023-01-07 MED ORDER — ABSORICA LD 32 MG PO CAPS: 2.0000 | ORAL_CAPSULE | Freq: Every day | ORAL | 0 refills | Status: DC

## 2023-01-07 NOTE — Patient Instructions (Signed)

## 2023-01-07 NOTE — Progress Notes (Unsigned)
Isotretinoin Follow-Up Visit   Subjective  Kevin Ramos is a 27 y.o. male who presents for the following: Isotretinoin follow-up. Patient still having breakouts on chest, neck, back.   Week # 28   Isotretinoin F/U - 01/07/23 0800       Isotretinoin Follow Up   iPledge # [id number]    Date 01/07/23    Weight 197 lb (89.4 kg)    Acne breakouts since last visit? Yes      Dosage   Target Dosage (mg) 17880   Current (To Date) Dosage (mg) 11700    To Go Dosage (mg) 6180     Skin Side Effects   Dry Lips Yes    Nose bleeds No    Dry eyes Yes    Dry Skin Yes    Sunburn No      Gastrointestinal Side Effects   Nausea No    Diarrhea No    Blood in stool No      Neurological Side Effects   Blurred vision No    Depression No    Headache No    Homicidal thoughts No    Mood Changes No    Suicidal thoughts No      Constitutional Side Effects   Fatigue No      Musculoskeletal Side Effects   Muscle aches No              Side effects: Dry skin, dry lips  The following portions of the chart were reviewed this encounter and updated as appropriate: medications, allergies, medical history  Review of Systems:  No other skin or systemic complaints except as noted in HPI or Assessment and Plan.  Objective  Well appearing patient in no apparent distress; mood and affect are within normal limits.  An examination of the face, neck, chest, and back was performed and relevant findings are noted below.     Assessment & Plan   EPIDERMAL INCLUSION CYST Exam: Subcutaneous nodule at right face Benign-appearing. Exam most consistent with an epidermal inclusion cyst. Discussed that a cyst is a benign growth that can grow over time and sometimes get irritated or inflamed. Recommend observation if it is not bothersome. Discussed option of surgical excision to remove it if it is growing, symptomatic, or other changes noted. Please call for new or changing lesions so they can  be evaluated.   ACNE VULGARIS Patient is currently on Isotretinoin requiring FDA mandated monthly evaluations and laboratory monitoring. Condition is currently not to goal (must reach target dose based on weight and also have clear skin for 2 months prior to discontinuation in order to help prevent relapse)  Exam findings: Active papule on the right neck; scarring of the upper back and shoulders  Week # 28 [facility]acy [facility] #[id number] Total mg -  11,700 mg Total mg/kg - 130.8 mg/kg  Recommend patient get up to 200 mg/kg.  Continue Absorica LD 32 MG take 2 po every day with food dsp #60 0Rf.  Patient confirmed in iPledge and isotretinoin sent to pharmacy.    Xerosis secondary to isotretinoin therapy - Continue emollients as directed - Xyzal (levocetirizine) once a day and fish oil 1 gram daily may also help with dryness   Cheilitis secondary to isotretinoin therapy - Continue lip balm as directed, Dr. Clayborne Artist Cortibalm recommended   Long term medication management (isotretinoin).  Patient is using long term (months to years) prescription medication to control their dermatologic condition.  These medications require periodic monitoring  to evaluate for efficacy and side effects and may require periodic laboratory monitoring.  - While taking Isotretinoin and for 30 days after you finish the medication, do not share pills, do not donate blood. It is very important that a women who could become pregnant not take this medicine or get a blood transfusion with this medicine in it. Isotretinoin is best absorbed when taken with a fatty meal. Isotretinoin can make you sensitive to the sun. Daily careful sun protection including sunscreen SPF 30+ when outdoors is recommended.  Follow-up in 30 days.  Wendee Beavers, CMA, am acting as scribe for Armida Sans, MD .   Documentation: I have reviewed the above documentation for accuracy and completeness, and I agree with the  above.  Armida Sans, MD

## 2023-01-09 ENCOUNTER — Encounter: Payer: Self-pay | Admitting: Dermatology

## 2023-01-14 DIAGNOSIS — F3131 Bipolar disorder, current episode depressed, mild: Secondary | ICD-10-CM | POA: Diagnosis not present

## 2023-01-15 DIAGNOSIS — F3181 Bipolar II disorder: Secondary | ICD-10-CM | POA: Diagnosis not present

## 2023-01-15 DIAGNOSIS — Z79899 Other long term (current) drug therapy: Secondary | ICD-10-CM | POA: Diagnosis not present

## 2023-01-15 DIAGNOSIS — F902 Attention-deficit hyperactivity disorder, combined type: Secondary | ICD-10-CM | POA: Diagnosis not present

## 2023-01-15 DIAGNOSIS — F411 Generalized anxiety disorder: Secondary | ICD-10-CM | POA: Diagnosis not present

## 2023-01-17 ENCOUNTER — Telehealth: Payer: Self-pay | Admitting: Family Medicine

## 2023-01-17 NOTE — Telephone Encounter (Signed)
Pt wants to schedule the 2nd dose of the Monkey Pox vaccine, but I have no record of the 1st one. Thus I am referring him to you for the initial screening, unless it is no longer required to schedule his appt. Thanks

## 2023-01-28 ENCOUNTER — Ambulatory Visit: Payer: BC Managed Care – PPO

## 2023-01-28 ENCOUNTER — Ambulatory Visit: Payer: Self-pay

## 2023-01-28 DIAGNOSIS — Z23 Encounter for immunization: Secondary | ICD-10-CM | POA: Diagnosis not present

## 2023-01-28 DIAGNOSIS — Z719 Counseling, unspecified: Secondary | ICD-10-CM

## 2023-01-28 NOTE — Progress Notes (Addendum)
Presents to nurse clinic requesting Monkeypox vaccine #2.  Received #1 11/18/20 and states he had no problems.  See immunization flowsheet.    VISs given.  Monkeypox ([place]) vaccine administered SQ left arm; tolerated well.  Declined to stay for 10 minute observation after vaccine given.  Reviewed after vaccine care.  [facility] updated and copy to pt.  Cherlynn Polo, RN

## 2023-01-30 ENCOUNTER — Other Ambulatory Visit: Payer: Self-pay | Admitting: Endocrinology

## 2023-01-30 ENCOUNTER — Ambulatory Visit: Payer: BC Managed Care – PPO | Admitting: Endocrinology

## 2023-01-30 ENCOUNTER — Encounter: Payer: Self-pay | Admitting: Endocrinology

## 2023-01-30 VITALS — BP 132/80 | HR 106 | Resp 20 | Ht 69.5 in | Wt 182.4 lb

## 2023-01-30 DIAGNOSIS — G4733 Obstructive sleep apnea (adult) (pediatric): Secondary | ICD-10-CM | POA: Diagnosis not present

## 2023-01-30 DIAGNOSIS — E230 Hypopituitarism: Secondary | ICD-10-CM | POA: Diagnosis not present

## 2023-01-30 DIAGNOSIS — E201 Pseudohypoparathyroidism: Secondary | ICD-10-CM | POA: Diagnosis not present

## 2023-01-30 DIAGNOSIS — E876 Hypokalemia: Secondary | ICD-10-CM

## 2023-01-30 DIAGNOSIS — E559 Vitamin D deficiency, unspecified: Secondary | ICD-10-CM

## 2023-01-30 DIAGNOSIS — E039 Hypothyroidism, unspecified: Secondary | ICD-10-CM | POA: Diagnosis not present

## 2023-01-30 LAB — T4, FREE: Free T4: 0.7 ng/dL (ref 0.60–1.60)

## 2023-01-30 LAB — RENAL FUNCTION PANEL
Albumin: 4.4 g/dL (ref 3.5–5.2)
BUN: 8 mg/dL (ref 6–23)
CO2: 29 meq/L (ref 19–32)
Calcium: 7.7 mg/dL — ABNORMAL LOW (ref 8.4–10.5)
Chloride: 97 meq/L (ref 96–112)
Creatinine, Ser: 0.9 mg/dL (ref 0.40–1.50)
GFR: 115.42 mL/min (ref >60.00)
Glucose, Bld: 92 mg/dL (ref 70–99)
Phosphorus: 3.4 mg/dL (ref 2.3–4.6)
Potassium: 3 meq/L — ABNORMAL LOW (ref 3.5–5.1)
Sodium: 137 meq/L (ref 135–145)

## 2023-01-30 LAB — VITAMIN D 25 HYDROXY (VIT D DEFICIENCY, FRACTURES): VITD: 16.75 ng/mL — ABNORMAL LOW (ref 30.00–100.00)

## 2023-01-30 LAB — MAGNESIUM: Magnesium: 2 mg/dL (ref 1.5–2.5)

## 2023-01-30 NOTE — Progress Notes (Addendum)
Outpatient Endocrinology Note [place] Mahrukh Seguin, MD  01/30/23  Patient's Name: Kevin Ramos    DOB: 1995/11/18    MRN: [id number]  REASON OF VISIT: Follow-up for hypothyroidism / hypocalcemia / hypoparathyroidism / hypogonadism  PCP: Marisue Ivan, MD  HISTORY OF PRESENT ILLNESS:   Kevin Ramos is a 27 y.o. old male with past medical history as listed below is presented for a follow up of hypoparathyroidism / hypocalcemia / hypothyroidism / hypogonadism.  Patient was last seen by Dr. Lucianne Muss in July 2024.  Pertinent History:  # Pseudo hypoparathyroidism/hypocalcemia - Patient has pseudohypoparathyroidism, PTH was as high as 1500 in the past, and hypocalcemia.  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  Mother has pseudohypoparathyroidism as well. PTH was 760 in 09/2021 ( at [facility]).  -Currently on calcitriol 0.5 mcg daily.  Calcium 1200 mg 2 tablets 1-2 times a day.  Vitamin D3 ?  2000 international unit daily.  # Acquired hypothyroidism :  -Patient was diagnosed with hypothyroidism in December 2020 when TSH was mildly elevated 5.14, he was restarted on levothyroxine 25 mcg daily and possibly adjusted over time.  He is currently taking levothyroxine 75 mcg daily.  There was question of ? secondary hypothyroidism as well based on prior endocrinology office note  # Hypogonadism -Due to fatigue, low motivation, testosterone levels were checked and found to have low free testosterone on 2 occasions in February 2021, and normal LH and prolactin.  Continue with started on testosterone therapy.  Previously he was on Androderm 4 mg, which was switched to AndroGel.  He had taken Jatenzo in the past as well.  However even with 237 mcg dose 2 times a day he continued to have low testosterone with hide normal hemoglobin.  He had taken the test to what he thought because of recurrent sinus infection.  He is currently taking Xyosted 75 mg weekly since  May 2024.  With review of medical record in care everywhere he has also been following at [facility] endocrinology for hypogonadism.   Interval history 01/30/23 Patient reports taking testosterone injection every week.  He reports taking levothyroxine every day in the morning.  He stated that he is not fully compliant with taking calcium, vitamin D3 and calcitriol intake.  He has mild dysuria however denies muscle cramps and tingling and the numbness of the extremities or lips.  REVIEW OF SYSTEMS:  As per history of present illness.   PAST MEDICAL HISTORY: Past Medical History:  Diagnosis Date   Acne    Depression    Dysplastic nevus 02/09/2019   right upper back, moderate atypia, close to margin.   Dysplastic nevus 05/24/2022   Left inferior side above waistline. Moderate atypia, closed to but does not involve margin.    PAST SURGICAL HISTORY: History reviewed. No pertinent surgical history.  ALLERGIES: Allergies  Allergen Reactions   Abilify [Aripiprazole] Nausea Only   Effexor [Venlafaxine] Nausea Only    FAMILY HISTORY:  Family History  Problem Relation Age of Onset   Hypoparathyroidism Mother     SOCIAL HISTORY: Social History   Socioeconomic History   Marital status: Single    Spouse name: Not on file   Number of children: Not on file   Years of education: Not on file   Highest education level: Not on file  Occupational History   Not on file  Tobacco Use   Smoking status: Never   Smokeless tobacco: Never  Substance and Sexual Activity   Alcohol use:  Never   Drug use: No   Sexual activity: Not on file  Other Topics Concern   Not on file  Social History Narrative   Not on file   Social Determinants of Health   Financial Resource Strain: Low Risk  (07/16/2022)   Received from [facility], [facility]   Overall Financial Resource Strain (CARDIA)    Difficulty of Paying Living Expenses: Not hard at all  Food Insecurity:  No Food Insecurity (07/16/2022)   Received from [facility], [facility]   Hunger Vital Sign    Worried About Running Out of Food in the Last Year: Never true    Ran Out of Food in the Last Year: Never true  Transportation Needs: No Transportation Needs (07/16/2022)   Received from [facility], [facility]   [facility] - Transportation    In the past 12 months, has lack of transportation kept you from medical appointments or from getting medications?: No    Lack of Transportation (Non-Medical): No  Physical Activity: Not on file  Stress: Not on file  Social Connections: Not on file    MEDICATIONS:  Current Outpatient Medications  Medication Sig Dispense Refill   acetaminophen (TYLENOL) 500 MG tablet Take 500 mg by mouth every 6 (six) hours as needed.     alclomethasone (ACLOVATE) 0.05 % ointment Apply topically to lips qd/bid prn for dryness 30 g 1   amphetamine-dextroamphetamine (ADDERALL) 5 MG tablet Take 1 tablet (5 mg total) by mouth daily. 30 tablet 0   asenapine (SAPHRIS) 5 MG SUBL 24 hr tablet Place under the tongue.     azelastine (ASTELIN) 0.1 % nasal spray Place into the nose.     calcitRIOL (ROCALTROL) 0.5 MCG capsule TAKE 1 CAPSULE BY MOUTH DAILY 90 capsule 1   calcium carbonate (OSCAL) 1500 (600 Ca) MG TABS tablet Take by mouth.     Calcium Carbonate-Vit D-Min (CALCIUM 1200 PO) Take 2 capsules by mouth daily.      cetirizine (ZYRTEC) 10 MG tablet Take by mouth.     cyanocobalamin 1000 MCG tablet Take by mouth.     DULoxetine (CYMBALTA) 30 MG capsule Take 2 capsules in the morning and 1 capsule in the evening 90 capsule 1   emtricitabine-tenofovir (TRUVADA) 200-300 MG tablet Take 1 tablet by mouth daily.     ferrous sulfate 325 (65 FE) MG EC tablet Take 1 tablet by mouth every other day.     FLUoxetine (PROZAC) 10 MG tablet Take 10 mg by mouth daily.     fluticasone (FLONASE) 50 MCG/ACT nasal spray Place  into the nose.     ISOtretinoin Micronized (ABSORICA LD) 32 MG CAPS Take 2 capsules by mouth daily. 60 capsule 0   ketoconazole (NIZORAL) 2 % cream Apply to groin QD. 60 g 0   ketoconazole (NIZORAL) 2 % shampoo Apply 1 Application topically as directed. Shampoo scalp 3 times per week 120 mL 11   levothyroxine (SYNTHROID) 75 MCG tablet TAKE ONE TABLET BY MOUTH EVERY MORNING BEFORE BREAKFAST 90 tablet 0   lidocaine (LIDODERM) 5 % 1 patch daily.     meloxicam (MOBIC) 15 MG tablet TAKE 1 TABLET BY MOUTH EVERY DAY 30 tablet 0   mupirocin ointment (BACTROBAN) 2 % Apply to aa TID x 2 weeks. 22 g 0   nystatin (MYCOSTATIN) 100000 UNIT/ML suspension Take by mouth.     potassium chloride (KLOR-CON) 8 MEQ tablet TAKE 1  TABLET (8 MEQ TOTAL) BY MOUTH 3 (THREE) TIMES DAILY. 270 tablet 1   pregabalin (LYRICA) 75 MG capsule Take 1 capsule by mouth 2 (two) times daily.     tacrolimus (PROTOPIC) 0.1 % ointment Apply topically as directed. Qd to bid to aa lips prn flares 60 g 1   Testosterone Enanthate (XYOSTED) 75 MG/0.5ML SOAJ Inject 75 mg weekly into fatty part of stomach 1.96 mL 3   Vitamin D, Ergocalciferol, (DRISDOL) 1.25 MG (50000 UNIT) CAPS capsule Take 50,000 Units by mouth once a week.     No current facility-administered medications for this visit.    PHYSICAL EXAM: Vitals:   01/30/23 0826  BP: 132/80  Pulse: (!) 106  Resp: 20  SpO2: 97%  Weight: 182 lb 6.4 oz (82.7 kg)  Height: 5' 9.5" (1.765 m)   Body mass index is 26.55 kg/m.  Wt Readings from Last 3 Encounters:  01/30/23 182 lb 6.4 oz (82.7 kg)  01/07/23 197 lb (89.4 kg)  12/03/22 197 lb (89.4 kg)    General: Well developed, well nourished male in no apparent distress.  HEENT: AT/[place], no external lesions. Hearing intact to the spoken word Eyes: Conjunctiva clear and no icterus. Neck: Trachea midline, neck supple  Neurologic: Alert, oriented, normal speech Extremities: No pedal pitting edema Skin: Warm, color good. Dry  skin  PERTINENT HISTORIC LABORATORY AND IMAGING STUDIES:  All pertinent laboratory results were reviewed. Please see HPI also for further details.   TSH  Date Value Ref Range Status  04/12/2021 3.85 0.35 - 5.50 uIU/mL Final  02/14/2021 4.06 0.35 - 5.50 uIU/mL Final  07/20/2020 1.92 0.35 - 4.50 uIU/mL Final     ASSESSMENT / PLAN  1. Hypogonadotropic hypogonadism (HCC)   2. Pseudohypoparathyroidism   3. Hypokalemia   4. Hypothyroidism (acquired)   5. Vitamin D deficiency    # Hypogonadism is likely secondary ?  Idiopathic. -Patient has been taking Xyosted 75 mg weekly. -Will check total testosterone today.  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  Not fully compliant with calcium and vitamin D supplement.  Denies any symptoms.  He has mildly low serum calcium. -Discussed about compliance with calcium and vitamin D intake.  Advised to continue calcitriol 0.5 mcg daily.  Current dose of vitamin D3 intake.  He does not recall the dose of vitamin D3.  Calcium 1200 mg 2 times a day. -Check PTH, renal function panel, magnesium, vitamin D level today.  # Hypothyroidism -Currently taking levothyroxine 75 mcg daily. -Will check thyroid function test.  He has significantly dry skin otherwise clinically euthyroid today.   # Hypokalemia -Continue potassium chloride 8 mEq 1 tablet 3 times a day.  Allergy unclear.  Diagnoses and all orders for this visit:  Hypogonadotropic hypogonadism (HCC) -     Testosterone, Total, LC/MS/MS; Future -     Testosterone, Total, LC/MS/MS  Pseudohypoparathyroidism -     Renal function panel; Future -     Parathyroid hormone, intact (no Ca); Future -     Magnesium; Future -     VITAMIN D 25 Hydroxy (Vit-D Deficiency, Fractures); Future -     VITAMIN D 25 Hydroxy (Vit-D Deficiency, Fractures) -     Magnesium -     Parathyroid hormone, intact (no Ca) -     Renal function panel  Hypokalemia -     Renal function panel; Future -     Renal  function panel  Hypothyroidism (acquired) -     T4, free; Future -  T4, free  Vitamin D deficiency -     VITAMIN D 25 Hydroxy (Vit-D Deficiency, Fractures); Future -     VITAMIN D 25 Hydroxy (Vit-D Deficiency, Fractures)    DISPOSITION Follow up in clinic in 4 months suggested.  All questions answered and patient verbalized understanding of the plan.  [place] Lior Hoen, MD [facility] [address] [place], [address] [place], [place] [address] Phone # [phone]  At least part of this note was generated using voice recognition software. Inadvertent word errors may have occurred, which were not recognized during the proofreading process.

## 2023-02-03 LAB — PARATHYROID HORMONE, INTACT (NO CA)

## 2023-02-03 LAB — TESTOSTERONE, TOTAL, LC/MS/MS: Testosterone, Total, LC-MS-MS: 419 ng/dL (ref 250–1100)

## 2023-02-04 DIAGNOSIS — F3131 Bipolar disorder, current episode depressed, mild: Secondary | ICD-10-CM | POA: Diagnosis not present

## 2023-02-04 NOTE — Addendum Note (Signed)
Addended by: Rasaan Brotherton, [place] on: 02/04/2023 01:15 PM   Modules accepted: Orders

## 2023-02-05 LAB — PARATHYROID HORMONE, INTACT (NO CA)

## 2023-02-05 LAB — SPECIMEN STATUS REPORT

## 2023-02-06 ENCOUNTER — Other Ambulatory Visit: Payer: Self-pay | Admitting: Internal Medicine

## 2023-02-07 ENCOUNTER — Ambulatory Visit: Payer: BC Managed Care – PPO | Admitting: Dermatology

## 2023-02-07 DIAGNOSIS — L700 Acne vulgaris: Secondary | ICD-10-CM | POA: Diagnosis not present

## 2023-02-07 DIAGNOSIS — Z79899 Other long term (current) drug therapy: Secondary | ICD-10-CM

## 2023-02-07 DIAGNOSIS — Z7189 Other specified counseling: Secondary | ICD-10-CM

## 2023-02-07 MED ORDER — ABSORICA LD 32 MG PO CAPS: 2.0000 | ORAL_CAPSULE | Freq: Every day | ORAL | 0 refills | Status: AC

## 2023-02-07 NOTE — Patient Instructions (Signed)

## 2023-02-07 NOTE — Progress Notes (Signed)
Isotretinoin Follow-Up Visit   Subjective  Kevin Ramos is a 27 y.o. male who presents for the following: Isotretinoin follow-up  Week # 32   Isotretinoin F/U - 02/07/23 0800       Isotretinoin Follow Up   iPledge # [id number]    Date 02/07/23      Dosage   Target Dosage (mg) 16540    Current (To Date) Dosage (mg) 14100    To Go Dosage (mg) 2440      Skin Side Effects   Dry Lips Yes    Nose bleeds No    Dry eyes Yes    Dry Skin Yes    Sunburn No      Gastrointestinal Side Effects   Nausea No    Diarrhea No    Blood in stool No      Neurological Side Effects   Blurred vision No    Depression No    Headache No    Homicidal thoughts No    Mood Changes No    Suicidal thoughts No              Side effects: Dry skin, dry lips  The following portions of the chart were reviewed this encounter and updated as appropriate: medications, allergies, medical history  Review of Systems:  No other skin or systemic complaints except as noted in HPI or Assessment and Plan.  Objective  Well appearing patient in no apparent distress; mood and affect are within normal limits.  An examination of the face, neck, chest, and back was performed and relevant findings are noted below.     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 Condition is currently not to goal (must reach target dose based on weight and also have clear skin for 2 months prior to discontinuation in order to help prevent relapse)  Exam findings: 4 crusted areas on upper back and shoulders; 3 crusted areas on the glabella; scarring of the upper back and shoulders.  Week # 32 Pharmacy [facility] #[id number]  Total mg -  14,100 mg Total mg/kg - 170.4 mg/kg  Continue Absorica LD 32 MG take 2 po every day with food dsp #60 0Rf.  Patient confirmed in iPledge and isotretinoin sent to  pharmacy.   Plan 1-2 more months. Patient plans on moving to [place] or [place] sometime after Christmas.    Xerosis secondary to isotretinoin therapy - Continue emollients as directed - Xyzal (levocetirizine) once a day and fish oil 1 gram daily may also help with dryness   Cheilitis secondary to isotretinoin therapy - Continue lip balm as directed, Dr. Clayborne Artist Cortibalm recommended   Long term medication management (isotretinoin).  Patient is using long term (months to years) prescription medication to control their dermatologic condition.  These medications require periodic monitoring to evaluate for efficacy and side effects and may require periodic laboratory monitoring.  - While taking Isotretinoin and for 30 days after you finish the medication, do not share pills, do not donate blood. It is very important that a women who could become pregnant not take this medicine or get a blood transfusion with this medicine in it. Isotretinoin is best absorbed when taken with a fatty meal. Isotretinoin can make you sensitive to the sun. Daily careful sun protection including sunscreen SPF 30+ when outdoors is recommended.  Follow-up in 30 days.  Wendee Beavers, CMA, am acting as  scribe for Armida Sans, MD .   Documentation: I have reviewed the above documentation for accuracy and completeness, and I agree with the above.  Armida Sans, MD

## 2023-02-12 DIAGNOSIS — F3181 Bipolar II disorder: Secondary | ICD-10-CM | POA: Diagnosis not present

## 2023-02-12 DIAGNOSIS — F902 Attention-deficit hyperactivity disorder, combined type: Secondary | ICD-10-CM | POA: Diagnosis not present

## 2023-02-12 DIAGNOSIS — F411 Generalized anxiety disorder: Secondary | ICD-10-CM | POA: Diagnosis not present

## 2023-02-16 ENCOUNTER — Encounter: Payer: Self-pay | Admitting: Dermatology

## 2023-02-20 DIAGNOSIS — Z13220 Encounter for screening for lipoid disorders: Secondary | ICD-10-CM | POA: Diagnosis not present

## 2023-02-20 DIAGNOSIS — G5793 Unspecified mononeuropathy of bilateral lower limbs: Secondary | ICD-10-CM | POA: Diagnosis not present

## 2023-02-20 DIAGNOSIS — Z131 Encounter for screening for diabetes mellitus: Secondary | ICD-10-CM | POA: Diagnosis not present

## 2023-02-20 DIAGNOSIS — E559 Vitamin D deficiency, unspecified: Secondary | ICD-10-CM | POA: Diagnosis not present

## 2023-02-20 DIAGNOSIS — E039 Hypothyroidism, unspecified: Secondary | ICD-10-CM | POA: Diagnosis not present

## 2023-02-20 DIAGNOSIS — E876 Hypokalemia: Secondary | ICD-10-CM | POA: Diagnosis not present

## 2023-02-25 DIAGNOSIS — F3131 Bipolar disorder, current episode depressed, mild: Secondary | ICD-10-CM | POA: Diagnosis not present

## 2023-03-01 DIAGNOSIS — G4733 Obstructive sleep apnea (adult) (pediatric): Secondary | ICD-10-CM | POA: Diagnosis not present

## 2023-03-01 DIAGNOSIS — F419 Anxiety disorder, unspecified: Secondary | ICD-10-CM | POA: Diagnosis not present

## 2023-03-01 DIAGNOSIS — Z0000 Encounter for general adult medical examination without abnormal findings: Secondary | ICD-10-CM | POA: Diagnosis not present

## 2023-03-01 DIAGNOSIS — G5793 Unspecified mononeuropathy of bilateral lower limbs: Secondary | ICD-10-CM | POA: Diagnosis not present

## 2023-03-01 DIAGNOSIS — F32A Depression, unspecified: Secondary | ICD-10-CM | POA: Diagnosis not present

## 2023-03-08 DIAGNOSIS — M9901 Segmental and somatic dysfunction of cervical region: Secondary | ICD-10-CM | POA: Diagnosis not present

## 2023-03-08 DIAGNOSIS — M9903 Segmental and somatic dysfunction of lumbar region: Secondary | ICD-10-CM | POA: Diagnosis not present

## 2023-03-08 DIAGNOSIS — R519 Headache, unspecified: Secondary | ICD-10-CM | POA: Diagnosis not present

## 2023-03-08 DIAGNOSIS — M62830 Muscle spasm of back: Secondary | ICD-10-CM | POA: Diagnosis not present

## 2023-03-13 ENCOUNTER — Other Ambulatory Visit: Payer: Self-pay

## 2023-03-13 ENCOUNTER — Ambulatory Visit: Payer: BC Managed Care – PPO | Admitting: Dermatology

## 2023-03-13 ENCOUNTER — Encounter: Payer: Self-pay | Admitting: Endocrinology

## 2023-03-13 VITALS — Wt 197.0 lb

## 2023-03-13 DIAGNOSIS — E876 Hypokalemia: Secondary | ICD-10-CM

## 2023-03-13 DIAGNOSIS — L853 Xerosis cutis: Secondary | ICD-10-CM | POA: Diagnosis not present

## 2023-03-13 DIAGNOSIS — K130 Diseases of lips: Secondary | ICD-10-CM

## 2023-03-13 DIAGNOSIS — Z7189 Other specified counseling: Secondary | ICD-10-CM

## 2023-03-13 DIAGNOSIS — L700 Acne vulgaris: Secondary | ICD-10-CM

## 2023-03-13 DIAGNOSIS — Z79899 Other long term (current) drug therapy: Secondary | ICD-10-CM

## 2023-03-13 NOTE — Patient Instructions (Signed)

## 2023-03-13 NOTE — Progress Notes (Signed)
Isotretinoin Follow-Up Visit   Subjective  Kevin Ramos is a 27 y.o. male who presents for the following: Isotretinoin follow-up  Week # 36   Isotretinoin F/U - 03/13/23 0800       Isotretinoin Follow Up   iPledge # [id number]    Date 03/13/23    Weight 197 lb (89.4 kg)    Acne breakouts since last visit? No      Dosage   Target Dosage (mg) 16540    Current (To Date) Dosage (mg) 16500    To Go Dosage (mg) 40      Skin Side Effects   Dry Lips Yes    Nose bleeds No    Dry eyes Yes    Dry Skin Yes    Sunburn No      Gastrointestinal Side Effects   Nausea No    Diarrhea No    Blood in stool No      Neurological Side Effects   Blurred vision No    Depression Yes    Headache No    Homicidal thoughts No    Mood Changes Yes    Suicidal thoughts No      Constitutional Side Effects   Fatigue No      Musculoskeletal Side Effects   Muscle aches No              Side effects: Dry skin, dry lips  The following portions of the chart were reviewed this encounter and updated as appropriate: medications, allergies, medical history  Review of Systems:  No other skin or systemic complaints except as noted in HPI or Assessment and Plan.  Objective  Well appearing patient in no apparent distress; mood and affect are within normal limits.  An examination of the face, neck, chest, and back was performed and relevant findings are noted below.     Assessment & Plan   ACNE VULGARIS   MEDICATION MANAGEMENT   COUNSELING AND COORDINATION OF CARE   LONG-TERM USE OF HIGH-RISK MEDICATION    ACNE VULGARIS Patient is currently on Isotretinoin requiring FDA mandated monthly evaluations and laboratory monitoring. Condition is currently not to goal (must reach target dose based on weight and also have clear skin for 2 months prior to discontinuation in order to help prevent relapse)  Exam findings: One active papule of the R shoulder. Scarring. Otherwise  clear.   Week # 36 Pharmacy [facility] #[id number] Total mg -  16,500 Total mg/kg - 199.5  Continue isotretinoin but take one week off then start Absorica LD 32 po QD until all medication is finished.   Xerosis secondary to isotretinoin therapy - Continue emollients as directed - Xyzal (levocetirizine) once a day and fish oil 1 gram daily may also help with dryness - samples given of OTC moisturizers   Cheilitis secondary to isotretinoin therapy - Continue lip balm as directed, Dr. Clayborne Artist Cortibalm recommended   Long term medication management (isotretinoin).  Patient is using long term (months to years) prescription medication to control their dermatologic condition.  These medications require periodic monitoring to evaluate for efficacy and side effects and may require periodic laboratory monitoring.  - While taking Isotretinoin and for 30 days after you finish the medication, do not share pills, do not donate blood. It is very important that a women who could become pregnant not take this medicine or get a blood transfusion with this medicine in it. Isotretinoin is best absorbed when taken with a fatty meal. Isotretinoin can  make you sensitive to the sun. Daily careful sun protection including sunscreen SPF 30+ when outdoors is recommended.  Follow-up in 6 mos  I, Cari Caraway, CMA, am acting as scribe for Armida Sans, MD .   Documentation: I have reviewed the above documentation for accuracy and completeness, and I agree with the above.  Armida Sans, MD

## 2023-03-15 DIAGNOSIS — F3181 Bipolar II disorder: Secondary | ICD-10-CM | POA: Diagnosis not present

## 2023-03-15 DIAGNOSIS — M9901 Segmental and somatic dysfunction of cervical region: Secondary | ICD-10-CM | POA: Diagnosis not present

## 2023-03-15 DIAGNOSIS — E876 Hypokalemia: Secondary | ICD-10-CM | POA: Diagnosis not present

## 2023-03-15 DIAGNOSIS — F902 Attention-deficit hyperactivity disorder, combined type: Secondary | ICD-10-CM | POA: Diagnosis not present

## 2023-03-15 DIAGNOSIS — F411 Generalized anxiety disorder: Secondary | ICD-10-CM | POA: Diagnosis not present

## 2023-03-15 DIAGNOSIS — M62830 Muscle spasm of back: Secondary | ICD-10-CM | POA: Diagnosis not present

## 2023-03-15 DIAGNOSIS — R519 Headache, unspecified: Secondary | ICD-10-CM | POA: Diagnosis not present

## 2023-03-15 DIAGNOSIS — M9903 Segmental and somatic dysfunction of lumbar region: Secondary | ICD-10-CM | POA: Diagnosis not present

## 2023-03-16 LAB — RENAL FUNCTION PANEL
Albumin: 4.5 g/dL (ref 4.3–5.2)
BUN/Creatinine Ratio: 10 (ref 9–20)
BUN: 9 mg/dL (ref 6–20)
CO2: 24 mmol/L (ref 20–29)
Calcium: 7.3 mg/dL — ABNORMAL LOW (ref 8.7–10.2)
Chloride: 96 mmol/L (ref 96–106)
Creatinine, Ser: 0.91 mg/dL (ref 0.76–1.27)
Glucose: 82 mg/dL (ref 70–99)
Phosphorus: 3.9 mg/dL (ref 2.8–4.1)
Potassium: 3.3 mmol/L — ABNORMAL LOW (ref 3.5–5.2)
Sodium: 139 mmol/L (ref 134–144)
eGFR: 117 mL/min/{1.73_m2} (ref >59)

## 2023-03-18 ENCOUNTER — Encounter: Payer: Self-pay | Admitting: Endocrinology

## 2023-03-21 ENCOUNTER — Other Ambulatory Visit: Payer: Self-pay

## 2023-03-21 ENCOUNTER — Telehealth: Payer: Self-pay

## 2023-03-21 DIAGNOSIS — E559 Vitamin D deficiency, unspecified: Secondary | ICD-10-CM

## 2023-03-21 MED ORDER — CALCITRIOL 0.5 MCG PO CAPS: ORAL_CAPSULE | ORAL | 1 refills | Status: DC

## 2023-03-21 NOTE — Telephone Encounter (Signed)
Patient given results and medication changes as directed by MD. No further questions at this time. Calcitrol refill complete

## 2023-03-21 NOTE — Telephone Encounter (Signed)
-----   Message from [place] Thapa sent at 03/19/2023  6:41 PM EST ----- Please notify patient of low serum calcium.  Please confirm the compliance with calcitriol, vitamin D and calcium supplement.  If he is noncompliant please reiterate for compliance.

## 2023-03-22 DIAGNOSIS — M9903 Segmental and somatic dysfunction of lumbar region: Secondary | ICD-10-CM | POA: Diagnosis not present

## 2023-03-22 DIAGNOSIS — R519 Headache, unspecified: Secondary | ICD-10-CM | POA: Diagnosis not present

## 2023-03-22 DIAGNOSIS — M62830 Muscle spasm of back: Secondary | ICD-10-CM | POA: Diagnosis not present

## 2023-03-22 DIAGNOSIS — M9901 Segmental and somatic dysfunction of cervical region: Secondary | ICD-10-CM | POA: Diagnosis not present

## 2023-03-23 ENCOUNTER — Encounter: Payer: Self-pay | Admitting: Dermatology

## 2023-03-24 ENCOUNTER — Other Ambulatory Visit: Payer: Self-pay | Admitting: Medical Genetics

## 2023-03-25 ENCOUNTER — Other Ambulatory Visit
Admission: RE | Admit: 2023-03-25 | Discharge: 2023-03-25 | Disposition: A | Discharge status: Home / Self Care | Payer: Self-pay | Source: Ambulatory Visit | Attending: Medical Genetics | Admitting: Medical Genetics

## 2023-03-25 DIAGNOSIS — F3131 Bipolar disorder, current episode depressed, mild: Secondary | ICD-10-CM | POA: Diagnosis not present

## 2023-03-29 DIAGNOSIS — R519 Headache, unspecified: Secondary | ICD-10-CM | POA: Diagnosis not present

## 2023-03-29 DIAGNOSIS — M9903 Segmental and somatic dysfunction of lumbar region: Secondary | ICD-10-CM | POA: Diagnosis not present

## 2023-03-29 DIAGNOSIS — M9901 Segmental and somatic dysfunction of cervical region: Secondary | ICD-10-CM | POA: Diagnosis not present

## 2023-03-29 DIAGNOSIS — M62830 Muscle spasm of back: Secondary | ICD-10-CM | POA: Diagnosis not present

## 2023-04-02 DIAGNOSIS — R7989 Other specified abnormal findings of blood chemistry: Secondary | ICD-10-CM | POA: Diagnosis not present

## 2023-04-05 DIAGNOSIS — R7989 Other specified abnormal findings of blood chemistry: Secondary | ICD-10-CM | POA: Diagnosis not present

## 2023-04-06 LAB — GENECONNECT MOLECULAR SCREEN: Genetic Analysis Overall Interpretation: NEGATIVE

## 2023-04-09 ENCOUNTER — Telehealth: Payer: Self-pay

## 2023-04-10 ENCOUNTER — Other Ambulatory Visit (HOSPITAL_COMMUNITY): Payer: Self-pay

## 2023-04-10 ENCOUNTER — Telehealth: Payer: Self-pay

## 2023-04-10 NOTE — Telephone Encounter (Signed)
 Pharmacy Patient Advocate Encounter   Received notification from Pt Calls Messages that prior authorization for Calcitriol  is required/requested.   Insurance verification completed.   The patient is insured through [employer] .   Per test claim: PA required; PA submitted to above mentioned insurance via CoverMyMeds Key/confirmation #/EOC [id number] Status is pending

## 2023-04-10 NOTE — Telephone Encounter (Signed)
 PA sent to PA team.

## 2023-04-15 DIAGNOSIS — F3131 Bipolar disorder, current episode depressed, mild: Secondary | ICD-10-CM | POA: Diagnosis not present

## 2023-04-16 NOTE — Telephone Encounter (Signed)
Pharmacy Patient Advocate Encounter  Received notification from [facility] that Prior Authorization for Calcitriol has been APPROVED through 04/08/2024   PA #/Case ID/Reference #: [id number]

## 2023-04-19 ENCOUNTER — Encounter: Payer: Self-pay | Admitting: Endocrinology

## 2023-04-22 NOTE — Telephone Encounter (Signed)
That new medication is Dorna Bloom, it is injectable medicine daily, not weekly.  If you are interested and you feel like you can take every day injection, we can plan for this injectable medicine.  The insurance coverage we do not know at this time that is something we have to check, there is some process involved with this.  [place] Prabhleen Montemayor, MD [facility] Endocrinology [facility] [address] [place], [address] [place], [place] [address] Phone # [phone]

## 2023-04-23 ENCOUNTER — Telehealth: Payer: Self-pay

## 2023-04-23 NOTE — Telephone Encounter (Signed)
Pt LM on triage line questioning if he should be prepared to provide a urine specimen at his appointment tomorrow.   Called pt back no answer. LM informing pt that he will need to provide UA.

## 2023-04-23 NOTE — Telephone Encounter (Signed)
We may have form to complete for [employer], please check if we have one? we can complete the form and fax to the company.  [place] Rafay Dahan, MD [facility] Endocrinology [facility] [address] [place], [address] [place], [place] [address] Phone # [phone]

## 2023-04-24 ENCOUNTER — Ambulatory Visit: Payer: BC Managed Care – PPO | Admitting: Urology

## 2023-04-24 ENCOUNTER — Encounter: Payer: Self-pay | Admitting: Urology

## 2023-04-24 VITALS — BP 117/77 | HR 98 | Ht 69.0 in | Wt 180.0 lb

## 2023-04-24 DIAGNOSIS — N529 Male erectile dysfunction, unspecified: Secondary | ICD-10-CM | POA: Diagnosis not present

## 2023-04-24 DIAGNOSIS — R399 Unspecified symptoms and signs involving the genitourinary system: Secondary | ICD-10-CM | POA: Diagnosis not present

## 2023-04-24 DIAGNOSIS — N4889 Other specified disorders of penis: Secondary | ICD-10-CM | POA: Diagnosis not present

## 2023-04-24 LAB — URINALYSIS, COMPLETE
Bilirubin, UA: NEGATIVE
Glucose, UA: NEGATIVE
Ketones, UA: NEGATIVE
Leukocytes,UA: NEGATIVE
Nitrite, UA: NEGATIVE
RBC, UA: NEGATIVE
Specific Gravity, UA: 1.02 (ref 1.005–1.030)
Urobilinogen, Ur: 0.2 mg/dL (ref 0.2–1.0)
pH, UA: 6.5 (ref 5.0–7.5)

## 2023-04-24 LAB — MICROSCOPIC EXAMINATION

## 2023-04-24 LAB — BLADDER SCAN AMB NON-IMAGING: Scan Result: 0

## 2023-04-24 MED ORDER — ALFUZOSIN HCL ER 10 MG PO TB24: 10.0000 mg | ORAL_TABLET | Freq: Every day | ORAL | 2 refills | Status: DC

## 2023-04-24 MED ORDER — TADALAFIL 20 MG PO TABS: ORAL_TABLET | ORAL | 0 refills | Status: AC

## 2023-04-24 NOTE — Progress Notes (Signed)
I, Maysun Anabel Bene, acting as a scribe for Riki Altes, MD., have documented all relevant documentation on the behalf of Riki Altes, MD, as directed by Riki Altes, MD while in the presence of Riki Altes, MD.  04/24/2023 11:25 AM   Kevin Ramos February 27, 1996 [id number]  Referring provider: Marisue Ivan, MD [phone] [facility] [address] [facility] [place],  [place] [address]  Chief Complaint  Patient presents with   Other    HPI: Kevin Ramos is a 28 y.o. male referred for evaluation of "penile abnormality".   1 month ago, he was having a split urinary stream and urinary hesitancy. He mentioned this to his provider and urology evaluation was recommended. In the last month, his voiding symptoms have improved and he does have some urinary hesitancy and decreased force and caliber of his urinary stream, which he states has been present for several years. No dysuria or gross hematuria. Denies flank, abdominal, or pelvic pain.  He also wanted to discuss erectile dysfunction. He has had a long history of difficulty maintaining an erection. He is on both Cymbalta and Prozac. He states he has not been sexually active in 4 years and thinks these medications may have an impact on his erectile dysfunction. He has difficulty ejaculating with a partner, but has no difficulty when masturbating   PMH: Past Medical History:  Diagnosis Date   Acne    Depression    Dysplastic nevus 02/09/2019   right upper back, moderate atypia, close to margin.   Dysplastic nevus 05/24/2022   Left inferior side above waistline. Moderate atypia, closed to but does not involve margin.   Home Medications:  Allergies as of 04/24/2023       Reactions   Abilify [aripiprazole] Nausea Only   Effexor [venlafaxine] Nausea Only        Medication List        Accurate as of April 24, 2023 11:25 AM. If you have any questions, ask your nurse or doctor.          STOP  taking these medications    cyanocobalamin 1000 MCG tablet Stopped by: Riki Altes   emtricitabine-tenofovir 200-300 MG tablet Commonly known as: TRUVADA Stopped by: Riki Altes       TAKE these medications    Absorica LD 32 MG Caps Generic drug: ISOtretinoin Micronized Take 2 capsules by mouth daily.   acetaminophen 500 MG tablet Commonly known as: TYLENOL Take 500 mg by mouth every 6 (six) hours as needed.   alclomethasone 0.05 % ointment Commonly known as: ACLOVATE Apply topically to lips qd/bid prn for dryness   alfuzosin 10 MG 24 hr tablet Commonly known as: UROXATRAL Take 1 tablet (10 mg total) by mouth daily with breakfast. Started by: Riki Altes   amphetamine-dextroamphetamine 5 MG tablet Commonly known as: Adderall Take 1 tablet (5 mg total) by mouth daily.   asenapine 5 MG Subl 24 hr tablet Commonly known as: SAPHRIS Place under the tongue.   azelastine 0.1 % nasal spray Commonly known as: ASTELIN Place into the nose.   calcitRIOL 0.5 MCG capsule Commonly known as: ROCALTROL TAKE 1 CAPSULE BY MOUTH DAILY   CALCIUM 1200 PO Take 2 capsules by mouth daily.   calcium carbonate 1500 (600 Ca) MG Tabs tablet Commonly known as: OSCAL Take by mouth.   cetirizine 10 MG tablet Commonly known as: ZYRTEC Take by mouth.   DULoxetine 30 MG capsule Commonly known as:  Cymbalta Take 2 capsules in the morning and 1 capsule in the evening   ferrous sulfate 325 (65 FE) MG EC tablet Take 1 tablet by mouth every other day.   FLUoxetine 10 MG tablet Commonly known as: PROZAC Take 10 mg by mouth daily.   fluticasone 50 MCG/ACT nasal spray Commonly known as: FLONASE Place into the nose.   ketoconazole 2 % cream Commonly known as: NIZORAL Apply to groin QD.   ketoconazole 2 % shampoo Commonly known as: NIZORAL Apply 1 Application topically as directed. Shampoo scalp 3 times per week   levothyroxine 75 MCG tablet Commonly known as:  SYNTHROID TAKE ONE TABLET BY MOUTH EVERY MORNING BEFORE BREAKFAST   lidocaine 5 % Commonly known as: LIDODERM 1 patch daily.   meloxicam 15 MG tablet Commonly known as: MOBIC TAKE 1 TABLET BY MOUTH EVERY DAY   mupirocin ointment 2 % Commonly known as: BACTROBAN Apply to aa TID x 2 weeks.   nystatin 100000 UNIT/ML suspension Commonly known as: MYCOSTATIN Take by mouth.   potassium chloride 8 MEQ tablet Commonly known as: KLOR-CON TAKE 1 TABLET (8 MEQ TOTAL) BY MOUTH 3 (THREE) TIMES DAILY.   pregabalin 75 MG capsule Commonly known as: LYRICA Take 1 capsule by mouth 2 (two) times daily.   tacrolimus 0.1 % ointment Commonly known as: PROTOPIC Apply topically as directed. Qd to bid to aa lips prn flares   tadalafil 20 MG tablet Commonly known as: CIALIS Take 1 tab 1 hour prior to intercourse Started by: Riki Altes   Vitamin D (Ergocalciferol) 1.25 MG (50000 UNIT) Caps capsule Commonly known as: DRISDOL Take 50,000 Units by mouth once a week.   Xyosted 75 MG/0.5ML Soaj Generic drug: Testosterone Enanthate INJECT 75 MG WEEKLY INTO FATTY PART OF STOMACH        Allergies:  Allergies  Allergen Reactions   Abilify [Aripiprazole] Nausea Only   Effexor [Venlafaxine] Nausea Only    Family History: Family History  Problem Relation Age of Onset   Hypoparathyroidism Mother     Social History:  reports that he has never smoked. He has never used smokeless tobacco. He reports that he does not drink alcohol and does not use drugs.   Physical Exam: BP 117/77   Pulse 98   Ht 5\' 9"  (1.753 m)   Wt 180 lb (81.6 kg)   BMI 26.58 kg/m   Constitutional:  Alert and oriented, No acute distress. HEENT: [place] AT Respiratory: Normal respiratory effort, no increased work of breathing. GU: Phallus circumcised without lesions. Meatus normal without stricturing. Testes descended bilateral without masses or tenderness.  Psychiatric: Normal mood and  affect.   Urinalysis Dipstick/microscopy negative    Assessment & Plan:    1. Lower urinary tract symptoms We discussed the most common etiology would be pelvic floor tightness or increased adrenergic tone of the prostatic urethra. Urethral stricture was discussed but less common Recommend trial of alfuzosin 10 mg daily  PVR today 0 mL   2. Erectile dysfunction His difficulty maintaining an erection may certainly be related to his Cymbalta/Prozac. He was interested in a trial of a PDE5 inhibitor and Rx tadalafil 20 mg sent to pharmacy. We discussed it did take 30-60 minutes prior to anticipated sexual activity, and it does require foreplay or stimulation for efficacy.  We discussed his inability to ejaculate with a partner, but no problems alone would be psychogenic.   I have reviewed the above documentation for accuracy and completeness, and I agree with  the above.   Riki Altes, MD  [facility] [address] [place], [place] [address] [phone]

## 2023-04-26 ENCOUNTER — Encounter: Payer: Self-pay | Admitting: Urology

## 2023-04-29 DIAGNOSIS — F3131 Bipolar disorder, current episode depressed, mild: Secondary | ICD-10-CM | POA: Diagnosis not present

## 2023-05-08 ENCOUNTER — Encounter: Payer: Self-pay | Admitting: Urology

## 2023-05-13 DIAGNOSIS — F3131 Bipolar disorder, current episode depressed, mild: Secondary | ICD-10-CM | POA: Diagnosis not present

## 2023-05-23 ENCOUNTER — Telehealth: Payer: Self-pay | Admitting: Endocrinology

## 2023-05-23 NOTE — Telephone Encounter (Signed)
 Received call from [employer], nurse [place], regarding to inform that this medicine require prior authorization.  Please initiate prior authorization, phone number to call [phone]. Fax # [phone]  Contact number for Meriam Sprague who is a nurse advocate from the company : [phone]  Iraq Danyon Mcginness, MD [facility] Endocrinology [facility] [address] [place], [address] [place], [place] [address] Phone # [phone]

## 2023-05-24 ENCOUNTER — Other Ambulatory Visit (HOSPITAL_COMMUNITY): Payer: Self-pay

## 2023-05-24 ENCOUNTER — Encounter: Payer: Self-pay | Admitting: Endocrinology

## 2023-05-24 DIAGNOSIS — E559 Vitamin D deficiency, unspecified: Secondary | ICD-10-CM

## 2023-05-24 DIAGNOSIS — E876 Hypokalemia: Secondary | ICD-10-CM

## 2023-05-24 DIAGNOSIS — E201 Pseudohypoparathyroidism: Secondary | ICD-10-CM

## 2023-05-27 ENCOUNTER — Other Ambulatory Visit: Payer: Self-pay

## 2023-05-27 NOTE — Telephone Encounter (Signed)
 Please arrange to check for BMP with EGFR, albumin, vitamin D.

## 2023-05-28 ENCOUNTER — Other Ambulatory Visit: Payer: Self-pay | Admitting: Endocrinology

## 2023-05-28 ENCOUNTER — Other Ambulatory Visit: Payer: Self-pay

## 2023-05-28 ENCOUNTER — Telehealth: Payer: Self-pay

## 2023-05-28 DIAGNOSIS — E201 Pseudohypoparathyroidism: Secondary | ICD-10-CM | POA: Diagnosis not present

## 2023-05-28 DIAGNOSIS — E559 Vitamin D deficiency, unspecified: Secondary | ICD-10-CM | POA: Diagnosis not present

## 2023-05-28 DIAGNOSIS — E876 Hypokalemia: Secondary | ICD-10-CM | POA: Diagnosis not present

## 2023-05-28 NOTE — Telephone Encounter (Signed)
 It is of vitamin D 25 hydroxy.

## 2023-05-28 NOTE — Telephone Encounter (Signed)
 Patient requested labs be sent to [facility]. Lab orders sent and patient made aware via phone.

## 2023-05-29 ENCOUNTER — Encounter: Payer: Self-pay | Admitting: Endocrinology

## 2023-05-29 LAB — BASIC METABOLIC PANEL
BUN/Creatinine Ratio: 9 (ref 9–20)
BUN: 8 mg/dL (ref 6–20)
CO2: 24 mmol/L (ref 20–29)
Calcium: 7.7 mg/dL — ABNORMAL LOW (ref 8.7–10.2)
Chloride: 96 mmol/L (ref 96–106)
Creatinine, Ser: 0.87 mg/dL (ref 0.76–1.27)
Glucose: 91 mg/dL (ref 70–99)
Potassium: 3.4 mmol/L — ABNORMAL LOW (ref 3.5–5.2)
Sodium: 140 mmol/L (ref 134–144)
eGFR: 120 mL/min/{1.73_m2} (ref >59)

## 2023-05-29 LAB — VITAMIN D 25 HYDROXY (VIT D DEFICIENCY, FRACTURES): Vit D, 25-Hydroxy: 25.5 ng/mL — ABNORMAL LOW (ref 30.0–100.0)

## 2023-05-29 LAB — ALBUMIN: Albumin: 4.6 g/dL (ref 4.3–5.2)

## 2023-05-30 ENCOUNTER — Encounter: Payer: Self-pay | Admitting: Endocrinology

## 2023-05-30 ENCOUNTER — Other Ambulatory Visit (HOSPITAL_COMMUNITY): Payer: Self-pay

## 2023-05-30 ENCOUNTER — Telehealth: Payer: Self-pay | Admitting: Pharmacy Technician

## 2023-05-30 ENCOUNTER — Ambulatory Visit: Payer: BC Managed Care – PPO | Admitting: Endocrinology

## 2023-05-30 VITALS — BP 122/70 | HR 72 | Ht 69.0 in | Wt 178.0 lb

## 2023-05-30 DIAGNOSIS — E230 Hypopituitarism: Secondary | ICD-10-CM | POA: Diagnosis not present

## 2023-05-30 DIAGNOSIS — E201 Pseudohypoparathyroidism: Secondary | ICD-10-CM

## 2023-05-30 DIAGNOSIS — E559 Vitamin D deficiency, unspecified: Secondary | ICD-10-CM

## 2023-05-30 DIAGNOSIS — E876 Hypokalemia: Secondary | ICD-10-CM

## 2023-05-30 DIAGNOSIS — E039 Hypothyroidism, unspecified: Secondary | ICD-10-CM

## 2023-05-30 MED ORDER — POTASSIUM CHLORIDE ER 8 MEQ PO TBCR
8.0000 meq | EXTENDED_RELEASE_TABLET | Freq: Three times a day (TID) | ORAL | 3 refills | Status: DC
Start: 2023-05-30 — End: 2023-09-18

## 2023-05-30 MED ORDER — CALCITRIOL 0.5 MCG PO CAPS: 1.0000 ug | ORAL_CAPSULE | Freq: Every day | ORAL | 3 refills | Status: DC

## 2023-05-30 NOTE — Patient Instructions (Addendum)
 Increase calcitriol to 1 mcg daily.  Take calcium carbonate 2 tab tow times a day. Start Vit D3 over the counter 2000 international units daily.   Check lab in 2 weeks.

## 2023-05-30 NOTE — Telephone Encounter (Signed)
 Pharmacy Patient Advocate Encounter   Received notification from Pt Calls Messages that prior authorization for Yorvipath 294MCG/0.98ML pen-injectors is required/requested.   Insurance verification completed.   The patient is insured through [employer] .   Per test claim: PA required; PA started via CoverMyMeds. KEY [id number] . Please see clinical question(s) below that I am not finding the answer to in her chart and advise.   Does the individual have an albumin-corrected serum calcium level of at least 7.8 milligrams per deciliter using calcium and active vitamin D treatment prior to initiation of therapy, with documentation provided? NOTE: Physical documentation must be provided.*  I see that the note from today says he hasn't been taking his vitamin D. Can you confirm if this was a short time frame, like this week, or if he's been off of it for a while? In order to answer yes to the above question about the albumin-corrected serum calcium level, the lab will have to have been done while he was taking the Vit D.

## 2023-05-30 NOTE — Progress Notes (Signed)
 Outpatient Endocrinology Note [place] Thena Devora, MD  05/30/23  Patient's Name: Kevin Ramos    DOB: 1996-03-24    MRN: [id number]  REASON OF VISIT: Follow-up for hypothyroidism / hypocalcemia / hypoparathyroidism / hypogonadism  PCP: Marisue Ivan, MD  HISTORY OF PRESENT ILLNESS:   Kevin Ramos is a 28 y.o. old male with past medical history as listed below is presented for a follow up of hypoparathyroidism / hypocalcemia / hypothyroidism / hypogonadism.  Patient was last seen by Dr. Lucianne Muss in July 2024.  Pertinent History: Patient was previously seen by Dr. Lucianne Muss and was last time seen in July 2024.  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  Mother has pseudohypoparathyroidism as well. PTH was 760 in 09/2021 ( at [facility]).  -Currently on calcitriol 0.5 mcg daily.  Calcium 1200 mg 2 tablets 1-2 times a day.  Vitamin D3 ?  2000 international unit daily.  # Acquired hypothyroidism :  -Patient was diagnosed with hypothyroidism in December 2020 when TSH was mildly elevated 5.14, he was restarted on levothyroxine 25 mcg daily and possibly adjusted over time.  He is currently taking levothyroxine 75 mcg daily.  There was question of ? secondary hypothyroidism as well based on prior endocrinology office note  # Hypogonadism -Due to fatigue, low motivation, testosterone levels were checked and found to have low free testosterone on 2 occasions in February 2021, and normal LH and prolactin.  Continue with started on testosterone therapy.  Previously he was on Androderm 4 mg, which was switched to AndroGel.  He had taken Jatenzo in the past as well.  However even with 237 mcg dose 2 times a day he continued to have low testosterone with hide normal hemoglobin.  He had taken the test to what he thought because of  recurrent sinus infection.  He is currently taking Xyosted 75 mg weekly since May 2024.  With review of medical record in care everywhere he has also been following at [facility] endocrinology for hypogonadism.   Interval history  Patient reports compliance with calcitriol 0.5 mcg daily, he reports he has been taking calcium carbonate 4 tablets in the morning, has not been taking vitamin D3 at this time.  He complains of occasional numbness of the extremities.  Recent lab results with low serum calcium of 7.7 and low vitamin D level slightly improved from 16 to 25.  Serum potassium is mildly low 3.4.  Renal function normal.  Been taking levothyroxine and Xyosted injection.  Dorna Bloom is planned and ordered, waiting for approval.   REVIEW OF SYSTEMS:  As per history of present illness.   PAST MEDICAL HISTORY: Past Medical History:  Diagnosis Date   Acne    Depression    Dysplastic nevus 02/09/2019   right upper back, moderate atypia, close to margin.   Dysplastic nevus 05/24/2022   Left inferior side above waistline. Moderate atypia, closed to but does not involve margin.    PAST SURGICAL HISTORY: History reviewed. No pertinent surgical history.  ALLERGIES: Allergies  Allergen Reactions   Abilify [Aripiprazole] Nausea Only   Effexor [Venlafaxine] Nausea Only    FAMILY HISTORY:  Family History  Problem Relation Age of Onset   Hypoparathyroidism Mother     SOCIAL HISTORY: Social History   Socioeconomic History   Marital status: Single    Spouse name: Not on file   Number of  children: Not on file   Years of education: Not on file   Highest education level: Not on file  Occupational History   Not on file  Tobacco Use   Smoking status: Never   Smokeless tobacco: Never  Substance and Sexual Activity   Alcohol use: Never   Drug use: No   Sexual activity: Not on file  Other Topics Concern   Not on file  Social History Narrative   Not on file   Social Drivers of  Health   Financial Resource Strain: Low Risk  (03/01/2023)   Received from [facility]   Overall Financial Resource Strain (CARDIA)    Difficulty of Paying Living Expenses: Not hard at all  Food Insecurity: No Food Insecurity (03/01/2023)   Received from [facility]   Hunger Vital Sign    Worried About Running Out of Food in the Last Year: Never true    Ran Out of Food in the Last Year: Never true  Transportation Needs: No Transportation Needs (03/01/2023)   Received from [facility] - Transportation    In the past 12 months, has lack of transportation kept you from medical appointments or from getting medications?: No    Lack of Transportation (Non-Medical): No  Physical Activity: Not on file  Stress: Not on file  Social Connections: Not on file    MEDICATIONS:  Current Outpatient Medications  Medication Sig Dispense Refill   acetaminophen (TYLENOL) 500 MG tablet Take 500 mg by mouth every 6 (six) hours as needed.     alclomethasone (ACLOVATE) 0.05 % ointment Apply topically to lips qd/bid prn for dryness 30 g 1   alfuzosin (UROXATRAL) 10 MG 24 hr tablet Take 1 tablet (10 mg total) by mouth daily with breakfast. 30 tablet 2   amphetamine-dextroamphetamine (ADDERALL) 5 MG tablet Take 1 tablet (5 mg total) by mouth daily. 30 tablet 0   asenapine (SAPHRIS) 5 MG SUBL 24 hr tablet Place under the tongue.     azelastine (ASTELIN) 0.1 % nasal spray Place into the nose.     calcium carbonate (OSCAL) 1500 (600 Ca) MG TABS tablet Take by mouth.     Calcium Carbonate-Vit D-Min (CALCIUM 1200 PO) Take 2 capsules by mouth daily.      cetirizine (ZYRTEC) 10 MG tablet Take by mouth.     DULoxetine (CYMBALTA) 30 MG capsule Take 2 capsules in the morning and 1 capsule in the evening 90 capsule 1   emtricitabine-tenofovir (TRUVADA) 200-300 MG tablet Take 1 tablet by mouth daily.     ferrous sulfate 325 (65 FE) MG EC tablet Take 1 tablet by  mouth every other day.     FLUoxetine (PROZAC) 40 MG capsule Take 40 mg by mouth every morning.     fluticasone (FLONASE) 50 MCG/ACT nasal spray Place into the nose.     folic acid (FOLVITE) 1 MG tablet Take 1 mg by mouth daily.     ISOtretinoin Micronized (ABSORICA LD) 32 MG CAPS Take 2 capsules by mouth daily. 60 capsule 0   ketoconazole (NIZORAL) 2 % cream Apply to groin QD. 60 g 0   ketoconazole (NIZORAL) 2 % shampoo Apply 1 Application topically as directed. Shampoo scalp 3 times per week 120 mL 11   levothyroxine (SYNTHROID) 88 MCG tablet Take by mouth.     lidocaine (LIDODERM) 5 % 1 patch daily.     meloxicam (MOBIC) 15 MG tablet TAKE 1 TABLET BY MOUTH  EVERY DAY 30 tablet 0   mupirocin ointment (BACTROBAN) 2 % Apply to aa TID x 2 weeks. 22 g 0   nystatin (MYCOSTATIN) 100000 UNIT/ML suspension Take by mouth.     pregabalin (LYRICA) 75 MG capsule Take 1 capsule by mouth 2 (two) times daily.     tacrolimus (PROTOPIC) 0.1 % ointment Apply topically as directed. Qd to bid to aa lips prn flares 60 g 1   tadalafil (CIALIS) 20 MG tablet Take 1 tab 1 hour prior to intercourse 10 tablet 0   Vitamin D, Ergocalciferol, (DRISDOL) 1.25 MG (50000 UNIT) CAPS capsule Take 50,000 Units by mouth once a week.     XYOSTED 100 MG/0.5ML SOAJ INJECT 0.5 MLS (100 MG TOTAL) SUBCUTANEOUSLY EVERY 7 (SEVEN) DAYS     calcitRIOL (ROCALTROL) 0.5 MCG capsule Take 2 capsules (1 mcg total) by mouth daily. 180 capsule 3   potassium chloride (KLOR-CON) 8 MEQ tablet Take 1 tablet (8 mEq total) by mouth 3 (three) times daily. 270 tablet 3   No current facility-administered medications for this visit.    PHYSICAL EXAM: Vitals:   05/30/23 0815  BP: 122/70  Pulse: 72  SpO2: 98%  Weight: 178 lb (80.7 kg)  Height: 5\' 9"  (1.753 m)   Body mass index is 26.29 kg/m.  Wt Readings from Last 3 Encounters:  05/30/23 178 lb (80.7 kg)  04/24/23 180 lb (81.6 kg)  03/13/23 197 lb (89.4 kg)    General: Well developed, well  nourished male in no apparent distress.  HEENT: AT/[place], no external lesions. Hearing intact to the spoken word Eyes: Conjunctiva clear and no icterus. Neck: Trachea midline, neck supple  Neurologic: Alert, oriented, normal speech Extremities: No pedal pitting edema Skin: Warm, color good. Dry skin  PERTINENT HISTORIC LABORATORY AND IMAGING STUDIES:  All pertinent laboratory results were reviewed. Please see HPI also for further details.   TSH  Date Value Ref Range Status  04/12/2021 3.85 0.35 - 5.50 uIU/mL Final  02/14/2021 4.06 0.35 - 5.50 uIU/mL Final  07/20/2020 1.92 0.35 - 4.50 uIU/mL Final     ASSESSMENT / PLAN  1. Pseudohypoparathyroidism   2. Vitamin D deficiency   3. Hypokalemia   4. Hypogonadotropic hypogonadism (HCC)   5. Hypothyroidism (acquired)     # Pseudohypoparathyroidism -He has hypocalcemia.  Historically not fully compliant with calcium and vitamin D supplement.  He has mildly low serum calcium. -Discussed about compliance with calcium and vitamin D intake.  Recent serum calcium mildly low.  He reports compliance with calcitriol, calcium at least for last 5 weeks.  Plan: -Increase calcitriol from0.5 mcg to 1 mcg daily.  -Advised to take calcium carbonate 1200 mg 2 tablets 2 times a day.  Currently he has been taking 4 tablets together in the morning.  -Advised to take vitamin D3 over-the-counter 2000 international unit daily. Dorna Bloom daily injectable medication, is planned and ordered, waiting for approval.  # Hypogonadism is likely secondary ?  Idiopathic. -Patient has been taking Xyosted 75 mg weekly. -Annual labs, testosterone level was normal in November 2024.  # Hypothyroidism -Currently taking levothyroxine 75 mcg daily. -Annual labs, continue current dose at this time.   # Hypokalemia -Currently taking potassium chloride 8 mEq 2 tablet daily, advised to increase to 3 tablets daily.  # Lab in 2 weeks as ordered.  Patient will complete at  [facility] locally.  Kinney was seen today for follow-up.  Diagnoses and all orders for this visit:  Pseudohypoparathyroidism -  Albumin; Future -     Basic Metabolic Panel (BMET); Future  Vitamin D deficiency -     calcitRIOL (ROCALTROL) 0.5 MCG capsule; Take 2 capsules (1 mcg total) by mouth daily.  Hypokalemia -     potassium chloride (KLOR-CON) 8 MEQ tablet; Take 1 tablet (8 mEq total) by mouth 3 (three) times daily.  Hypogonadotropic hypogonadism (HCC)  Hypothyroidism (acquired)     DISPOSITION Follow up in clinic in 3 months suggested.  All questions answered and patient verbalized understanding of the plan.  Iraq Zainab Crumrine, MD [facility] Endocrinology [facility] [address] [place], [address] [place], [place] [address] Phone # [phone]  At least part of this note was generated using voice recognition software. Inadvertent word errors may have occurred, which were not recognized during the proofreading process.

## 2023-05-31 NOTE — Telephone Encounter (Signed)
 Corrected serum calcium with albumin on recent lab 7.2 (serum calcium 7.6 and albumin 4.6).  He used to take vitamin D3 supplement along with calcitriol.  Recently he has not been taking vitamin D3 supplement for sort time.  I advised to restart taking vitamin D3 supplement as well on the clinic visit on March 6.  Iraq Rayen Palen, MD [facility] Endocrinology [facility] [address] [place], [address] [place], [place] [address] Phone # [phone]

## 2023-06-05 NOTE — Telephone Encounter (Signed)
 Noted.

## 2023-06-07 DIAGNOSIS — F3181 Bipolar II disorder: Secondary | ICD-10-CM | POA: Diagnosis not present

## 2023-06-07 DIAGNOSIS — F411 Generalized anxiety disorder: Secondary | ICD-10-CM | POA: Diagnosis not present

## 2023-06-07 DIAGNOSIS — F902 Attention-deficit hyperactivity disorder, combined type: Secondary | ICD-10-CM | POA: Diagnosis not present

## 2023-06-10 DIAGNOSIS — F3131 Bipolar disorder, current episode depressed, mild: Secondary | ICD-10-CM | POA: Diagnosis not present

## 2023-06-11 ENCOUNTER — Encounter: Payer: Self-pay | Admitting: Endocrinology

## 2023-06-13 ENCOUNTER — Other Ambulatory Visit: Payer: Self-pay | Admitting: Endocrinology

## 2023-06-13 DIAGNOSIS — E201 Pseudohypoparathyroidism: Secondary | ICD-10-CM | POA: Diagnosis not present

## 2023-06-14 ENCOUNTER — Telehealth: Payer: Self-pay

## 2023-06-14 ENCOUNTER — Encounter: Payer: Self-pay | Admitting: Endocrinology

## 2023-06-14 LAB — BASIC METABOLIC PANEL
BUN/Creatinine Ratio: 9 (ref 9–20)
BUN: 8 mg/dL (ref 6–20)
CO2: 26 mmol/L (ref 20–29)
Calcium: 8.7 mg/dL (ref 8.7–10.2)
Chloride: 96 mmol/L (ref 96–106)
Creatinine, Ser: 0.86 mg/dL (ref 0.76–1.27)
Glucose: 117 mg/dL — ABNORMAL HIGH (ref 70–99)
Potassium: 3.5 mmol/L (ref 3.5–5.2)
Sodium: 139 mmol/L (ref 134–144)
eGFR: 120 mL/min/{1.73_m2} (ref >59)

## 2023-06-14 LAB — ALBUMIN: Albumin: 4.5 g/dL (ref 4.3–5.2)

## 2023-06-14 NOTE — Telephone Encounter (Signed)
 Patient given results and medication changes as directed by MD. No further questions at this time.

## 2023-06-14 NOTE — Telephone Encounter (Signed)
-----   Message from [place] Thapa sent at 06/14/2023  8:12 AM EDT ----- Patient has normal serum calcium, of 8.7, albumin 4.5 and corrected serum calcium of 8.3.  Continue current dose of calcium, calcitriol and vitamin D3.  Please resend for [facility] prior authorization.

## 2023-06-14 NOTE — Telephone Encounter (Signed)
 PA for Dorna Bloom initially denied however due to recent lab changes, MD requesting it to be re-reviewed.

## 2023-06-17 ENCOUNTER — Telehealth: Payer: Self-pay

## 2023-06-17 NOTE — Telephone Encounter (Signed)
 Pharmacy Patient Advocate Encounter   Received notification from Pt Calls Messages that prior authorization for Kevin Ramos is required/requested.   Insurance verification completed.   The patient is insured through [employer] .   Per test claim: PA required; PA submitted to above mentioned insurance via CoverMyMeds Key/confirmation #/EOC [id number] Status is pending

## 2023-06-18 DIAGNOSIS — K5909 Other constipation: Secondary | ICD-10-CM | POA: Diagnosis not present

## 2023-06-18 DIAGNOSIS — R6881 Early satiety: Secondary | ICD-10-CM | POA: Diagnosis not present

## 2023-06-18 DIAGNOSIS — K635 Polyp of colon: Secondary | ICD-10-CM | POA: Diagnosis not present

## 2023-06-18 NOTE — Telephone Encounter (Signed)
 Pharmacy Patient Advocate Encounter  Received notification from [facility] that Prior Authorization for Kevin Ramos has been APPROVED from 06-17-2023 to 12-16-2023   PA #/Case ID/Reference #: [id number]

## 2023-06-28 ENCOUNTER — Telehealth: Payer: Self-pay

## 2023-06-28 NOTE — Telephone Encounter (Signed)
 Patient called and advised to call office when he starts taking the [place]. Patient has not yet received but contracted with RN to call when he starts to set up labs as advised by MD. No further questions at this time.

## 2023-07-01 ENCOUNTER — Encounter: Payer: Self-pay | Admitting: Endocrinology

## 2023-07-01 DIAGNOSIS — F3131 Bipolar disorder, current episode depressed, mild: Secondary | ICD-10-CM | POA: Diagnosis not present

## 2023-07-01 DIAGNOSIS — E201 Pseudohypoparathyroidism: Secondary | ICD-10-CM

## 2023-07-02 ENCOUNTER — Telehealth: Payer: Self-pay

## 2023-07-02 NOTE — Telephone Encounter (Signed)
-----   Message from [place] Thapa sent at 06/28/2023 12:58 PM EDT ----- Patient has approved to get Yorvipath injectable medicine for hypocalcemia.  Please ask patient to call our clinic when he starts to take this medicine.  We need to monitor his laboratory test.  Thanks. ----- Message ----- From: Beverely Pace, CMA Sent: 06/21/2023   3:24 PM EDT To: [place] Thapa, MD

## 2023-07-02 NOTE — Telephone Encounter (Signed)
 Lvm for pt to call back.

## 2023-07-03 NOTE — Telephone Encounter (Signed)
 Reviewed initiation and titration guideline of Yorvipath.  He is currently taking calcitriol 1 mcg daily.  Plan / recommendation: -After starting injection of Yorvipath , stop taking calcitriol.  Continue current dose of calcium and vitamin D3 supplement. -Check renal function panel in 10 days of the first injection.  Please arrange for the lab.  Iraq Lyrika Souders, MD [facility] Endocrinology [facility] [address] [place], [address] [place], [place] [address] Phone # [phone]

## 2023-07-16 NOTE — Telephone Encounter (Signed)
 With start of calcium  injection, ultimate plan is to stop supplement of calcium , vitamin D  and calcitriol .  However in the beginning we have to gradually taper these oral medications, and gradually increase the dose of calcium  injection Yorvipath .  This is a every day injection and it is subcutaneous, it comes with prefilled multidose pen and you have to use a small pen needle for the injection.  I would recommend to stay on this injection medicine.  Please let me know if have any question.  Iraq Zoe Goonan, MD [facility] [address] [place], [address] [place], [place] [address] Phone # [phone]'

## 2023-07-21 ENCOUNTER — Other Ambulatory Visit: Payer: Self-pay | Admitting: Urology

## 2023-07-22 DIAGNOSIS — F3131 Bipolar disorder, current episode depressed, mild: Secondary | ICD-10-CM | POA: Diagnosis not present

## 2023-07-23 ENCOUNTER — Encounter: Payer: Self-pay | Admitting: Dermatology

## 2023-07-23 ENCOUNTER — Ambulatory Visit: Admitting: Dermatology

## 2023-07-23 DIAGNOSIS — D492 Neoplasm of unspecified behavior of bone, soft tissue, and skin: Secondary | ICD-10-CM | POA: Diagnosis not present

## 2023-07-23 DIAGNOSIS — L700 Acne vulgaris: Secondary | ICD-10-CM | POA: Diagnosis not present

## 2023-07-23 DIAGNOSIS — D489 Neoplasm of uncertain behavior, unspecified: Secondary | ICD-10-CM

## 2023-07-23 DIAGNOSIS — D229 Melanocytic nevi, unspecified: Secondary | ICD-10-CM

## 2023-07-23 DIAGNOSIS — Z79899 Other long term (current) drug therapy: Secondary | ICD-10-CM

## 2023-07-23 DIAGNOSIS — L814 Other melanin hyperpigmentation: Secondary | ICD-10-CM | POA: Diagnosis not present

## 2023-07-23 DIAGNOSIS — D225 Melanocytic nevi of trunk: Secondary | ICD-10-CM | POA: Diagnosis not present

## 2023-07-23 DIAGNOSIS — D1801 Hemangioma of skin and subcutaneous tissue: Secondary | ICD-10-CM

## 2023-07-23 DIAGNOSIS — Z1283 Encounter for screening for malignant neoplasm of skin: Secondary | ICD-10-CM | POA: Diagnosis not present

## 2023-07-23 DIAGNOSIS — Z7189 Other specified counseling: Secondary | ICD-10-CM

## 2023-07-23 DIAGNOSIS — Z86018 Personal history of other benign neoplasm: Secondary | ICD-10-CM

## 2023-07-23 DIAGNOSIS — Z840 Family history of diseases of the skin and subcutaneous tissue: Secondary | ICD-10-CM

## 2023-07-23 DIAGNOSIS — L821 Other seborrheic keratosis: Secondary | ICD-10-CM

## 2023-07-23 DIAGNOSIS — L578 Other skin changes due to chronic exposure to nonionizing radiation: Secondary | ICD-10-CM

## 2023-07-23 MED ORDER — DOXYCYCLINE MONOHYDRATE 100 MG PO CAPS: ORAL_CAPSULE | ORAL | 2 refills | Status: DC

## 2023-07-23 NOTE — Progress Notes (Signed)
 Follow-Up Visit   Subjective  Kevin Ramos is a 28 y.o. male who presents for the following: Patient reports he developed a painful cyst at left chest a few months ago,  patient reports cyst ruptured a few days ago.  Also reports a bump in his right ear he would like checked.  Sister was diagnosed with HS   The patient has spots, moles and lesions to be evaluated, some may be new or changing and the patient may have concern these could be cancer.  The following portions of the chart were reviewed this encounter and updated as appropriate: medications, allergies, medical history  The patient presents for Upper Body Skin Exam (UBSE) for skin cancer screening and mole check.  The patient has spots, moles and lesions to be evaluated, some may be new or changing and the patient has concerns that these could be cancer.   Review of Systems:  No other skin or systemic complaints except as noted in HPI or Assessment and Plan.  Objective  Well appearing patient in no apparent distress; mood and affect are within normal limits.    A focused examination was performed of the following areas: Chest, back, arms, face, scalp, ears, abdomen   Relevant exam findings are noted in the Assessment and Plan.  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 Condition is improving with treatment but not currently at goal.  Treatment Plan: Start doxycycline  100 mg capsule - take 1 po bid for 1 week with food and drink  Doxycycline  should be taken with food to prevent nausea. Do not lay down for 30 minutes after taking. Be cautious with sun exposure and use good sun protection while on  this medication. Pregnant women should not take this medication.   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 Observe Start doxycycline  100 mg capsule - take 1 po bid with food and drink for 1 week when flared 14 caps 2 rfs  Doxycycline  should be taken with food to prevent nausea. Do not lay down for 30 minutes after taking. Be cautious with sun exposure and use good sun protection while on this medication. Pregnant women should not take this medication.   LENTIGINES Exam: scattered tan macules Due to sun exposure Treatment Plan: Benign-appearing, observe. Recommend daily broad spectrum sunscreen SPF 30+ to sun-exposed areas, reapply every 2 hours as needed.  Call for any changes  HEMANGIOMA Exam: red papule(s) Discussed benign nature. Recommend observation. Call for changes.  SEBORRHEIC KERATOSIS - Stuck-on, waxy, tan-brown papules and/or plaques  - Benign-appearing - Discussed benign etiology and prognosis. - Observe - Call for any changes  MELANOCYTIC NEVI Exam: Tan-brown and/or pink-flesh-colored symmetric macules and papules  Treatment Plan: Benign appearing on exam today. Recommend observation. Call clinic for new or changing moles. Recommend daily use of broad spectrum spf 30+ sunscreen to sun-exposed areas.    HISTORY OF DYSPLASTIC NEVUS 05/24/2022 left inferior side above waistline  Moderate atypia, close to margin No  evidence of recurrence today Recommend regular full body skin exams Recommend daily broad spectrum sunscreen SPF 30+ to sun-exposed areas, reapply every 2 hours as needed.  Call if any new or changing lesions are noted between office visits  FAMILY HISTORY OF HIDRADENITIS SUPPURATIVA  Sister recently diagnosed with condition  ACNE VULGARIS   Related  Medications doxycycline  (MONODOX ) 100 MG capsule Take 1 capsule by mouth with food and drink twice daily for 1 week for flare NEOPLASM OF UNCERTAIN BEHAVIOR right chest parasternal Epidermal / dermal shaving  Lesion diameter (cm):  0.4 Informed consent: discussed and consent obtained   Timeout: patient name, date of birth, surgical site, and procedure verified   Procedure prep:  Patient was prepped and draped in usual sterile fashion Prep type:  Isopropyl alcohol Anesthesia: the lesion was anesthetized in a standard fashion   Anesthetic:  1% lidocaine  w/ epinephrine 1-100,000 buffered w/ 8.4% NaHCO3 Instrument used: flexible razor blade   Hemostasis achieved with: pressure, aluminum chloride and electrodesiccation   Outcome: patient tolerated procedure well   Post-procedure details: sterile dressing applied and wound care instructions given   Dressing type: bandage and petrolatum   Specimen 1 - Surgical pathology Differential Diagnosis: nevus r/o dysplasia  Small specimen use caution  Check Margins: yes Nevus r/o dysplasia   Return for tbse  in nov - feb, .  I, Randee Busing, CMA, am acting as scribe for Celine Collard, MD.   Documentation: I have reviewed the above documentation for accuracy and completeness, and I agree with the above.  Celine Collard, MD

## 2023-07-23 NOTE — Patient Instructions (Addendum)

## 2023-07-29 LAB — SURGICAL PATHOLOGY

## 2023-07-30 ENCOUNTER — Telehealth: Payer: Self-pay

## 2023-07-30 ENCOUNTER — Encounter: Payer: Self-pay | Admitting: Dermatology

## 2023-07-30 NOTE — Telephone Encounter (Signed)
 Noted.

## 2023-07-30 NOTE — Telephone Encounter (Addendum)
 Called and discussed bx results with patient. He verbalized understanding and denied further questions. Will recheck at next followup----- Message from Celine Collard sent at 07/30/2023 10:00 AM EDT ----- FINAL DIAGNOSIS        1. Skin, right chest parasternal :       DYSPLASTIC COMPOUND NEVUS WITH MODERATE ATYPIA, PERIPHERAL AND DEEP MARGINS       INVOLVED   Moderate Dysplastic Recheck next visit

## 2023-08-06 DIAGNOSIS — F3131 Bipolar disorder, current episode depressed, mild: Secondary | ICD-10-CM | POA: Diagnosis not present

## 2023-08-23 ENCOUNTER — Other Ambulatory Visit: Payer: Self-pay

## 2023-08-23 DIAGNOSIS — E559 Vitamin D deficiency, unspecified: Secondary | ICD-10-CM

## 2023-08-23 MED ORDER — CALCITRIOL 0.5 MCG PO CAPS: 1.0000 ug | ORAL_CAPSULE | Freq: Every day | ORAL | 3 refills | Status: DC

## 2023-08-27 DIAGNOSIS — F3131 Bipolar disorder, current episode depressed, mild: Secondary | ICD-10-CM | POA: Diagnosis not present

## 2023-09-01 DIAGNOSIS — Z96641 Presence of right artificial hip joint: Secondary | ICD-10-CM | POA: Diagnosis not present

## 2023-09-01 DIAGNOSIS — M25551 Pain in right hip: Secondary | ICD-10-CM | POA: Diagnosis not present

## 2023-09-01 DIAGNOSIS — M25561 Pain in right knee: Secondary | ICD-10-CM | POA: Diagnosis not present

## 2023-09-02 DIAGNOSIS — F3181 Bipolar II disorder: Secondary | ICD-10-CM | POA: Diagnosis not present

## 2023-09-02 DIAGNOSIS — F902 Attention-deficit hyperactivity disorder, combined type: Secondary | ICD-10-CM | POA: Diagnosis not present

## 2023-09-02 DIAGNOSIS — F411 Generalized anxiety disorder: Secondary | ICD-10-CM | POA: Diagnosis not present

## 2023-09-10 DIAGNOSIS — G5793 Unspecified mononeuropathy of bilateral lower limbs: Secondary | ICD-10-CM | POA: Diagnosis not present

## 2023-09-16 DIAGNOSIS — R7989 Other specified abnormal findings of blood chemistry: Secondary | ICD-10-CM | POA: Diagnosis not present

## 2023-09-16 DIAGNOSIS — E039 Hypothyroidism, unspecified: Secondary | ICD-10-CM | POA: Diagnosis not present

## 2023-09-16 DIAGNOSIS — Z125 Encounter for screening for malignant neoplasm of prostate: Secondary | ICD-10-CM | POA: Diagnosis not present

## 2023-09-16 DIAGNOSIS — E209 Hypoparathyroidism, unspecified: Secondary | ICD-10-CM | POA: Diagnosis not present

## 2023-09-16 DIAGNOSIS — E291 Testicular hypofunction: Secondary | ICD-10-CM | POA: Diagnosis not present

## 2023-09-16 DIAGNOSIS — Z79890 Hormone replacement therapy (postmenopausal): Secondary | ICD-10-CM | POA: Diagnosis not present

## 2023-09-17 ENCOUNTER — Encounter: Payer: Self-pay | Admitting: Endocrinology

## 2023-09-17 ENCOUNTER — Ambulatory Visit: Admitting: Endocrinology

## 2023-09-17 ENCOUNTER — Ambulatory Visit: Payer: BC Managed Care – PPO | Admitting: Dermatology

## 2023-09-17 VITALS — BP 136/80 | HR 121 | Resp 20 | Ht 69.0 in | Wt 175.4 lb

## 2023-09-17 DIAGNOSIS — E201 Pseudohypoparathyroidism: Secondary | ICD-10-CM | POA: Diagnosis not present

## 2023-09-17 DIAGNOSIS — E038 Other specified hypothyroidism: Secondary | ICD-10-CM | POA: Diagnosis not present

## 2023-09-17 DIAGNOSIS — E230 Hypopituitarism: Secondary | ICD-10-CM | POA: Diagnosis not present

## 2023-09-17 DIAGNOSIS — E559 Vitamin D deficiency, unspecified: Secondary | ICD-10-CM

## 2023-09-17 DIAGNOSIS — E876 Hypokalemia: Secondary | ICD-10-CM

## 2023-09-17 DIAGNOSIS — E039 Hypothyroidism, unspecified: Secondary | ICD-10-CM

## 2023-09-17 NOTE — Progress Notes (Unsigned)
 Outpatient Endocrinology Note [place] Chinyere Galiano, MD  09/18/23  Patient's Name: Kevin Ramos    DOB: Apr 04, 1995    MRN: [id number]  REASON OF VISIT: Follow-up for hypothyroidism / hypocalcemia / hypoparathyroidism / hypogonadism  PCP: Alla Amis, MD  HISTORY OF PRESENT ILLNESS:   Kevin Ramos is a 28 y.o. old male with past medical history as listed below is presented for a follow up of hypoparathyroidism / hypocalcemia / hypothyroidism / hypogonadism.    Pertinent History: Patient was previously seen by Dr. Von and was last time seen in July 2024.  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  Mother has pseudohypoparathyroidism as well. PTH was 760 in 09/2021 ( at [facility]).  -Currently on calcitriol  1 mcg daily. (Increased from 0.5 mcg in order 2025) calcium  1200 mg 2 tablets 1-2 times a day.  Vitamin D3  2000 international unit daily.  Yorvipath  was planned and have approved, he did not like to be on every day injection and refused to be on it.  He preferred to be on oral calcium  and vitamin D  supplement.  # Acquired hypothyroidism :  -Patient was diagnosed with hypothyroidism in December 2020 when TSH was mildly elevated 5.14, he was started on levothyroxine  25 mcg daily and possibly adjusted over time.  He is currently taking levothyroxine  88 mcg daily.  There was question of ? secondary hypothyroidism as well based on prior endocrinology office note  # Hypogonadism -Due to fatigue, low motivation, testosterone  levels were checked and found to have low free testosterone  on 2 occasions in February 2021, and normal LH and prolactin.  Started on testosterone  therapy.  Previously he was on Androderm  4 mg, which was switched to AndroGel .  He had taken Jatenzo  in the past as well.  However even with  237 mcg dose 2 times a day he continued to have low testosterone  with high normal hemoglobin.  Natesto  was stopped because of recurrent sinus infection.  He was lately on Xyosted  75 mg weekly since May 2024.  With review of medical record in care everywhere he has also been following at [facility] endocrinology for hypogonadism.   Interval history  Patient reports compliance with calcitriol , calcium  and vitamin D .  Denies any paresthesia, numbness and tingling of the extremities.  He reports compliance with levothyroxine , denies hypo and hyperthyroid symptoms overall feeling normal energy.  He has been following with [facility] endocr[facility] for hypogonadism, saw yesterday and plan for testosterone  cypionate 100 mg weekly.  He will follow-up with [facility] endocrinology for hypogonadism.  No other complaints today.  He has plan to move to [place]  in few weeks, however he will continue to follow-up in this clinic.  REVIEW OF SYSTEMS:  As per history of present illness.   PAST MEDICAL HISTORY: Past Medical History:  Diagnosis Date   Acne    Depression    Dysplastic nevus 02/09/2019   right upper back, moderate atypia, close to margin.   Dysplastic nevus 05/24/2022   Left inferior side above waistline. Moderate atypia, closed to but does not involve margin.   Dysplastic nevus 07/23/2023   right chest parasternal - moderate - will recheck at followup    PAST SURGICAL HISTORY: History reviewed. No pertinent surgical history.  ALLERGIES: Allergies  Allergen Reactions   Abilify  [Aripiprazole ] Nausea Only   Effexor  [Venlafaxine ] Nausea Only    FAMILY HISTORY:  Family History  Problem Relation Age of Onset   Hypoparathyroidism Mother     SOCIAL HISTORY: Social History   Socioeconomic History   Marital status: Single    Spouse name: Not on file   Number of children: Not on file   Years of education: Not on file   Highest education level: Not on file  Occupational History   Not on file   Tobacco Use   Smoking status: Never   Smokeless tobacco: Never  Substance and Sexual Activity   Alcohol use: Never   Drug use: No   Sexual activity: Not on file  Other Topics Concern   Not on file  Social History Narrative   Not on file   Social Drivers of Health   Financial Resource Strain: Low Risk  (09/15/2023)   Received from [facility]   Overall Financial Resource Strain (CARDIA)    Difficulty of Paying Living Expenses: Not very hard  Food Insecurity: Food Insecurity Present (09/15/2023)   Received from [facility]   Hunger Vital Sign    Within the past 12 months, you worried that your food would run out before you got the money to buy more.: Sometimes true    Within the past 12 months, the food you bought just didn't last and you didn't have money to get more.: Sometimes true  Transportation Needs: No Transportation Needs (09/15/2023)   Received from [facility] - Transportation    In the past 12 months, has lack of transportation kept you from medical appointments or from getting medications?: No    Lack of Transportation (Non-Medical): No  Physical Activity: Not on file  Stress: Not on file  Social Connections: Not on file    MEDICATIONS:  Current Outpatient Medications  Medication Sig Dispense Refill   acetaminophen  (TYLENOL ) 500 MG tablet Take 500 mg by mouth every 6 (six) hours as needed.     alclomethasone (ACLOVATE ) 0.05 % ointment Apply topically to lips qd/bid prn for dryness (Patient taking differently: as needed. Apply topically to lips qd/bid prn for dryness) 30 g 1   alfuzosin  (UROXATRAL ) 10 MG 24 hr tablet TAKE 1 TABLET (10 MG TOTAL) BY MOUTH DAILY WITH BREAKFAST. 90 tablet 0   amphetamine -dextroamphetamine  (ADDERALL) 5 MG tablet Take 1 tablet (5 mg total) by mouth daily. 30 tablet 0   asenapine (SAPHRIS) 5 MG SUBL 24 hr tablet Place under the tongue.     azelastine (ASTELIN) 0.1 % nasal spray  Place into the nose. (Patient taking differently: Place into the nose as needed.)     calcium  carbonate (OSCAL) 1500 (600 Ca) MG TABS tablet Take by mouth.     Calcium  Carbonate-Vit D-Min (CALCIUM  1200 PO) Take 2 capsules by mouth daily.  (Patient taking differently: Take 2 capsules by mouth as needed.)     cetirizine (ZYRTEC) 10 MG tablet Take by mouth.     DULoxetine  (CYMBALTA ) 30 MG capsule Take 2 capsules in the morning and 1 capsule in the evening 90 capsule 1   emtricitabine-tenofovir (TRUVADA) 200-300 MG tablet Take 1 tablet by mouth daily.     ferrous sulfate 325 (65 FE) MG EC tablet Take 1 tablet by mouth every other day.     FLUoxetine (PROZAC) 40 MG capsule Take 40 mg by mouth every morning.     fluticasone (FLONASE) 50 MCG/ACT nasal spray Place into the nose.     folic acid (FOLVITE) 1 MG tablet Take 1  mg by mouth daily.     ISOtretinoin  Micronized (ABSORICA  LD) 32 MG CAPS Take 2 capsules by mouth daily. 60 capsule 0   ketoconazole  (NIZORAL ) 2 % cream Apply to groin QD. (Patient taking differently: as needed. Apply to groin QD.) 60 g 0   ketoconazole  (NIZORAL ) 2 % shampoo Apply 1 Application topically as directed. Shampoo scalp 3 times per week (Patient taking differently: Apply 1 Application topically as needed. Shampoo scalp 3 times per week) 120 mL 11   lidocaine  (LIDODERM ) 5 % 1 patch daily.     meloxicam  (MOBIC ) 15 MG tablet TAKE 1 TABLET BY MOUTH EVERY DAY 30 tablet 0   mupirocin  ointment (BACTROBAN ) 2 % Apply to aa TID x 2 weeks. (Patient taking differently: as needed. Apply to aa TID x 2 weeks.) 22 g 0   nystatin (MYCOSTATIN) 100000 UNIT/ML suspension Take by mouth.     pregabalin (LYRICA) 75 MG capsule Take 1 capsule by mouth 2 (two) times daily.     tacrolimus  (PROTOPIC ) 0.1 % ointment Apply topically as directed. Qd to bid to aa lips prn flares (Patient taking differently: Apply topically as needed. Qd to bid to aa lips prn flares) 60 g 1   tadalafil  (CIALIS ) 20 MG tablet  Take 1 tab 1 hour prior to intercourse 10 tablet 0   Vitamin D , Ergocalciferol , (DRISDOL ) 1.25 MG (50000 UNIT) CAPS capsule Take 50,000 Units by mouth once a week.     calcitRIOL  (ROCALTROL ) 0.5 MCG capsule Take 2 capsules (1 mcg total) by mouth daily. 180 capsule 3   doxycycline  (MONODOX ) 100 MG capsule Take 1 capsule by mouth with food and drink twice daily for 1 week for flare (Patient not taking: Reported on 09/17/2023) 14 capsule 2   levothyroxine  (SYNTHROID ) 88 MCG tablet Take 1 tablet (88 mcg total) by mouth daily before breakfast. 90 tablet 3   potassium chloride  (KLOR-CON ) 8 MEQ tablet Take 1 tablet (8 mEq total) by mouth 3 (three) times daily. 270 tablet 3   No current facility-administered medications for this visit.    PHYSICAL EXAM: Vitals:   09/17/23 1117  BP: 136/80  Pulse: (!) 121  Resp: 20  SpO2: 97%  Weight: 175 lb 6.4 oz (79.6 kg)  Height: 5' 9 (1.753 m)   Body mass index is 25.9 kg/m.  Wt Readings from Last 3 Encounters:  09/17/23 175 lb 6.4 oz (79.6 kg)  05/30/23 178 lb (80.7 kg)  04/24/23 180 lb (81.6 kg)    General: Well developed, well nourished male in no apparent distress.  HEENT: AT/[place], no external lesions. Hearing intact to the spoken word Eyes: Conjunctiva clear and no icterus. Neck: Trachea midline, neck supple  Neurologic: Alert, oriented, normal speech.  Chovstek's sign negative. DTR 2+ Extremities: No pedal pitting edema Skin: Warm, color good.  PERTINENT HISTORIC LABORATORY AND IMAGING STUDIES:  All pertinent laboratory results were reviewed. Please see HPI also for further details.   TSH  Date Value Ref Range Status  04/12/2021 3.85 0.35 - 5.50 uIU/mL Final  02/14/2021 4.06 0.35 - 5.50 uIU/mL Final  07/20/2020 1.92 0.35 - 4.50 uIU/mL Final     ASSESSMENT / PLAN  1. Pseudohypoparathyroidism   2. Hypogonadotropic hypogonadism (HCC)   3. Hypothyroidism (acquired)   4. Hypothyroidism, secondary   5. Hypokalemia   6. Vitamin D   deficiency     # Pseudohypoparathyroidism -He has hypocalcemia.  Historically not fully compliant with calcium  and vitamin D  supplement.  He has mildly low serum calcium . -  Discussed about compliance with calcium  and vitamin D  intake.  He reports compliance with calcitriol , calcium . - Yorvipath  was planned and approved, patient later refused to take it due to not liking daily injection.  Plan: - Continue current medications including calcitriol  1 mcg daily.  Calcium  carbonate 1200 mg 2 tablets 2 times a day.  Vitamin D3 over-the-counter 2000 international unit daily.   - Will check renal function panel today.  # Hypogonadism is likely secondary ?  Idiopathic. -Patient was taking Xyosted  75 mg weekly.  He has been following with [facility] end[facility].  He will continue to follow-up there.  Noted he was started on testosterone  cypionate 100 mg weekly, had seen yesterday.  # Hypothyroidism -Currently taking levothyroxine  88 mcg daily. - Will check thyroid  function test today.  # Hypokalemia - On potassium chloride  8 mEq 3 tablets daily.  Diagnoses and all orders for this visit:  Pseudohypoparathyroidism -     Renal function panel  Hypogonadotropic hypogonadism (HCC)  Hypothyroidism (acquired) -     levothyroxine  (SYNTHROID ) 88 MCG tablet; Take 1 tablet (88 mcg total) by mouth daily before breakfast.  Hypothyroidism, secondary -     T4, free  Hypokalemia -     potassium chloride  (KLOR-CON ) 8 MEQ tablet; Take 1 tablet (8 mEq total) by mouth 3 (three) times daily.  Vitamin D  deficiency -     calcitRIOL  (ROCALTROL ) 0.5 MCG capsule; Take 2 capsules (1 mcg total) by mouth daily.   Labs reviewed serum calcium  is mildly low and is acceptable 8.4.  Potassium is low at 3.2.  Thyroid  function test normal.  Will encourage for compliance with potassium chloride , can eat potassium rich food/meals for example banana, green vegetables.  Recheck serum potassium in 2 weeks.  Continue current dose of  levothyroxine , calcium , calcitriol  and vitamin D3.   Labs reviewed serum calcium  is mildly low and is acceptable 8.4.  Potassium is low at 3.2.  Thyroid  function test normal. Encourage for compliance with potassium chloride , can eat potassium rich food/meals for example banana, green vegetables.  Recheck serum potassium in 2 weeks.  Continue current dose of levothyroxine , calcium , calcitriol  and vitamin D3.  Arrange for lab visit for potassium level or patient can complete lab for potassium locally and fax result to us  in 2 weeks.   Latest Reference Range & Units 09/17/23 11:43  Sodium 135 - 146 mmol/L 138  Potassium 3.5 - 5.3 mmol/L 3.2 (L)  Chloride 98 - 110 mmol/L 98  CO2 20 - 32 mmol/L 26  Glucose 65 - 139 mg/dL 868  BUN 7 - 25 mg/dL 12  Creatinine 9.39 - 8.73 mg/dL 9.11  Calcium  8.6 - 10.3 mg/dL 8.4 (L)  BUN/Creatinine Ratio 6 - 22 (calc) SEE NOTE:  Phosphorus 2.5 - 4.5 mg/dL 2.2 (L)  (L): Data is abnormally low   Latest Reference Range & Units 09/17/23 11:43  Glucose 65 - 139 mg/dL 868  U5,Qmzz(Ipmzru) 0.8 - 1.8 ng/dL 1.1  Albumin MSPROF 3.6 - 5.1 g/dL 4.8     DISPOSITION Follow up in clinic in 5-6 months suggested.  He has plan to move to [place]  however he will be traveling this area in November to December timeframe, he will follow-up around that time.  Asked to call our clinic in between visits with any questions.  All questions answered and patient verbalized understanding of the plan.  Iraq Aram Domzalski, MD [facility] Endocrinology [facility] [address] [place], [address] [place], [place] [address] Phone # [phone]  At least part of this note was generated using voice recognition software. Inadvertent word errors may have occurred, which were not recognized during the proofreading process.

## 2023-09-18 ENCOUNTER — Ambulatory Visit: Payer: Self-pay | Admitting: Endocrinology

## 2023-09-18 DIAGNOSIS — E876 Hypokalemia: Secondary | ICD-10-CM

## 2023-09-18 LAB — RENAL FUNCTION PANEL
Albumin: 4.8 g/dL (ref 3.6–5.1)
BUN: 12 mg/dL (ref 7–25)
CO2: 26 mmol/L (ref 20–32)
Calcium: 8.4 mg/dL — ABNORMAL LOW (ref 8.6–10.3)
Chloride: 98 mmol/L (ref 98–110)
Creat: 0.88 mg/dL (ref 0.60–1.26)
Glucose, Bld: 131 mg/dL (ref 65–139)
Phosphorus: 2.2 mg/dL — ABNORMAL LOW (ref 2.5–4.5)
Potassium: 3.2 mmol/L — ABNORMAL LOW (ref 3.5–5.3)
Sodium: 138 mmol/L (ref 135–146)

## 2023-09-18 LAB — T4, FREE: Free T4: 1.1 ng/dL (ref 0.8–1.8)

## 2023-09-18 MED ORDER — POTASSIUM CHLORIDE ER 8 MEQ PO TBCR: 8.0000 meq | EXTENDED_RELEASE_TABLET | Freq: Three times a day (TID) | ORAL | 3 refills | Status: AC

## 2023-09-18 MED ORDER — CALCITRIOL 0.5 MCG PO CAPS: 1.0000 ug | ORAL_CAPSULE | Freq: Every day | ORAL | 3 refills | Status: AC

## 2023-09-18 MED ORDER — LEVOTHYROXINE SODIUM 88 MCG PO TABS: 88.0000 ug | ORAL_TABLET | Freq: Every day | ORAL | 3 refills | Status: AC

## 2023-09-21 ENCOUNTER — Other Ambulatory Visit: Payer: Self-pay | Admitting: Endocrinology

## 2023-09-21 DIAGNOSIS — E559 Vitamin D deficiency, unspecified: Secondary | ICD-10-CM

## 2023-09-23 ENCOUNTER — Other Ambulatory Visit: Payer: Self-pay

## 2023-09-23 DIAGNOSIS — F3131 Bipolar disorder, current episode depressed, mild: Secondary | ICD-10-CM | POA: Diagnosis not present

## 2023-09-24 ENCOUNTER — Other Ambulatory Visit: Payer: Self-pay | Admitting: Urology

## 2023-11-15 DIAGNOSIS — F411 Generalized anxiety disorder: Secondary | ICD-10-CM | POA: Diagnosis not present

## 2023-11-15 DIAGNOSIS — F3181 Bipolar II disorder: Secondary | ICD-10-CM | POA: Diagnosis not present

## 2023-11-15 DIAGNOSIS — F902 Attention-deficit hyperactivity disorder, combined type: Secondary | ICD-10-CM | POA: Diagnosis not present

## 2023-11-18 ENCOUNTER — Other Ambulatory Visit (HOSPITAL_COMMUNITY): Payer: Self-pay

## 2023-11-18 ENCOUNTER — Telehealth: Payer: Self-pay | Admitting: Pharmacy Technician

## 2023-11-18 NOTE — Telephone Encounter (Addendum)
 Pharmacy Patient Advocate Encounter   Received notification from CoverMyMeds that prior authorization for Yorvipath  294MCG/0.98ML pen-injectors is required/requested.   Insurance verification completed.   The patient is insured through [employer] . Key: [id number]   Prior Authorization form/request asks a question that requires your assistance. Please see the question below and advise accordingly. The PA will not be submitted until the necessary information is received.   Received a PA for Yorvipath  294MCG/0.98ML pen-injectors. I patiently currently taking that medication?

## 2023-11-18 NOTE — Telephone Encounter (Signed)
Thank you for confirming.

## 2023-12-29 ENCOUNTER — Other Ambulatory Visit: Payer: Self-pay | Admitting: Urology

## 2024-01-28 ENCOUNTER — Ambulatory Visit: Admitting: Dermatology

## 2024-03-11 ENCOUNTER — Encounter: Payer: Self-pay | Admitting: Endocrinology

## 2024-03-11 ENCOUNTER — Ambulatory Visit: Admitting: Endocrinology

## 2024-03-11 ENCOUNTER — Other Ambulatory Visit

## 2024-03-11 VITALS — BP 124/82 | HR 92 | Ht 69.5 in | Wt 186.0 lb

## 2024-03-11 DIAGNOSIS — E876 Hypokalemia: Secondary | ICD-10-CM

## 2024-03-11 DIAGNOSIS — E559 Vitamin D deficiency, unspecified: Secondary | ICD-10-CM

## 2024-03-11 DIAGNOSIS — E039 Hypothyroidism, unspecified: Secondary | ICD-10-CM

## 2024-03-11 DIAGNOSIS — E230 Hypopituitarism: Secondary | ICD-10-CM | POA: Diagnosis not present

## 2024-03-11 DIAGNOSIS — E038 Other specified hypothyroidism: Secondary | ICD-10-CM

## 2024-03-11 DIAGNOSIS — E201 Pseudohypoparathyroidism: Secondary | ICD-10-CM

## 2024-03-11 DIAGNOSIS — D751 Secondary polycythemia: Secondary | ICD-10-CM

## 2024-03-11 NOTE — Progress Notes (Signed)
 Outpatient Endocrinology Note Armonee Bojanowski, MD  03/11/2024  Patient's Name: Kevin Ramos    DOB: [birth year]    MRN: [id number]  REASON OF VISIT: Follow-up for hypothyroidism / hypocalcemia / hypoparathyroidism / hypogonadism  PCP: Alla Amis, MD  HISTORY OF PRESENT ILLNESS:   Kassim Guertin is a 28 y.o. old male with past medical history as listed below is presented for a follow up of hypoparathyroidism / hypocalcemia / hypothyroidism / hypogonadism.    Pertinent History: Patient was previously seen by Dr. Von and was last time seen in July 2024.  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  Mother has pseudohypoparathyroidism as well. PTH was 760 in 09/2021 ( at [facility]).  -Currently on calcitriol  1 mcg daily. (Increased from 0.5 mcg in order 2025) calcium  1200 mg 2 tablets 1-2 times a day.  Vitamin D3  2000 international unit daily.  Yorvipath  was planned and have approved, he did not like to be on every day injection and refused to be on it.  He preferred to be on oral calcium  and vitamin D  supplement.  # Acquired hypothyroidism :  -Patient was diagnosed with hypothyroidism in December 2020 when TSH was mildly elevated 5.14, he was started on levothyroxine  25 mcg daily and possibly adjusted over time.  He is currently taking levothyroxine  88 mcg daily.  There was question of ? secondary hypothyroidism as well based on prior endocrinology office note  # Hypogonadism -Due to fatigue, low motivation, testosterone  levels were checked and found to have low free testosterone  on 2 occasions in February 2021, and normal LH and prolactin.  Started on testosterone  therapy.  Previously he was on Androderm  4 mg, which was switched to AndroGel .  He had taken Jatenzo  in the past as well.  However even with  237 mcg dose 2 times a day he continued to have low testosterone  with high normal hemoglobin.  Natesto  was stopped because of recurrent sinus infection.  He was lately on Xyosted  75 mg weekly since May 2024.  With review of medical record in care everywhere he has also been following at [facility] endocrinology for hypogonadism.  Was last seen in June 2025, testosterone  therapy was stopped, not covered by insurance due to elevated hematocrit/erythrocytosis.   Interval history  Patient moved to [place] , he plans to maintain follow-up in this clinic as he is traveling every few months to this area to meet family.  Presented for follow-up today.  He has been taking calcitriol , calcium , vitamin D  supplement.  He also has been taking potassium however not fully compliant.  He denies paresthesia, numbness or tingling or muscle cramps.  He has been taking levothyroxine  and reports compliance.  He gained some weight since last visit.  Denies hypo or hyperthyroid symptoms.  History of PCP yesterday and had lab work, reviewed lab results from his phone completed yesterday or February 25, 2024, serum calcium  9.2 normal, albumin 2.5, sodium 139, potassium 3.9, eGFR 108, creatinine 0.97, glucose 91.  Normal liver enzymes.  Hemoglobin 17.2 hematocrit 50.3.  Hemoglobin A1c 5.2%.  Patient reports he has not been on testosterone  therapy since June, denied due to erythrocytosis/elevated hematocrit, reviewed endocrinology note from [facility] endocrinology from June 2025.  He is no longer following with [facility] endocrinology.  No other complaints.  Patient reports he has obstructive sleep apnea however has not been using CPAP, has  not found fitting comfortable size.    REVIEW OF SYSTEMS:  As per history of present illness.   PAST MEDICAL HISTORY: Past Medical History:  Diagnosis Date   Acne    Depression    Dysplastic nevus 02/09/2019   right upper back, moderate atypia, close to margin.   Dysplastic nevus 05/24/2022    Left inferior side above waistline. Moderate atypia, closed to but does not involve margin.   Dysplastic nevus 07/23/2023   right chest parasternal - moderate - will recheck at followup    PAST SURGICAL HISTORY: History reviewed. No pertinent surgical history.  ALLERGIES: Allergies  Allergen Reactions   Abilify  [Aripiprazole ] Nausea Only   Effexor  [Venlafaxine ] Nausea Only    FAMILY HISTORY:  Family History  Problem Relation Age of Onset   Hypoparathyroidism Mother     SOCIAL HISTORY: Social History   Socioeconomic History   Marital status: Single    Spouse name: Not on file   Number of children: Not on file   Years of education: Not on file   Highest education level: Not on file  Occupational History   Not on file  Tobacco Use   Smoking status: Never   Smokeless tobacco: Never  Substance and Sexual Activity   Alcohol use: Never   Drug use: No   Sexual activity: Not on file  Other Topics Concern   Not on file  Social History Narrative   Not on file   Social Drivers of Health   Tobacco Use: Low Risk (03/11/2024)   Patient History    Smoking Tobacco Use: Never    Smokeless Tobacco Use: Never    Passive Exposure: Not on file  Financial Resource Strain: Low Risk  (09/15/2023)   Received from [facility]   Overall Financial Resource Strain (CARDIA)    Difficulty of Paying Living Expenses: Not very hard  Food Insecurity: Food Insecurity Present (09/15/2023)   Received from [facility]   Epic    Within the past 12 months, you worried that your food would run out before you got the money to buy more.: Sometimes true    Within the past 12 months, the food you bought just didn't last and you didn't have money to get more.: Sometimes true  Transportation Needs: No Transportation Needs (09/15/2023)   Received from [facility] - Transportation    In the past 12 months, has lack of transportation kept you  from medical appointments or from getting medications?: No    Lack of Transportation (Non-Medical): No  Physical Activity: Not on file  Stress: Not on file  Social Connections: Not on file  Depression (EYV7-0): Not on file  Alcohol Screen: Not on file  Housing: Low Risk  (09/15/2023)   Received from [facility]   Epic    In the last 12 months, was there a time when you were not able to pay the mortgage or rent on time?: No    In the past 12 months, how many times have you moved where you were living?: 1    At any time in the past 12 months, were you homeless or living in a shelter (including now)?: No  Utilities: Not At Risk (09/15/2023)   Received from [facility]   Epic    In the past 12 months has the electric, gas, oil, or water company threatened to shut off services in your home?: No  Health Literacy:  Not on file    MEDICATIONS:  Current Outpatient Medications  Medication Sig Dispense Refill   acetaminophen  (TYLENOL ) 500 MG tablet Take 500 mg by mouth every 6 (six) hours as needed.     alclomethasone (ACLOVATE ) 0.05 % ointment Apply topically to lips qd/bid prn for dryness (Patient taking differently: as needed. Apply topically to lips qd/bid prn for dryness) 30 g 1   alfuzosin  (UROXATRAL ) 10 MG 24 hr tablet TAKE 1 TABLET (10 MG TOTAL) BY MOUTH DAILY WITH BREAKFAST. 90 tablet 0   amphetamine -dextroamphetamine  (ADDERALL) 5 MG tablet Take 1 tablet (5 mg total) by mouth daily. 30 tablet 0   asenapine (SAPHRIS) 5 MG SUBL 24 hr tablet Place under the tongue.     azelastine (ASTELIN) 0.1 % nasal spray Place into the nose. (Patient taking differently: Place into the nose as needed.)     calcitRIOL  (ROCALTROL ) 0.5 MCG capsule Take 2 capsules (1 mcg total) by mouth daily. 180 capsule 3   calcium  carbonate (OSCAL) 1500 (600 Ca) MG TABS tablet Take by mouth.     Calcium  Carbonate-Vit D-Min (CALCIUM  1200 PO) Take 2 capsules by mouth daily.  (Patient taking  differently: Take 2 capsules by mouth as needed.)     cetirizine (ZYRTEC) 10 MG tablet Take by mouth.     DULoxetine  (CYMBALTA ) 30 MG capsule Take 2 capsules in the morning and 1 capsule in the evening 90 capsule 1   emtricitabine-tenofovir (TRUVADA) 200-300 MG tablet Take 1 tablet by mouth daily.     ferrous sulfate 325 (65 FE) MG EC tablet Take 1 tablet by mouth every other day.     FLUoxetine (PROZAC) 40 MG capsule Take 40 mg by mouth every morning.     fluticasone (FLONASE) 50 MCG/ACT nasal spray Place into the nose.     folic acid (FOLVITE) 1 MG tablet Take 1 mg by mouth daily.     ISOtretinoin  Micronized (ABSORICA  LD) 32 MG CAPS Take 2 capsules by mouth daily. 60 capsule 0   ketoconazole  (NIZORAL ) 2 % cream Apply to groin QD. (Patient taking differently: as needed. Apply to groin QD.) 60 g 0   ketoconazole  (NIZORAL ) 2 % shampoo Apply 1 Application topically as directed. Shampoo scalp 3 times per week (Patient taking differently: Apply 1 Application topically as needed. Shampoo scalp 3 times per week) 120 mL 11   levothyroxine  (SYNTHROID ) 88 MCG tablet Take 1 tablet (88 mcg total) by mouth daily before breakfast. 90 tablet 3   lidocaine  (LIDODERM ) 5 % 1 patch daily.     meloxicam  (MOBIC ) 15 MG tablet TAKE 1 TABLET BY MOUTH EVERY DAY 30 tablet 0   mupirocin  ointment (BACTROBAN ) 2 % Apply to aa TID x 2 weeks. (Patient taking differently: as needed. Apply to aa TID x 2 weeks.) 22 g 0   nystatin (MYCOSTATIN) 100000 UNIT/ML suspension Take by mouth.     potassium chloride  (KLOR-CON ) 8 MEQ tablet Take 1 tablet (8 mEq total) by mouth 3 (three) times daily. 270 tablet 3   pregabalin (LYRICA) 75 MG capsule Take 1 capsule by mouth 2 (two) times daily.     tacrolimus  (PROTOPIC ) 0.1 % ointment Apply topically as directed. Qd to bid to aa lips prn flares (Patient taking differently: Apply topically as needed. Qd to bid to aa lips prn flares) 60 g 1   tadalafil  (CIALIS ) 20 MG tablet Take 1 tab 1 hour prior  to intercourse 10 tablet 0   Vitamin D , Ergocalciferol , (DRISDOL ) 1.25 MG (50000  UNIT) CAPS capsule Take 50,000 Units by mouth once a week.     No current facility-administered medications for this visit.    PHYSICAL EXAM: Vitals:   03/11/24 0946  BP: 124/82  Pulse: 92  SpO2: 97%  Weight: 186 lb (84.4 kg)  Height: 5' 9.5 (1.765 m)    Body mass index is 27.07 kg/m.  Wt Readings from Last 3 Encounters:  03/11/24 186 lb (84.4 kg)  09/17/23 175 lb 6.4 oz (79.6 kg)  05/30/23 178 lb (80.7 kg)    General: Well developed, well nourished male in no apparent distress.  HEENT: AT/Secretary, no external lesions. Hearing intact to the spoken word Eyes: Conjunctiva clear and no icterus. Neck: Trachea midline, neck supple  Neurologic: Alert, oriented, normal speech.  Chovstek's sign negative. DTR 2+ Extremities: No pedal pitting edema Skin: Warm, color good.  PERTINENT HISTORIC LABORATORY AND IMAGING STUDIES:  All pertinent laboratory results were reviewed. Please see HPI also for further details.   TSH  Date Value Ref Range Status  04/12/2021 3.85 0.35 - 5.50 uIU/mL Final  02/14/2021 4.06 0.35 - 5.50 uIU/mL Final  07/20/2020 1.92 0.35 - 4.50 uIU/mL Final     ASSESSMENT / PLAN  1. Pseudohypoparathyroidism   2. Vitamin D  deficiency   3. Hypokalemia   4. Hypogonadotropic hypogonadism   5. Hypothyroidism (acquired)   6. Hypothyroidism, secondary   7. Erythrocytosis     # Pseudohypoparathyroidism -He has hypocalcemia.  Historically not fully compliant with calcium  and vitamin D  supplement.  He has mildly low serum calcium . -Discussed about compliance with calcium  and vitamin D  intake.  He reports compliance with calcitriol , calcium  and vitamin D3. - Yorvipath  was planned and approved, patient later refused to take it due to not liking daily injection. - Recent serum calcium  normal yesterday 9.2.  Plan: - Continue current medications including calcitriol  1 mcg daily.  Calcium   carbonate 1200 mg 2 tablets 2 times a day.  Advised to take twice a day.  He is mostly taking once a day.  Vitamin D3 over-the-counter 2000 international unit daily.   - Will check vitamin D  level today.  He had CMP yesterday with primary care provider, reviewed and noted the results in HPI.  # Hypothyroidism ?  Secondary -Currently taking levothyroxine  88 mcg daily. - Will check thyroid  function test today.  # Hypokalemia - On potassium chloride  8 mEq 3 tablets daily.  Yesterday serum potassium normal at 3.9.  # Hypogonadism is likely secondary ?  Idiopathic / Erythrocytosis -Patient was taking Xyosted  75 mg weekly.  He was following with [facility] endocrinology.  Reviewed note, last time was seen in June 2025.  Testosterone  therapy was denied by insurance due to erythrocytosis/elevated hematocrit.  He is no longer following with [facility] endocrinology and he was to follow-up in this clinic.   - Patient has erythrocytosis, hemoglobin was 17.2 yesterday. - Patient reports he also has obstructive sleep apnea, currently not using CPAP not able to find fitting size.  Plan: - Discussed that his obstructive sleep apnea needs to be treated before resuming testosterone  therapy.  Asked patient to discuss with primary care provider to find sleep clinic or pulmonary to evaluate and proper treatment of obstructive sleep apnea. - He has erythrocytosis probably related to untreated obstructive sleep apnea.  He has not been on testosterone  therapy for more than 6 months.  Asked patient to discuss with primary care provider for referral to hematology for evaluation and management.  Discussed that he should have improvement  on the erythrocytosis before resuming testosterone  therapy. - He wants to check lab for testosterone  we will check total, free testosterone  along with gonadotropins.  Juanantonio was seen today for follow-up.  Diagnoses and all orders for this visit:  Pseudohypoparathyroidism  Vitamin D  deficiency -      VITAMIN D  25 Hydroxy (Vit-D Deficiency, Fractures)  Hypokalemia  Hypogonadotropic hypogonadism -     Testosterone  , Free and Total -     Follicle stimulating hormone -     Luteinizing hormone  Hypothyroidism (acquired)  Hypothyroidism, secondary -     T4, free  Erythrocytosis   Patient will call with updated pharmacy information to prescribe his medication to [place] .  DISPOSITION Follow up in clinic in 5-6 months suggested.  He is now living in [place]  however he travels every few months to this area, he wants to keep follow-up in this clinic.   All questions answered and patient verbalized understanding of the plan.  Chynna Buerkle, MD [facility] [address] [place], [address] [place], [place] [address] Phone # [phone]  At least part of this note was generated using voice recognition software. Inadvertent word errors may have occurred, which were not recognized during the proofreading process.

## 2024-03-11 NOTE — Addendum Note (Signed)
 Addended by: Jerrol Helmers on: 03/11/2024 12:47 PM   Modules accepted: Level of Service

## 2024-03-12 LAB — FOLLICLE STIMULATING HORMONE: FSH: 3.8 m[IU]/mL (ref 1.4–12.8)

## 2024-03-12 LAB — VITAMIN D 25 HYDROXY (VIT D DEFICIENCY, FRACTURES): Vit D, 25-Hydroxy: 38 ng/mL (ref 30–100)

## 2024-03-12 LAB — LUTEINIZING HORMONE: LH: 3.5 m[IU]/mL (ref 1.5–9.3)

## 2024-03-12 LAB — T4, FREE: Free T4: 1.3 ng/dL (ref 0.8–1.8)

## 2024-03-17 ENCOUNTER — Encounter: Payer: Self-pay | Admitting: Dermatology

## 2024-03-17 ENCOUNTER — Ambulatory Visit: Admitting: Dermatology

## 2024-03-17 DIAGNOSIS — L578 Other skin changes due to chronic exposure to nonionizing radiation: Secondary | ICD-10-CM

## 2024-03-17 DIAGNOSIS — L821 Other seborrheic keratosis: Secondary | ICD-10-CM

## 2024-03-17 DIAGNOSIS — D225 Melanocytic nevi of trunk: Secondary | ICD-10-CM | POA: Diagnosis not present

## 2024-03-17 DIAGNOSIS — L700 Acne vulgaris: Secondary | ICD-10-CM

## 2024-03-17 DIAGNOSIS — Z808 Family history of malignant neoplasm of other organs or systems: Secondary | ICD-10-CM | POA: Diagnosis not present

## 2024-03-17 DIAGNOSIS — L814 Other melanin hyperpigmentation: Secondary | ICD-10-CM

## 2024-03-17 DIAGNOSIS — L905 Scar conditions and fibrosis of skin: Secondary | ICD-10-CM | POA: Diagnosis not present

## 2024-03-17 DIAGNOSIS — L813 Cafe au lait spots: Secondary | ICD-10-CM | POA: Diagnosis not present

## 2024-03-17 DIAGNOSIS — Z1283 Encounter for screening for malignant neoplasm of skin: Secondary | ICD-10-CM

## 2024-03-17 DIAGNOSIS — D229 Melanocytic nevi, unspecified: Secondary | ICD-10-CM

## 2024-03-17 DIAGNOSIS — Z86018 Personal history of other benign neoplasm: Secondary | ICD-10-CM

## 2024-03-17 DIAGNOSIS — W908XXA Exposure to other nonionizing radiation, initial encounter: Secondary | ICD-10-CM | POA: Diagnosis not present

## 2024-03-17 DIAGNOSIS — D492 Neoplasm of unspecified behavior of bone, soft tissue, and skin: Secondary | ICD-10-CM

## 2024-03-17 NOTE — Patient Instructions (Signed)

## 2024-03-17 NOTE — Progress Notes (Signed)
 "  Follow-Up Visit   Subjective  Kevin Ramos is a 28 y.o. male who presents for the following: Skin Cancer Screening and Full Body Skin Exam, patient reports no areas of concern today. Hx of dysplastic nevi.  The patient presents for Total-Body Skin Exam (TBSE) for skin cancer screening and mole check. The patient has spots, moles and lesions to be evaluated, some may be new or changing and the patient may have concern these could be cancer.  The following portions of the chart were reviewed this encounter and updated as appropriate: medications, allergies, medical history  Review of Systems:  No other skin or systemic complaints except as noted in HPI or Assessment and Plan.  Objective  Well appearing patient in no apparent distress; mood and affect are within normal limits.  A full examination was performed including scalp, head, eyes, ears, nose, lips, neck, chest, axillae, abdomen, back, buttocks, bilateral upper extremities, bilateral lower extremities, hands, feet, fingers, toes, fingernails, and toenails. All findings within normal limits unless otherwise noted below.   Relevant physical exam findings are noted in the Assessment and Plan.  left pectoral periareolar lower outer quad at 4 o'clock 0.7 cm Irregular brown macule   Assessment & Plan   SKIN CANCER SCREENING PERFORMED TODAY.  ACTINIC DAMAGE - Chronic condition, secondary to cumulative UV/sun exposure - diffuse scaly erythematous macules with underlying dyspigmentation - Recommend daily broad spectrum sunscreen SPF 30+ to sun-exposed areas, reapply every 2 hours as needed.  - Staying in the shade or wearing long sleeves, sun glasses (UVA+UVB protection) and wide brim hats (4-inch brim around the entire circumference of the hat) are also recommended for sun protection.  - Call for new or changing lesions.  LENTIGINES, SEBORRHEIC KERATOSES, HEMANGIOMAS - Benign normal skin lesions - Benign-appearing - Call  for any changes  MELANOCYTIC NEVI - Tan-brown and/or pink-flesh-colored symmetric macules and papules - Benign appearing on exam today - Observation - Call clinic for new or changing moles - Recommend daily use of broad spectrum spf 30+ sunscreen to sun-exposed areas.    ACNE VULGARIS with scarring  Post treatment of 6 month completion of isotretinoin  03/13/2023 Pt has taken at least 3 courses of Isotretinoin  Exam:  face and back mainly clear, scarring of the upper back, shoulders and temples  Chronic condition with duration or expected duration over one year. Currently well-controlled. Treatment Plan: No treatment at this time.  Pt clear except for scarring.  Cafe au Lait  Right post waistline  - Tan patch - Genetic - Benign, observe - Call for any changes   Vascular birthmark Left anterior thigh  Benign appearing on exam today - Observation - Call clinic for new or changing mole   HISTORY OF DYSPLASTIC NEVUS No evidence of recurrence today Recommend regular full body skin exams Recommend daily broad spectrum sunscreen SPF 30+ to sun-exposed areas, reapply every 2 hours as needed.  Call if any new or changing lesions are noted between office visits   FAMILY HISTORY OF SKIN CANCER What type(s):Melanoma Who affected:mother     NEOPLASM OF SKIN left pectoral periareolar lower outer quad at 4 o'clock - Epidermal / dermal shaving  Lesion diameter (cm):  0.7 Informed consent: discussed and consent obtained   Timeout: patient name, date of birth, surgical site, and procedure verified   Procedure prep:  Patient was prepped and draped in usual sterile fashion Prep type:  Isopropyl alcohol Anesthesia: the lesion was anesthetized in a standard fashion   Anesthetic:  1% lidocaine  w/ epinephrine 1-100,000 buffered w/ 8.4% NaHCO3 Hemostasis achieved with: pressure, aluminum chloride and electrodesiccation   Outcome: patient tolerated procedure well   Post-procedure details:  sterile dressing applied and wound care instructions given   Dressing type: bandage and petrolatum    Specimen 1 - Surgical pathology Differential Diagnosis: R/O Dysplastic nevus   Check Margins: No SKIN CANCER SCREENING   HISTORY OF DYSPLASTIC NEVUS   ACTINIC SKIN DAMAGE   LENTIGO   MELANOCYTIC NEVUS, UNSPECIFIED LOCATION   SCARRING   FAMILY HISTORY OF SKIN CANCER     Return in about 1 year (around 03/17/2025) for TBSE, hx of Dysplastic nevus .  IFay Kirks, CMA, am acting as scribe for Alm Rhyme, MD .   Documentation: I have reviewed the above documentation for accuracy and completeness, and I agree with the above.  Alm Rhyme, MD    "

## 2024-03-18 LAB — TESTOSTERONE, FREE & TOTAL
Free Testosterone: 59.2 pg/mL (ref 35.0–155.0)
Testosterone, Total, LC-MS-MS: 239 ng/dL — ABNORMAL LOW (ref 250–1100)

## 2024-03-19 ENCOUNTER — Ambulatory Visit: Payer: Self-pay | Admitting: Endocrinology

## 2024-03-23 ENCOUNTER — Ambulatory Visit: Payer: Self-pay | Admitting: Dermatology

## 2024-03-23 LAB — SURGICAL PATHOLOGY

## 2024-03-24 ENCOUNTER — Encounter: Payer: Self-pay | Admitting: Dermatology

## 2024-03-24 NOTE — Telephone Encounter (Addendum)
 Called and discussed results with patient. He verbalized understanding and denied further questions.    ----- Message from Alm Rhyme, MD sent at 03/23/2024  6:04 PM EST ----- FINAL DIAGNOSIS        1. Skin, left pectoral periareolar lower outer quad at 4 o'clock :       DYSPLASTIC JUNCTIONAL NEVUS WITH MILD ATYPIA, PERIPHERAL MARGIN INVOLVED    Mild Dysplastic Recheck next visit

## 2024-03-31 ENCOUNTER — Other Ambulatory Visit: Payer: Self-pay | Admitting: Urology

## 2024-09-24 ENCOUNTER — Ambulatory Visit: Admitting: Endocrinology

## 2025-03-17 ENCOUNTER — Ambulatory Visit: Admitting: Dermatology
